# Patient Record
Sex: Female | Born: 1950 | Race: Asian | Hispanic: No | Marital: Married | Smoking: Never smoker
Health system: Southern US, Community
[De-identification: ages and names within clinical notes are randomized; demographics above are authoritative.]

## PROBLEM LIST (undated history)

## (undated) DIAGNOSIS — E119 Type 2 diabetes mellitus without complications: Secondary | ICD-10-CM

## (undated) DIAGNOSIS — Z789 Other specified health status: Secondary | ICD-10-CM

## (undated) HISTORY — DX: Other specified health status: Z78.9

---

## 2003-10-27 ENCOUNTER — Emergency Department (HOSPITAL_COMMUNITY): Admission: EM | Admit: 2003-10-27 | Discharge: 2003-10-27 | Payer: Self-pay | Admitting: Emergency Medicine

## 2003-12-30 ENCOUNTER — Inpatient Hospital Stay (HOSPITAL_COMMUNITY): Admission: AD | Admit: 2003-12-30 | Discharge: 2003-12-30 | Payer: Self-pay | Admitting: Obstetrics and Gynecology

## 2004-01-03 ENCOUNTER — Ambulatory Visit: Payer: Self-pay | Admitting: Obstetrics & Gynecology

## 2013-01-08 ENCOUNTER — Encounter (HOSPITAL_COMMUNITY): Payer: Self-pay | Admitting: Emergency Medicine

## 2013-01-08 ENCOUNTER — Emergency Department (INDEPENDENT_AMBULATORY_CARE_PROVIDER_SITE_OTHER): Payer: 59

## 2013-01-08 ENCOUNTER — Emergency Department (HOSPITAL_COMMUNITY)
Admission: EM | Admit: 2013-01-08 | Discharge: 2013-01-08 | Disposition: A | Discharge status: Home / Self Care | Payer: 59 | Source: Home / Self Care | Attending: Family Medicine | Admitting: Family Medicine

## 2013-01-08 DIAGNOSIS — J40 Bronchitis, not specified as acute or chronic: Secondary | ICD-10-CM

## 2013-01-08 MED ORDER — ALBUTEROL SULFATE (5 MG/ML) 0.5% IN NEBU
INHALATION_SOLUTION | RESPIRATORY_TRACT | Status: AC
  Filled 2013-01-08: qty 1

## 2013-01-08 MED ORDER — IPRATROPIUM BROMIDE 0.02 % IN SOLN
RESPIRATORY_TRACT | Status: AC
  Filled 2013-01-08: qty 2.5

## 2013-01-08 MED ORDER — AZITHROMYCIN 250 MG PO TABS: 250.0000 mg | ORAL_TABLET | Freq: Every day | ORAL | Status: DC

## 2013-01-08 MED ORDER — ALBUTEROL SULFATE (5 MG/ML) 0.5% IN NEBU: 2.5000 mg | INHALATION_SOLUTION | Freq: Once | RESPIRATORY_TRACT | Status: DC

## 2013-01-08 MED ORDER — IPRATROPIUM BROMIDE 0.02 % IN SOLN: 0.5000 mg | Freq: Once | RESPIRATORY_TRACT | Status: DC

## 2013-01-08 NOTE — ED Notes (Signed)
C/o productive cough. Sob. Sore throat. States "hard to swallow".runny nose. Chills.  Denies fever, n/v/d. No relief with otc meds.  On set 2 wks ago.

## 2013-01-08 NOTE — Discharge Instructions (Signed)

## 2013-01-08 NOTE — ED Provider Notes (Signed)
CSN: [id number]     Arrival date & time 01/08/13  7829 History   First MD Initiated Contact with Patient 01/08/13 [phone]     Chief Complaint  Patient presents with  . Influenza   (Consider location/radiation/quality/duration/timing/severity/associated sxs/prior Treatment) Patient is a 62 y.o. female presenting with flu symptoms. The history is provided by the patient. No language interpreter was used.  Influenza Presenting symptoms: cough, fever and myalgias   Severity:  Moderate Onset quality:  Gradual Duration:  3 weeks Progression:  Worsening Chronicity:  New Relieved by:  Nothing Worsened by:  Nothing tried Ineffective treatments:  None tried Associated symptoms: chills     History reviewed. No pertinent past medical history. History reviewed. No pertinent past surgical history. History reviewed. No pertinent family history. History  Substance Use Topics  . Smoking status: Never Smoker   . Smokeless tobacco: Not on file  . Alcohol Use: No   OB History   Grav Para Term Preterm Abortions TAB SAB Ect Mult Living                 Review of Systems  Constitutional: Positive for fever and chills.  Respiratory: Positive for cough.   Musculoskeletal: Positive for myalgias.  All other systems reviewed and are negative.    Allergies  Review of patient's allergies indicates no known allergies.  Home Medications  No current outpatient prescriptions on file. BP 125/74  Pulse 95  Temp(Src) 97.5 F (36.4 C) (Oral)  SpO2 95% Physical Exam  Nursing note and vitals reviewed. Constitutional: She is oriented to person, place, and time. She appears well-developed and well-nourished.  HENT:  Head: Normocephalic.  Right Ear: External ear normal.  Nose: Nose normal.  Mouth/Throat: Oropharynx is clear and moist.  Eyes: Conjunctivae and EOM are normal. Pupils are equal, round, and reactive to light.  Neck: Normal range of motion. Neck supple.  Cardiovascular: Normal rate,  regular rhythm and normal heart sounds.   Pulmonary/Chest: Breath sounds normal. She is in respiratory distress.  Abdominal: Soft.  Musculoskeletal: Normal range of motion.  Neurological: She is alert and oriented to person, place, and time. She has normal reflexes.  Skin: Skin is warm.  Psychiatric: She has a normal mood and affect.    ED Course  Procedures (including critical care time) Labs Review Labs Reviewed - No data to display Imaging Review Dg Chest 2 View  01/08/2013   CLINICAL DATA:  Cough, congestion.  EXAM: CHEST  2 VIEW  COMPARISON:  None.  FINDINGS: The heart size and mediastinal contours are within normal limits. Both lungs are clear. No pneumothorax or pleural effusion is noted. The visualized skeletal structures are unremarkable.  IMPRESSION: No active cardiopulmonary disease.   Electronically Signed   By: Roque Lias M.D.   On: 01/08/2013 10:02    EKG Interpretation    Date/Time:    Ventricular Rate:    PR Interval:    QRS Duration:   QT Interval:    QTC Calculation:   R Axis:     Text Interpretation:              MDM   1. Bronchitis    Pt's chest xray is normal.  I will treat for bronchitis.  Pt given zithromax.   I advised primary care follow up   Elson Areas, PA-C 01/08/13 1016

## 2013-01-09 NOTE — ED Provider Notes (Signed)
Medical screening examination/treatment/procedure(s) were performed by a resident physician or non-physician practitioner and as the supervising physician I was immediately available for consultation/collaboration.  Clementeen Graham, MD   Rodolph Bong, MD 01/09/13 [phone]

## 2014-09-17 ENCOUNTER — Ambulatory Visit (INDEPENDENT_AMBULATORY_CARE_PROVIDER_SITE_OTHER): Payer: 59 | Admitting: Internal Medicine

## 2014-09-17 VITALS — BP 122/70 | HR 73 | Temp 98.2°F | Ht 58.5 in | Wt 118.6 lb

## 2014-09-17 DIAGNOSIS — M79675 Pain in left toe(s): Secondary | ICD-10-CM | POA: Diagnosis not present

## 2014-09-17 DIAGNOSIS — B351 Tinea unguium: Secondary | ICD-10-CM

## 2014-09-17 DIAGNOSIS — L409 Psoriasis, unspecified: Secondary | ICD-10-CM

## 2014-09-17 DIAGNOSIS — M79674 Pain in right toe(s): Secondary | ICD-10-CM

## 2014-09-17 LAB — COMPREHENSIVE METABOLIC PANEL
ALT: 9 U/L (ref 6–29)
AST: 17 U/L (ref 10–35)
Albumin: 4 g/dL (ref 3.6–5.1)
Alkaline Phosphatase: 69 U/L (ref 33–130)
BUN: 12 mg/dL (ref 7–25)
CHLORIDE: 105 mmol/L (ref 98–110)
CO2: 26 mmol/L (ref 20–31)
Calcium: 9 mg/dL (ref 8.6–10.4)
Creat: 0.74 mg/dL (ref 0.50–0.99)
Glucose, Bld: 87 mg/dL (ref 65–99)
POTASSIUM: 3.7 mmol/L (ref 3.5–5.3)
Sodium: 140 mmol/L (ref 135–146)
TOTAL PROTEIN: 7.1 g/dL (ref 6.1–8.1)
Total Bilirubin: 0.7 mg/dL (ref 0.2–1.2)

## 2014-09-17 LAB — POCT CBC
GRANULOCYTE PERCENT: 60.3 % (ref 37–80)
HEMATOCRIT: 41.9 % (ref 37.7–47.9)
Hemoglobin: 12.9 g/dL (ref 12.2–16.2)
Lymph, poc: 1.6 (ref 0.6–3.4)
MCH, POC: 25.6 pg — AB (ref 27–31.2)
MCHC: 30.9 g/dL — AB (ref 31.8–35.4)
MCV: 82.9 fL (ref 80–97)
MID (cbc): 0.4 (ref 0–0.9)
MPV: 6.9 fL (ref 0–99.8)
POC Granulocyte: 3.1 (ref 2–6.9)
POC LYMPH PERCENT: 30.9 %L (ref 10–50)
POC MID %: 8.8 %M (ref 0–12)
Platelet Count, POC: 234 10*3/uL (ref 142–424)
RBC: 5.05 M/uL (ref 4.04–5.48)
RDW, POC: 14.7 %
WBC: 5.1 10*3/uL (ref 4.6–10.2)

## 2014-09-17 LAB — POCT SEDIMENTATION RATE: POCT SED RATE: 51 mm/h — AB (ref 0–22)

## 2014-09-17 LAB — GLUCOSE, POCT (MANUAL RESULT ENTRY): POC Glucose: 83 mg/dl (ref 70–99)

## 2014-09-17 LAB — TSH: TSH: 2.784 u[IU]/mL (ref 0.350–4.500)

## 2014-09-17 MED ORDER — FLUOCINOLONE ACETONIDE 0.01 % EX SHAM: MEDICATED_SHAMPOO | CUTANEOUS | Status: DC

## 2014-09-17 MED ORDER — CLOBETASOL PROPIONATE 0.05 % EX CREA: 1.0000 "application " | TOPICAL_CREAM | Freq: Two times a day (BID) | CUTANEOUS | Status: DC

## 2014-09-17 MED ORDER — TERBINAFINE HCL 250 MG PO TABS: 250.0000 mg | ORAL_TABLET | Freq: Every day | ORAL | Status: DC

## 2014-09-17 NOTE — Progress Notes (Signed)
Subjective:  This chart was scribed for Jessica Lin MD, by Jessica Gregory, at [facility].  This patient was seen in room 10 and the patient's care was started at 9:42 AM.    Patient ID: Jessica Gregory, female    DOB: 1950/12/05, 64 y.o.   MRN: [id number]  Chief Complaint  Patient presents with  . Nail Problem    c/o toenail pain and fungus in the nails for several years  . Rash    x's 1 year that itches (patches) in multiple places    HPI  HPI Comments: Jessica Gregory is a 64 y.o. female who presents to the [facility] complaining of a rash on her back, thighs and lower portion of her legs onset 1 year ago.  She is also complaining of toe pain due to her toe finger nails growing upwards.   Patient is not able to wear tennis shoes as it gives her extreme pain.  She was not able to come in to see a physicain earlier due to lack of insurance and has not seen a provider in over 10 years.  She has now chosen Dr. Edilia Gregory as her PCP and will be seeing her in the coming months.   Patient has an interpreter present with her today.     There are no active problems to display for this patient.  No past medical history on file. No past surgical history on file. No Known Allergies Prior to Admission medications   Medication Sig Start Date End Date Taking? Authorizing Provider  azithromycin (ZITHROMAX) 250 MG tablet Take 1 tablet (250 mg total) by mouth daily. Take first 2 tablets together, then 1 every day until finished. Patient not taking: Reported on 09/17/2014 01/08/13   Jessica Meadow, PA-C   Social History   Social History  . Marital Status: Married    Spouse Name: N/A  . Number of Children: N/A  . Years of Education: N/A   Occupational History  . Not on file.   Social History Main Topics  . Smoking status: Never Smoker   . Smokeless tobacco: Not on file  . Alcohol Use: No  . Drug Use: No  . Sexual Activity: No   Other Topics  Concern  . Not on file   Social History Narrative     Review of Systems  Constitutional: Negative for fever and chills.  Respiratory: Negative for cough, choking and shortness of breath.   Musculoskeletal: Negative for neck pain and neck stiffness.  Skin: Positive for rash.       Objective:   Physical Exam  Constitutional: She appears well-developed and well-nourished. No distress.  HENT:  Head: Normocephalic and atraumatic.  Eyes: Conjunctivae and EOM are normal. Pupils are equal, round, and reactive to light.  Neck: Normal range of motion. Neck supple. No thyromegaly present.  Cardiovascular: Normal rate, regular rhythm and normal heart sounds.   No murmur heard. Pulmonary/Chest: Effort normal and breath sounds normal. No respiratory distress.  Skin: Skin is warm and dry.  She has nail deformities on right 1,2 and 4 and left 1,3 and 4 , leuconychia , all the nails on the toes are destroyed and thickened.   She has five or six large circular lesions three to five cm diameter with sharply demarcated borders and raised scale like silver surfaces.      Psychiatric: She has a normal mood and affect. Her behavior is normal.  Nursing note and vitals  reviewed.  Filed Vitals:   09/17/14 0919  BP: 122/70  Pulse: 73  Temp: 98.2 F (36.8 C)  TempSrc: Oral  Height: 4' 10.5" (1.486 m)  Weight: 118 lb 9.6 oz (53.797 kg)  SpO2: 96%   Results for orders placed or performed in visit on 09/17/14  POCT CBC  Result Value Ref Range   WBC 5.1 4.6 - 10.2 K/uL   Lymph, poc 1.6 0.6 - 3.4   POC LYMPH PERCENT 30.9 10 - 50 %L   MID (cbc) 0.4 0 - 0.9   POC MID % 8.8 0 - 12 %M   POC Granulocyte 3.1 2 - 6.9   Granulocyte percent 60.3 37 - 80 %G   RBC 5.05 4.04 - 5.48 M/uL   Hemoglobin 12.9 12.2 - 16.2 g/dL   HCT, POC 41.9 37.7 - 47.9 %   MCV 82.9 80 - 97 fL   MCH, POC 25.6 (A) 27 - 31.2 pg   MCHC 30.9 (A) 31.8 - 35.4 g/dL   RDW, POC 14.7 %   Platelet Count, POC 234 142 - 424 K/uL    MPV 6.9 0 - 99.8 fL  POCT glucose (manual entry)  Result Value Ref Range   POC Glucose 83 70 - 99 mg/dl          Assessment & Plan:  I have completed the patient encounter in its entirety as documented by the scribe, with editing by me where necessary. Jessica Gregory P. Laney Pastor, M.D.   Onychomycosis due to dermatophyte with pain - Plan: Lamisil//ref podiatry  Psoriasis - Plan: steroids  Meds ordered this encounter  Medications  . clobetasol cream (TEMOVATE) 0.05 %    Sig: Apply 1 application topically 2 (two) times daily.    Dispense:  30 g    Refill:  1  . terbinafine (LAMISIL) 250 MG tablet    Sig: Take 1 tablet (250 mg total) by mouth daily.    Dispense:  30 tablet    Refill:  2  . Fluocinolone Acetonide 0.01 % SHAM    Sig: Use as shampoo twice a week    Dispense:  120 mL    Refill:  0   Ref podiatry ESR 51 CMET wnl TSH wnl F/u for CPE as planned

## 2014-09-18 NOTE — Addendum Note (Signed)
Addended by: Leandrew Koyanagi on: 09/18/2014 09:18 PM   Modules accepted: Level of Service

## 2014-10-21 ENCOUNTER — Ambulatory Visit (INDEPENDENT_AMBULATORY_CARE_PROVIDER_SITE_OTHER): Payer: 59 | Admitting: Podiatry

## 2014-10-21 ENCOUNTER — Encounter: Payer: Self-pay | Admitting: Podiatry

## 2014-10-21 ENCOUNTER — Ambulatory Visit (INDEPENDENT_AMBULATORY_CARE_PROVIDER_SITE_OTHER): Payer: 59

## 2014-10-21 VITALS — BP 162/97 | HR 72 | Resp 18

## 2014-10-21 DIAGNOSIS — B351 Tinea unguium: Secondary | ICD-10-CM

## 2014-10-21 DIAGNOSIS — S92002A Unspecified fracture of left calcaneus, initial encounter for closed fracture: Secondary | ICD-10-CM

## 2014-10-21 DIAGNOSIS — M79672 Pain in left foot: Secondary | ICD-10-CM | POA: Diagnosis not present

## 2014-10-21 DIAGNOSIS — R52 Pain, unspecified: Secondary | ICD-10-CM | POA: Diagnosis not present

## 2014-10-21 DIAGNOSIS — Z79899 Other long term (current) drug therapy: Secondary | ICD-10-CM

## 2014-10-21 NOTE — Patient Instructions (Signed)
Over the counter medication for nail fungus is called fungi-nail  Terbinafine oral granules What is this medicine? TERBINAFINE (TER bin a feen) is an antifungal medicine. It is used to treat certain kinds of fungal or yeast infections. This medicine may be used for other purposes; ask your health care provider or pharmacist if you have questions. COMMON BRAND NAME(S): Lamisil What should I tell my health care provider before I take this medicine? They need to know if you have any of these conditions: -drink alcoholic beverages -kidney disease -liver disease -an unusual or allergic reaction to Terbinafine, other medicines, foods, dyes, or preservatives -pregnant or trying to get pregnant -breast-feeding How should I use this medicine? Take this medicine by mouth. Follow the directions on the prescription label. Hold packet with cut line on top. Shake packet gently to settle contents. Tear packet open along cut line, or use scissors to cut across line. Carefully pour the entire contents of packet onto a spoonful of a soft food, such as pudding or other soft, non-acidic food such as mashed potatoes (do NOT use applesauce or a fruit-based food). If two packets are required for each dose, you may either sprinkle the content of both packets on one spoonful of non-acidic food, or sprinkle the contents of both packets on two spoonfuls of non-acidic food. Make sure that no granules remain in the packet. Swallow the mxiture of the food and granules without chewing. Take your medicine at regular intervals. Do not take it more often than directed. Take all of your medicine as directed even if you think you are better. Do not skip doses or stop your medicine early. Contact your pediatrician or health care professional regarding the use of this medicine in children. While this medicine may be prescribed for children as young as 4 years for selected conditions, precautions do apply. Overdosage: If you think you  have taken too much of this medicine contact a poison control center or emergency room at once. NOTE: This medicine is only for you. Do not share this medicine with others. What if I miss a dose? If you miss a dose, take it as soon as you can. If it is almost time for your next dose, take only that dose. Do not take double or extra doses. What may interact with this medicine? Do not take this medicine with any of the following medications: -thioridazine This medicine may also interact with the following medications: -beta-blockers -caffeine -cimetidine -cyclosporine -MAOIs like Carbex, Eldepryl, Marplan, Nardil, and Parnate -medicines for fungal infections like fluconazole and ketoconazole -medicines for irregular heartbeat like amiodarone, flecainide and propafenone -rifampin -SSRIs like citalopram, escitalopram, fluoxetine, fluvoxamine, paroxetine and sertraline -tricyclic antidepressants like amitriptyline, clomipramine, desipramine, imipramine, nortriptyline, and others -warfarin This list may not describe all possible interactions. Give your health care provider a list of all the medicines, herbs, non-prescription drugs, or dietary supplements you use. Also tell them if you smoke, drink alcohol, or use illegal drugs. Some items may interact with your medicine. What should I watch for while using this medicine? Your doctor may monitor your liver function. Tell your doctor right away if you have nausea or vomiting, loss of appetite, stomach pain on your right upper side, yellow skin, dark urine, light stools, or are over tired. You need to take this medicine for 6 weeks or longer to cure the fungal infection. Take your medicine regularly for as long as your doctor or health care professional tells you to. What side effects may I  notice from receiving this medicine? Side effects that you should report to your doctor or health care professional as soon as possible: -allergic reactions like  skin rash or hives, swelling of the face, lips, or tongue -change in vision -dark urine -fever or infection -general ill feeling or flu-like symptoms -light-colored stools -loss of appetite, nausea -redness, blistering, peeling or loosening of the skin, including inside the mouth -right upper belly pain -unusually weak or tired -yellowing of the eyes or skin Side effects that usually do not require medical attention (report to your doctor or health care professional if they continue or are bothersome): -changes in taste -diarrhea -hair loss -muscle or joint pain -stomach upset This list may not describe all possible side effects. Call your doctor for medical advice about side effects. You may report side effects to FDA at 1-800-FDA-1088. Where should I keep my medicine? Keep out of the reach of children. Store at room temperature between 15 and 30 degrees C (59 and 86 degrees F). Throw away any unused medicine after the expiration date. NOTE: This sheet is a summary. It may not cover all possible information. If you have questions about this medicine, talk to your doctor, pharmacist, or health care provider.  2015, Elsevier/Gold Standard. (2007-03-27 17:25:48)   Stress Fracture When too much stress is put on the foot, as in running and jumping sports, the center shaft of the bones of the forefoot is very susceptible to stress fractures (break in bone). This is because of repetitive stress on the bone. This injury is more common if osteoporosis is present or if inadequate running shoes are used. Rapid increase in running distances are often the cause. Running distances should be gradually increased to avoid this problem. Shoes should be used which adequately cushion the foot. Shoes should absorb the shocks of the activity.  DIAGNOSIS  Usually the diagnosis is made by history. The foot progressively becomes sorer with activities. X-rays may be negative (show no break) within the first 2 to  3 weeks of the beginning of pain. A later X-ray may show signs of healing bone (callus formation). A bone scan or MRI will usually make the diagnosis earlier. TREATMENT AND HOME CARE INSTRUCTIONS  Treatment may or may not include a cast, removable fracture boot, or walking shoe. Casts are used for short periods of time to prevent muscle atrophy (muscle wasting).  Activities should be stopped until further advised by your caregiver.  Wear shoes with adequate shock absorbing abilities.  Alternative exercise may be undertaken while waiting for healing. These may include bicycling and swimming, or as your caregiver suggests. If you do not have a cast or splint:  You may walk on your injured foot as tolerated or advised.  Do not put any weight on your injured foot for as long as directed by your caregiver. Slowly increase the amount of time you walk on the foot as the pain allows or as advised.  Use crutches until you can bear weight without pain. A gradual increase in weight bearing may help.  Apply ice to the injury for 15-20 minutes each hour while awake for the first 2 days. Put the ice in a plastic bag and place a towel between the bag of ice and your skin.  Only take over-the-counter or prescription medicines for pain, discomfort, or fever as directed by your caregiver. SEEK IMMEDIATE MEDICAL CARE IF:   Pain is becoming worse rather than better, or if pain is uncontrolled with medications.  You  have increased swelling or redness in the foot. MAKE SURE YOU:   Understand these instructions.  Will watch your condition.  Will get help right away if you are not doing well or get worse. Document Released: 01/12/2000 Document Revised: 04/08/2011 Document Reviewed: 11/10/2007 [facility] Patient Information 2015 [place], [place]. This information is not intended to replace advice given to you by your health care provider. Make sure you discuss any questions you have with your health care  provider.

## 2014-10-21 NOTE — Progress Notes (Signed)
Subjective:    Patient ID: Jessica Gregory, female    DOB: 1950/05/05, 64 y.o.   MRN: [id number]  HPI  64 year old female presents the office with concerns of thick, discolored toenails which has been present for several years. She recently went to urgent care and was prescribed Lamisil approximate one month ago. She is always finish her first month of treatment. She does not know dose any side effects the medication however she has not noticed any change in nails either. Denies any pain to the toenails at this time although they do become painful with shoe gear. Denies any redness or drainage.  Chest states that she's had some swelling to her feet mostly on the left heel. She works on her feet all day. She states that she has pain with weightbearing to her left heel. She appears to have pain in her right heel but now is mostly over the left side. She denies any recent injury or trauma. Denies any numbness or tingling. She said no prior treatment. No other complaints at this time.    Review of Systems  Eyes: Positive for redness, itching and visual disturbance.  Respiratory: Positive for cough.   Gastrointestinal: Positive for constipation.  Musculoskeletal: Positive for gait problem.  Skin: Positive for rash.       Change in nails and thick scars  Neurological: Positive for light-headedness.  Hematological: Bruises/bleeds easily.  All other systems reviewed and are negative.      Objective:   Physical Exam AAO x3, NAD; presents with daughter who translates. DP/PT pulses palpable bilaterally, CRT less than 3 seconds Protective sensation intact with Simms Weinstein monofilament, vibratory sensation intact, Achilles tendon reflex intact Nails are hypertrophic, dystrophic, discolored, brittle, elongated 10. There is no swelling erythema or drainage. There is mild tenderness to palpation upon the nails 10. There is tenderness to palpation on the lateral aspect of the left calcaneus and  there is pain with lateral compression of the calcaneus. There is pain with vibratory sensation overlying this area as well. There is localized edema overlying the lateral portion of the calcaneus on the left side. There is no assistive erythema or increase in warmth. There is no defect noted or any step-offs. There is no pain on the plantar aspect of the calcaneus or along the course of the plantar fascia at this time. There is no pain of the right foot/ankle this time. No edema to the right foot. No other areas of tenderness to bilateral lower extremities. MMT 5/5, ROM WNL.  No open lesions or pre-ulcerative lesions.  No overlying edema, erythema, increase in warmth to bilateral lower extremities.  No pain with calf compression, swelling, warmth, erythema bilaterally.       Assessment & Plan:  64 year old female with left heel pain, likely calcaneal fracture/stress fracture; onychomycosis -X-rays were obtained and reviewed with the patient. On the left side there does appear to be a radiolucent line within the posterior tubercle of the calcaneus concerning for fracture. Given that she has pinpoint tenderness overlying this area with pain with vibratory sensation was treated for fracture. Cam boot dispensed for mobilization. Ice and elevation. Wear boot at all times. - In regards to onychomycosis continue with Lamisil. Over bloodwork for LFT and CBC w/diff to have done within the next 2 weeks. I encouraged her to get this blood work done for continuation of treatment with Lamisil. Discussed side effects of the medication directed to call the office should any occur. -Follow-up 3 weeks or sooner  if any problems arise. In the meantime, encouraged to call the office with any questions, concerns, change in symptoms.   Celesta Gentile, DPM

## 2014-10-26 ENCOUNTER — Telehealth: Payer: Self-pay | Admitting: Family Medicine

## 2014-10-26 NOTE — Telephone Encounter (Signed)
Left a message for patient to return call to schedule a mammogram. 

## 2014-11-09 ENCOUNTER — Ambulatory Visit: Payer: 59 | Admitting: Family Medicine

## 2014-11-16 ENCOUNTER — Ambulatory Visit (INDEPENDENT_AMBULATORY_CARE_PROVIDER_SITE_OTHER): Payer: 59 | Admitting: Family Medicine

## 2014-11-16 ENCOUNTER — Encounter: Payer: Self-pay | Admitting: Family Medicine

## 2014-11-16 VITALS — BP 143/83 | HR 72 | Temp 97.8°F | Resp 16 | Wt 118.2 lb

## 2014-11-16 DIAGNOSIS — Z13220 Encounter for screening for lipoid disorders: Secondary | ICD-10-CM

## 2014-11-16 DIAGNOSIS — Z1239 Encounter for other screening for malignant neoplasm of breast: Secondary | ICD-10-CM

## 2014-11-16 DIAGNOSIS — Z7689 Persons encountering health services in other specified circumstances: Secondary | ICD-10-CM

## 2014-11-16 DIAGNOSIS — Z789 Other specified health status: Secondary | ICD-10-CM

## 2014-11-16 DIAGNOSIS — L409 Psoriasis, unspecified: Secondary | ICD-10-CM | POA: Diagnosis not present

## 2014-11-16 DIAGNOSIS — Z131 Encounter for screening for diabetes mellitus: Secondary | ICD-10-CM | POA: Diagnosis not present

## 2014-11-16 DIAGNOSIS — Z7189 Other specified counseling: Secondary | ICD-10-CM | POA: Diagnosis not present

## 2014-11-16 DIAGNOSIS — Z5181 Encounter for therapeutic drug level monitoring: Secondary | ICD-10-CM

## 2014-11-16 DIAGNOSIS — Z23 Encounter for immunization: Secondary | ICD-10-CM

## 2014-11-16 DIAGNOSIS — Z603 Acculturation difficulty: Secondary | ICD-10-CM | POA: Insufficient documentation

## 2014-11-16 DIAGNOSIS — Z119 Encounter for screening for infectious and parasitic diseases, unspecified: Secondary | ICD-10-CM

## 2014-11-16 DIAGNOSIS — R894 Abnormal immunological findings in specimens from other organs, systems and tissues: Secondary | ICD-10-CM

## 2014-11-16 DIAGNOSIS — R768 Other specified abnormal immunological findings in serum: Secondary | ICD-10-CM

## 2014-11-16 HISTORY — DX: Other specified health status: Z78.9

## 2014-11-16 HISTORY — DX: Acculturation difficulty: Z60.3

## 2014-11-16 LAB — LIPID PANEL
Cholesterol: 193 mg/dL (ref 125–200)
HDL: 43 mg/dL — ABNORMAL LOW (ref >=46)
LDL Cholesterol: 134 mg/dL — ABNORMAL HIGH (ref <130)
Total CHOL/HDL Ratio: 4.5 Ratio (ref <=5.0)
Triglycerides: 80 mg/dL (ref <150)
VLDL: 16 mg/dL (ref <30)

## 2014-11-16 LAB — HEPATIC FUNCTION PANEL
ALBUMIN: 3.8 g/dL (ref 3.6–5.1)
ALT: 10 U/L (ref 6–29)
AST: 16 U/L (ref 10–35)
Alkaline Phosphatase: 74 U/L (ref 33–130)
Bilirubin, Direct: 0.1 mg/dL (ref <=0.2)
Indirect Bilirubin: 0.2 mg/dL (ref 0.2–1.2)
Total Bilirubin: 0.3 mg/dL (ref 0.2–1.2)
Total Protein: 7.3 g/dL (ref 6.1–8.1)

## 2014-11-16 LAB — HEMOGLOBIN A1C
Hgb A1c MFr Bld: 6.5 % — ABNORMAL HIGH (ref <5.7)
MEAN PLASMA GLUCOSE: 140 mg/dL — AB (ref <117)

## 2014-11-16 MED ORDER — CLOBETASOL PROPIONATE 0.05 % EX CREA: 1.0000 "application " | TOPICAL_CREAM | Freq: Two times a day (BID) | CUTANEOUS | Status: DC

## 2014-11-16 NOTE — Patient Instructions (Addendum)
I will be in touch in your labs asap You got your flu shot and tetanus shot today I would recommend that you wear the fracture boot as Dr. Jacqualyn Posey ordered to help your left heel bone heal Please see me for a physical in 4-6 months  I will set up a mammogram for you- I do recommend that you have this screening for breast cancer  Results for orders placed or performed in visit on 09/17/14  Comprehensive metabolic panel  Result Value Ref Range   Sodium 140 135 - 146 mmol/L   Potassium 3.7 3.5 - 5.3 mmol/L   Chloride 105 98 - 110 mmol/L   CO2 26 20 - 31 mmol/L   Glucose, Bld 87 65 - 99 mg/dL   BUN 12 7 - 25 mg/dL   Creat 0.74 0.50 - 0.99 mg/dL   Total Bilirubin 0.7 0.2 - 1.2 mg/dL   Alkaline Phosphatase 69 33 - 130 U/L   AST 17 10 - 35 U/L   ALT 9 6 - 29 U/L   Total Protein 7.1 6.1 - 8.1 g/dL   Albumin 4.0 3.6 - 5.1 g/dL   Calcium 9.0 8.6 - 10.4 mg/dL  TSH  Result Value Ref Range   TSH 2.784 0.350 - 4.500 uIU/mL  POCT CBC  Result Value Ref Range   WBC 5.1 4.6 - 10.2 K/uL   Lymph, poc 1.6 0.6 - 3.4   POC LYMPH PERCENT 30.9 10 - 50 %L   MID (cbc) 0.4 0 - 0.9   POC MID % 8.8 0 - 12 %M   POC Granulocyte 3.1 2 - 6.9   Granulocyte percent 60.3 37 - 80 %G   RBC 5.05 4.04 - 5.48 M/uL   Hemoglobin 12.9 12.2 - 16.2 g/dL   HCT, POC 41.9 37.7 - 47.9 %   MCV 82.9 80 - 97 fL   MCH, POC 25.6 (A) 27 - 31.2 pg   MCHC 30.9 (A) 31.8 - 35.4 g/dL   RDW, POC 14.7 %   Platelet Count, POC 234 142 - 424 K/uL   MPV 6.9 0 - 99.8 fL  POCT glucose (manual entry)  Result Value Ref Range   POC Glucose 83 70 - 99 mg/dl  POCT SEDIMENTATION RATE  Result Value Ref Range   POCT SED RATE 51 (A) 0 - 22 mm/hr

## 2014-11-16 NOTE — Progress Notes (Addendum)
[facility] [address], [place] [address] [phone]  Date:  11/16/2014   Name:  Jessica Gregory   DOB:  09-Dec-1950   MRN:  [id number]  PCP:  Lamar Blinks, MD    Chief Complaint: establish care with Dr Lorelei Pont; bloodwork; and foot fungus   History of Present Illness:  Jessica Gregory is a 64 y.o. very pleasant female patient who presents with the following:  She was here a couple of month ago with complaint of toenail fungus and rash.  She was started on lamisil and was referred to podiatry. She feels like her toenails are a little bit better- they have seen podiatry and was dx with a stress fracture.  She was given a boot to wear, but per her daughter she does not tend to use the boot much as it is awkward when she is working   Her today with "Jessica Gregory)," her daughter.    She would like to have a tetanus shot today- they do not know when her last shot may have been  She has not been seeing doctors for a few years for regular care.   She is fasting today for labs- did have some labs done per Dr. Laney Pastor in August She also has some scattered plaque psoriasis- she has been using clobetasol with good results.     There are no active problems to display for this patient.   No past medical history on file.  No past surgical history on file.  Social History  Substance Use Topics  . Smoking status: Never Smoker   . Smokeless tobacco: None  . Alcohol Use: No    No family history on file.  No Known Allergies  Medication list has been reviewed and updated.  Current Outpatient Prescriptions on File Prior to Visit  Medication Sig Dispense Refill  . clobetasol cream (TEMOVATE) 4.81 % Apply 1 application topically 2 (two) times daily. 30 g 1  . Fluocinolone Acetonide 0.01 % SHAM Use as shampoo twice a week 120 mL 0  . terbinafine (LAMISIL) 250 MG tablet Take 1 tablet (250 mg total) by mouth daily. 30 tablet 2   No current facility-administered  medications on file prior to visit.    Review of Systems:  As per HPI- otherwise negative.   Physical Examination: Filed Vitals:   11/16/14 1035  BP: 143/83  Pulse: 72  Temp: 97.8 F (36.6 C)  Resp: 16   Filed Vitals:   11/16/14 1035  Weight: 118 lb 3.2 oz (53.615 kg)   Body mass index is 24.28 kg/(m^2). Ideal Body Weight:    GEN: WDWN, NAD, Non-toxic, A & O x 3 HEENT: Atraumatic, Normocephalic. Neck supple. No masses, No LAD. Ears and Nose: No external deformity. CV: RRR, No M/G/R. No JVD. No thrill. No extra heart sounds. PULM: CTA B, no wheezes, crackles, rhonchi. No retractions. No resp. distress. No accessory muscle use. ABD: S, NT, ND, +BS. No rebound. No HSM. EXTR: No c/c/e NEURO Normal gait.  PSYCH: Normally interactive. Conversant. Not depressed or anxious appearing.  Calm demeanor.  She has thickened, fungal appearing nails on most of her toes. Also many of her fingernails show evidence of onycholysis.   She has a few discrete, silvery scaled plaques on her body typical of psoriasis  BP Readings from Last 3 Encounters:  11/16/14 143/83  10/21/14 162/97  09/17/14 122/70     Assessment and Plan: Establishing care with new doctor, encounter for  Need for  prophylactic vaccination and inoculation against influenza - Plan: Flu Vaccine QUAD 36+ mos IM  Screening for breast cancer - Plan: MM Digital Screening  Screening for hyperlipidemia - Plan: Lipid panel  Screening for diabetes mellitus - Plan: Hemoglobin A1c  Immunization due - Plan: Tdap vaccine greater than or equal to 7yo IM  Screening examination for infectious disease - Plan: HIV antibody, Hepatitis panel, acute  Psoriasis - Plan: clobetasol cream (TEMOVATE) 0.05 %  tdap and flu shots today Cholesterol, diabetes and hepatitis, HIV screening today Refilled her clobetasol for psoriasis Her BP is slightly high today but was normal at last check here Continue lamisil for her onycholysis and  onychomycosis.   She apparently has a stress fracture in her left heel.  Recommended that she use the CAM boot as recommended by her podiatrist.  She has follow-up with him this month  RTC for CPE soon  Signed Lamar Blinks, MD  Called on 10/23- spoke with her daughter Jessica.  Her mom may have a chronic Hep B infection or may have had this in the past and cleared it.  She will bring her in for a follow-up blood draw in the next couple of weeks Cholesterol and liver function are reasonable.  However she does just barely have DM.  Encouraged her to work on exercise and a diet with more veggies and protein, fewer carbs, recheck in 3 months.   Results for orders placed or performed in visit on 11/16/14  Lipid panel  Result Value Ref Range   Cholesterol 193 125 - 200 mg/dL   Triglycerides 80 <150 mg/dL   HDL 43 (L) >=46 mg/dL   Total CHOL/HDL Ratio 4.5 <=5.0 Ratio   VLDL 16 <30 mg/dL   LDL Cholesterol 134 (H) <130 mg/dL  Hemoglobin A1c  Result Value Ref Range   Hgb A1c MFr Bld 6.5 (H) <5.7 %   Mean Plasma Glucose 140 (H) <117 mg/dL  HIV antibody  Result Value Ref Range   HIV 1&2 Ab, 4th Generation NONREACTIVE NONREACTIVE  Hepatitis panel, acute  Result Value Ref Range   Hepatitis B Surface Ag POSITIVE (A) NEGATIVE   HCV Ab NEGATIVE NEGATIVE   Hep B C IgM NON REACTIVE NON REACTIVE   Hep A IgM NON REACTIVE NON REACTIVE  Hepatic Function Panel  Result Value Ref Range   Total Bilirubin 0.3 0.2 - 1.2 mg/dL   Bilirubin, Direct 0.1 <=0.2 mg/dL   Indirect Bilirubin 0.2 0.2 - 1.2 mg/dL   Alkaline Phosphatase 74 33 - 130 U/L   AST 16 10 - 35 U/L   ALT 10 6 - 29 U/L   Total Protein 7.3 6.1 - 8.1 g/dL   Albumin 3.8 3.6 - 5.1 g/dL  Hepatitis B Surf Ag Confirmation  Result Value Ref Range   Hepatitis B Surf Ag Confirmation POS (A) NEGATIVE

## 2014-11-17 LAB — HIV ANTIBODY (ROUTINE TESTING W REFLEX): HIV: NONREACTIVE

## 2014-11-17 LAB — HEPATITIS PANEL, ACUTE
HCV Ab: NEGATIVE
Hep A IgM: NONREACTIVE
Hep B C IgM: NONREACTIVE
Hepatitis B Surface Ag: POSITIVE — AB

## 2014-11-17 LAB — HEPATITIS B SURF AG CONFIRMATION: Hepatitis B Surf Ag Confirmation: POSITIVE — AB

## 2014-11-20 ENCOUNTER — Encounter: Payer: Self-pay | Admitting: Family Medicine

## 2014-11-20 NOTE — Addendum Note (Signed)
Addended by: Lamar Blinks C on: 11/20/2014 03:25 PM   Modules accepted: Orders

## 2014-11-21 ENCOUNTER — Encounter: Payer: Self-pay | Admitting: Family Medicine

## 2014-11-21 ENCOUNTER — Encounter: Payer: Self-pay | Admitting: Podiatry

## 2014-11-21 ENCOUNTER — Ambulatory Visit (INDEPENDENT_AMBULATORY_CARE_PROVIDER_SITE_OTHER): Payer: 59 | Admitting: Podiatry

## 2014-11-21 ENCOUNTER — Ambulatory Visit (INDEPENDENT_AMBULATORY_CARE_PROVIDER_SITE_OTHER): Payer: 59

## 2014-11-21 DIAGNOSIS — S92002D Unspecified fracture of left calcaneus, subsequent encounter for fracture with routine healing: Secondary | ICD-10-CM

## 2014-11-21 DIAGNOSIS — M79672 Pain in left foot: Secondary | ICD-10-CM

## 2014-11-21 DIAGNOSIS — M722 Plantar fascial fibromatosis: Secondary | ICD-10-CM | POA: Diagnosis not present

## 2014-11-21 DIAGNOSIS — B351 Tinea unguium: Secondary | ICD-10-CM | POA: Diagnosis not present

## 2014-11-21 DIAGNOSIS — E1165 Type 2 diabetes mellitus with hyperglycemia: Secondary | ICD-10-CM | POA: Insufficient documentation

## 2014-11-21 DIAGNOSIS — IMO0002 Reserved for concepts with insufficient information to code with codable children: Secondary | ICD-10-CM | POA: Insufficient documentation

## 2014-11-21 NOTE — Patient Instructions (Signed)

## 2014-11-21 NOTE — Progress Notes (Signed)
Patient ID: Jessica Gregory, female   DOB: November 05, 1950, 64 y.o.   MRN: [id number]  Subjective: Patient presents the office today for evaluation of possible stress fracture the left calcaneus. She states that she continues with a cam boot. She does continue to have some pain around the area in the same spot she did previously. The majority of her pain is the outside aspect of the heel, side. She does is difficult to work in the boot although she does. She stands for long periods of time at work. She denies any swelling or redness to the area. Denies any redness or tingling. She is also continue with Lamisil and she has had blood work on her primary care physician's office. No other complaints at this time. Denies side effects of Lamisil.  Objective: AAO 3, NAD DP/PT 2/4, CRT less than 3 seconds There is continuation tenderness to lateral aspect of the left calcaneus and there is tenderness directly upon the calcaneus. There can be pain with vibratory sensation along the lateral aspect of the calcaneus. There is no pain on the plantar aspect of the calcaneus on the plantar fascia this time. There is no pain on the course of the Achilles tendon. There is mild discomfort on the plantar medial tubercle of the calcaneus at the insertion of the plantar fashion on the right side. There is no pain lateral compression of the calcaneus and the right side. No pain on the Achilles tendon. Nails continue be hypertrophic, dystrophic, discolored, brittle. There is no tenderness palpation on the nails 1-5 bilaterally. There is no surrounding erythema or drainage. No other areas of tenderness to bilateral lower extremity. There is no pain with calf compression, swelling, warmth, erythema.  Assessment: 64 year old female with possible left calcaneal fracture/stress fracture, right plantar fasciitis, onychomycosis  Plan: Recommend continuing the CAM boot on the left side. Would start stretching exercises on the right side.  Ice and elevation. Continue Lamisil for onychomycosis. Again discussed side effects and directed to stop if any are to occur. Follow-up with her primary care physician. Follow-up in the next couple weeks for reevaluation or sooner if any palms are to arise. In the meantime call the office with any questions, concerns, change in symptoms. *X-ray left foot next appointment  Celesta Gentile, DPM

## 2014-11-22 ENCOUNTER — Other Ambulatory Visit: Payer: Self-pay

## 2014-11-22 DIAGNOSIS — Z1231 Encounter for screening mammogram for malignant neoplasm of breast: Secondary | ICD-10-CM

## 2015-01-02 ENCOUNTER — Ambulatory Visit: Payer: 59 | Admitting: Podiatry

## 2015-02-17 ENCOUNTER — Encounter: Payer: Self-pay | Admitting: Family Medicine

## 2015-02-22 ENCOUNTER — Encounter: Payer: Self-pay | Admitting: Family Medicine

## 2015-03-01 ENCOUNTER — Ambulatory Visit (INDEPENDENT_AMBULATORY_CARE_PROVIDER_SITE_OTHER): Payer: 59 | Admitting: Family Medicine

## 2015-03-01 ENCOUNTER — Encounter: Payer: Self-pay | Admitting: Family Medicine

## 2015-03-01 VITALS — BP 120/80 | HR 77 | Temp 97.4°F | Resp 16 | Ht 58.75 in | Wt 118.6 lb

## 2015-03-01 DIAGNOSIS — Z130 Encounter for screening for diseases of the blood and blood-forming organs and certain disorders involving the immune mechanism: Secondary | ICD-10-CM

## 2015-03-01 DIAGNOSIS — B169 Acute hepatitis B without delta-agent and without hepatic coma: Secondary | ICD-10-CM | POA: Diagnosis not present

## 2015-03-01 DIAGNOSIS — B199 Unspecified viral hepatitis without hepatic coma: Secondary | ICD-10-CM | POA: Insufficient documentation

## 2015-03-01 DIAGNOSIS — E119 Type 2 diabetes mellitus without complications: Secondary | ICD-10-CM

## 2015-03-01 DIAGNOSIS — Z0000 Encounter for general adult medical examination without abnormal findings: Secondary | ICD-10-CM | POA: Diagnosis not present

## 2015-03-01 DIAGNOSIS — B1910 Unspecified viral hepatitis B without hepatic coma: Secondary | ICD-10-CM

## 2015-03-01 DIAGNOSIS — Z23 Encounter for immunization: Secondary | ICD-10-CM

## 2015-03-01 LAB — CBC
HEMATOCRIT: 40.3 % (ref 36.0–46.0)
Hemoglobin: 13.4 g/dL (ref 12.0–15.0)
MCH: 27.6 pg (ref 26.0–34.0)
MCHC: 33.3 g/dL (ref 30.0–36.0)
MCV: 83.1 fL (ref 78.0–100.0)
MPV: 9.4 fL (ref 8.6–12.4)
Platelets: 257 10*3/uL (ref 150–400)
RBC: 4.85 MIL/uL (ref 3.87–5.11)
RDW: 14.2 % (ref 11.5–15.5)
WBC: 6.1 10*3/uL (ref 4.0–10.5)

## 2015-03-01 LAB — HEMOGLOBIN A1C
Hgb A1c MFr Bld: 6.7 % — ABNORMAL HIGH (ref <5.7)
Mean Plasma Glucose: 146 mg/dL — ABNORMAL HIGH (ref <117)

## 2015-03-01 LAB — MICROALBUMIN, URINE: Microalb, Ur: 0.6 mg/dL

## 2015-03-01 NOTE — Patient Instructions (Addendum)
Please call and schedule a mammogram.  There are several options in town, one good choice is: [facility] ?  Address: [address], [place], [place] [address]  [phone]  I am going to refer you to a liver doctor to talk about your hepatitis B and help [place] determine if any treatment is needed at this time.  However be reassured that your liver function is still normal and you have likely had this infection for a very long time  I will be in touch with your labs- you do have mild diabetes.  If needed we can start medication.  Otherwise I would encourage you to stay active and to eat fewer sweets, breads, rice, noodles.    You do need a pap- we are glad to do this for you at your convenience.  Please do see me to have this done soon!  There are a lot of things to get done and I don't want it to be overwhelming.  However I do recommend that you have an eye exam and a colonoscopy at some point soon  It would be my pleasure to continue to see you as a patient at my new office, starting the last week of February.  If you prefer to remain at [facility] one of my partners will be happy to see you here.   [facility] [address] [place], [place] [address] Phone: [phone]

## 2015-03-01 NOTE — Progress Notes (Signed)
[facility] [address], [place] [address] [phone]  Date:  03/01/2015   Name:  Jessica Gregory   DOB:  [birth year]   MRN:  [id number]  PCP:  Lamar Blinks, MD    Chief Complaint: Annual Exam   History of Present Illness:  Jessica Gregory is a 65 y.o. very pleasant female patient who presents with the following:  History of well controlled DM- here today for a CPE.  Her adult daughter has been helping her catch up on her health care as she does not speak English which makes things harder for her Her most recent labs showed that she is likely a chronic Hep B carrier.  This is news to them- I will refer her to hepatology to evaluate her.  Her daughter is with her today - she reports that she has been tested and is negative  Recent liver function tests were quite normal and they have never had any concerns about her liver health   Lab Results  Component Value Date   HGBA1C 6.5* 11/16/2014     Patient Active Problem List   Diagnosis Date Noted  . Diabetes mellitus type 2, controlled ([place]) 11/21/2014  . Language barrier 11/16/2014    Past Medical History  Diagnosis Date  . Language barrier 11/16/2014    From [place], does not yet speak English    No past surgical history on file.  Social History  Substance Use Topics  . Smoking status: Never Smoker   . Smokeless tobacco: Not on file  . Alcohol Use: No    No family history on file.  No Known Allergies  Medication list has been reviewed and updated.  Current Outpatient Prescriptions on File Prior to Visit  Medication Sig Dispense Refill  . clobetasol cream (TEMOVATE) [id number] % Apply 1 application topically 2 (two) times daily. 30 g 1  . Fluocinolone Acetonide 0.01 % SHAM Use as shampoo twice a week 120 mL 0  . terbinafine (LAMISIL) 250 MG tablet Take 1 tablet (250 mg total) by mouth daily. 30 tablet 2   No current facility-administered medications on file prior to visit.    Review of  Systems:  As per HPI- otherwise negative.   Physical Examination: Filed Vitals:   03/01/15 0839  BP: 120/80  Pulse: 77  Temp: 97.4 F (36.3 C)  Resp: 16   Filed Vitals:   03/01/15 0839  Height: 4' 10.75" (1.492 m)  Weight: 118 lb 9.6 oz (53.797 kg)   Body mass index is 24.17 kg/(m^2). Ideal Body Weight: Weight in (lb) to have BMI = 25: 122.5  GEN: WDWN, NAD, Non-toxic, A & O x 3, looks well, does not speak English HEENT: Atraumatic, Normocephalic. Neck supple. No masses, No LAD.  Bilateral TM wnl, oropharynx normal.  PEERL,EOMI.   Ears and Nose: No external deformity. CV: RRR, No M/G/R. No JVD. No thrill. No extra heart sounds. PULM: CTA B, no wheezes, crackles, rhonchi. No retractions. No resp. distress. No accessory muscle use. ABD: S, NT, ND, +BS. No rebound. No HSM. EXTR: No c/c/e NEURO Normal gait.  PSYCH: Normally interactive. Conversant. Not depressed or anxious appearing.  Calm demeanor.  Pt declines a breast exam or pap today  Assessment and Plan: Physical exam  Controlled type 2 diabetes mellitus without complication, without long-term current use of insulin (HCC) - Plan: Microalbumin, urine, Pneumococcal polysaccharide vaccine 23-valent greater than or equal to 2yo subcutaneous/IM, Hemoglobin A1c  Hepatitis B virus infection, unspecified chronicity -  Plan: Amb Referral to Hepatology  Screening for deficiency anemia - Plan: CBC  Encouraged a pap today but pt does not want to have this sone Pneumovax today A1c Referral to hepatology Will plan further follow- up pending labs.   Signed Lamar Blinks, MD

## 2015-03-02 ENCOUNTER — Encounter: Payer: Self-pay | Admitting: Family Medicine

## 2015-03-02 ENCOUNTER — Other Ambulatory Visit: Payer: Self-pay | Admitting: Family Medicine

## 2015-03-02 DIAGNOSIS — B1910 Unspecified viral hepatitis B without hepatic coma: Secondary | ICD-10-CM

## 2016-08-01 ENCOUNTER — Ambulatory Visit (INDEPENDENT_AMBULATORY_CARE_PROVIDER_SITE_OTHER): Payer: 59 | Admitting: Family Medicine

## 2016-08-01 ENCOUNTER — Encounter: Payer: Self-pay | Admitting: Family Medicine

## 2016-08-01 VITALS — BP 147/86 | HR 78 | Temp 97.4°F | Resp 16 | Ht 58.5 in | Wt 115.6 lb

## 2016-08-01 DIAGNOSIS — L409 Psoriasis, unspecified: Secondary | ICD-10-CM

## 2016-08-01 DIAGNOSIS — Z1211 Encounter for screening for malignant neoplasm of colon: Secondary | ICD-10-CM | POA: Diagnosis not present

## 2016-08-01 DIAGNOSIS — B181 Chronic viral hepatitis B without delta-agent: Secondary | ICD-10-CM

## 2016-08-01 DIAGNOSIS — IMO0001 Reserved for inherently not codable concepts without codable children: Secondary | ICD-10-CM

## 2016-08-01 DIAGNOSIS — R21 Rash and other nonspecific skin eruption: Secondary | ICD-10-CM | POA: Diagnosis not present

## 2016-08-01 DIAGNOSIS — Z1239 Encounter for other screening for malignant neoplasm of breast: Secondary | ICD-10-CM

## 2016-08-01 DIAGNOSIS — Z0000 Encounter for general adult medical examination without abnormal findings: Secondary | ICD-10-CM

## 2016-08-01 DIAGNOSIS — Z1231 Encounter for screening mammogram for malignant neoplasm of breast: Secondary | ICD-10-CM

## 2016-08-01 DIAGNOSIS — E1165 Type 2 diabetes mellitus with hyperglycemia: Secondary | ICD-10-CM | POA: Diagnosis not present

## 2016-08-01 LAB — COMPREHENSIVE METABOLIC PANEL
A/G RATIO: 1.3 (ref 1.2–2.2)
ALBUMIN: 3.8 g/dL (ref 3.6–4.8)
ALT: 15 IU/L (ref 0–32)
AST: 13 IU/L (ref 0–40)
Alkaline Phosphatase: 80 IU/L (ref 39–117)
BILIRUBIN TOTAL: 0.4 mg/dL (ref 0.0–1.2)
BUN / CREAT RATIO: 18 (ref 12–28)
BUN: 13 mg/dL (ref 8–27)
CHLORIDE: 103 mmol/L (ref 96–106)
CO2: 25 mmol/L (ref 20–29)
Calcium: 8.9 mg/dL (ref 8.7–10.3)
Creatinine, Ser: 0.71 mg/dL (ref 0.57–1.00)
GFR, EST AFRICAN AMERICAN: 104 mL/min/{1.73_m2} (ref >59)
GFR, EST NON AFRICAN AMERICAN: 90 mL/min/{1.73_m2} (ref >59)
GLUCOSE: 156 mg/dL — AB (ref 65–99)
Globulin, Total: 3 g/dL (ref 1.5–4.5)
Potassium: 3.6 mmol/L (ref 3.5–5.2)
Sodium: 142 mmol/L (ref 134–144)
TOTAL PROTEIN: 6.8 g/dL (ref 6.0–8.5)

## 2016-08-01 LAB — LIPID PANEL
Chol/HDL Ratio: 4.2 ratio (ref 0.0–4.4)
Cholesterol, Total: 217 mg/dL — ABNORMAL HIGH (ref 100–199)
HDL: 52 mg/dL (ref >39)
LDL Calculated: 141 mg/dL — ABNORMAL HIGH (ref 0–99)
TRIGLYCERIDES: 118 mg/dL (ref 0–149)
VLDL Cholesterol Cal: 24 mg/dL (ref 5–40)

## 2016-08-01 LAB — POCT GLYCOSYLATED HEMOGLOBIN (HGB A1C): Hemoglobin A1C: 11.7

## 2016-08-01 MED ORDER — CANAGLIFLOZIN 100 MG PO TABS: 100.0000 mg | ORAL_TABLET | Freq: Every day | ORAL | 1 refills | Status: AC

## 2016-08-01 MED ORDER — METFORMIN HCL 500 MG PO TABS: 500.0000 mg | ORAL_TABLET | Freq: Two times a day (BID) | ORAL | 1 refills | Status: AC

## 2016-08-01 MED ORDER — LIDOCAINE-EPINEPHRINE (PF) 1 %-1:200000 IJ SOLN: 2.0000 mL | Freq: Once | INTRAMUSCULAR | Status: AC

## 2016-08-01 MED ORDER — CLOBETASOL PROPIONATE 0.05 % EX CREA: 1.0000 "application " | TOPICAL_CREAM | Freq: Two times a day (BID) | CUTANEOUS | 1 refills | Status: AC

## 2016-08-01 NOTE — Assessment & Plan Note (Signed)
Discussed that her diabetes has deteriorated. In 2017 she was diet controlled but her sugar consumption has been increased.  Will start metformin and invokana Pt referred to diabetic education. Follow up in one month.  Given that pt did not even believe she was diabetic she will need education.

## 2016-08-01 NOTE — Assessment & Plan Note (Signed)
Will await punch biopsy but likely psoriasis.  Since patient is now uncontrolled diabetic will do topical steroids only and referral to Dermatology.

## 2016-08-01 NOTE — Progress Notes (Signed)
Chief Complaint  Patient presents with  . Annual Exam    Subjective:  Jessica Gregory is a 66 y.o. female here for a health maintenance visit.  Patient is established pt  Her daughter is translating.  Skin lesion She has a diffuse itchy plaque like rash that started in 2016 She reports that the rash is spreading and is all over her scalp and torso, limbs, groin She has not been to a dermatologist   Uncontrolled diabetes She reports that she did not believe she was diabetic and was not taking any medication.  She reports that she eats 2 meals a day and drinks about 5 sodas daily. Mostly fanta, sunkist or sprite.  She works nights in a packing job. She has increase urination and thirst.  Patient Active Problem List   Diagnosis Date Noted  . Diffuse papular rash 08/01/2016  . Psoriasis 08/01/2016  . Chronic hepatitis B ([place]) 08/01/2016  . Hepatitis, viral 03/01/2015  . Controlled type 2 diabetes mellitus without complication, without long-term current use of insulin ([place]) 03/01/2015  . Uncontrolled diabetes mellitus ([place]) 11/21/2014  . Language barrier 11/16/2014    Past Medical History:  Diagnosis Date  . Language barrier 11/16/2014   From [place], does not yet speak English    No past surgical history on file.   Outpatient Medications Prior to Visit  Medication Sig Dispense Refill  . clobetasol cream (TEMOVATE) 4.00 % Apply 1 application topically 2 (two) times daily. (Patient not taking: Reported on 03/01/2015) 30 g 1  . Fluocinolone Acetonide 0.01 % SHAM Use as shampoo twice a week (Patient not taking: Reported on 03/01/2015) 120 mL 0   No facility-administered medications prior to visit.     No Known Allergies   No family history on file.   Health Habits: Dental Exam: up to date Eye Exam: up to date Exercise: 0 times/week on average Current exercise activities: walking/running Diet: lots of soda  Social History   Social History  . Marital status: Married   Spouse name: N/A  . Number of children: N/A  . Years of education: N/A   Occupational History  . work at [employer] Topics  . Smoking status: Never Smoker  . Smokeless tobacco: Never Used  . Alcohol use No  . Drug use: No  . Sexual activity: No   Other Topics Concern  . Not on file   Social History Narrative   Single   Education: Other   Exercise: No   History  Alcohol Use No   History  Smoking Status  . Never Smoker  Smokeless Tobacco  . Never Used   History  Drug Use No    GYN: Sexual Health Menstrual status: regular menses LMP: No LMP recorded. Patient is postmenopausal. Last pap smear: see HM section History of abnormal pap smears: declines pap smear yearly   Health Maintenance: See under health Maintenance activity for review of completion dates as well. Immunization History  Administered Date(s) Administered  . Influenza,inj,Quad PF,36+ Mos 11/16/2014  . Pneumococcal Polysaccharide-23 03/01/2015  . Tdap 11/16/2014      Depression Screen-PHQ2/9 Depression screen [facility] 2/9 08/01/2016 03/01/2015 11/16/2014 09/17/2014  Decreased Interest - 0 0 0  Down, Depressed, Hopeless 0 0 0 0  PHQ - 2 Score 0 0 0 0       Depression Severity and Treatment Recommendations:  0-4= None  5-9= Mild / Treatment: Support, educate to call if worse; return in one month  10-14=  Moderate / Treatment: Support, watchful waiting; Antidepressant or Psycotherapy  15-19= Moderately severe / Treatment: Antidepressant OR Psychotherapy  >= 20 = Major depression, severe / Antidepressant AND Psychotherapy    Review of Systems   Review of Systems  Constitutional: Negative for chills, fever and weight loss.  Eyes: Negative for blurred vision and double vision.  Cardiovascular: Negative for chest pain and palpitations.  Gastrointestinal: Negative for abdominal pain, heartburn, nausea and vomiting.  Genitourinary: Negative for dysuria and urgency.  Neurological:  Negative for dizziness, tingling, tremors and headaches.    See HPI for ROS as well.    Objective:   Vitals:   08/01/16 0834  BP: (!) 147/86  Pulse: 78  Resp: 16  Temp: (!) 97.4 F (36.3 C)  TempSrc: Oral  SpO2: 94%  Weight: 115 lb 9.6 oz (52.4 kg)  Height: 4' 10.5" (1.486 m)   Diabetic Foot Exam - Simple   Simple Foot Form Visual Inspection No deformities, no ulcerations, no other skin breakdown bilaterally:  Yes Sensation Testing Intact to touch and monofilament testing bilaterally:  Yes Pulse Check Posterior Tibialis and Dorsalis pulse intact bilaterally:  Yes Comments     Body mass index is 23.75 kg/m.  Physical Exam  Constitutional: She is oriented to person, place, and time. She appears well-developed and well-nourished.  HENT:  Head: Normocephalic and atraumatic.  Eyes: Conjunctivae and EOM are normal.  Neck: Normal range of motion. Neck supple.  Cardiovascular: Normal rate, regular rhythm and normal heart sounds.   Pulmonary/Chest: Effort normal and breath sounds normal. No respiratory distress. She has no wheezes.  Abdominal: Soft. Bowel sounds are normal. She exhibits no distension and no mass. There is no tenderness. There is no rebound and no guarding.  Neurological: She is alert and oriented to person, place, and time. She has normal reflexes.  Skin:  Rash on torso, arms, pressure surfaces with salmon colored, silver patches on forearms.  Procedure performed:  Cleaned area and using 1% lidocaine with Epi able to perform a punch biopsy.   Breast: bilateral breast symmetric Right breast with inverted nipple No skin changes or LAD  Foot exam: thickened nails with gray color on all toes    Assessment/Plan:   Patient was seen for a health maintenance exam.  Counseled the patient on health maintenance issues. Reviewed her health mainteance schedule and ordered appropriate tests (see orders.) Counseled on regular exercise and weight management.  Recommend regular eye exams and dental cleaning.   The following issues were addressed today for health maintenance:   [place] was seen today for annual exam.  Diagnoses and all orders for this visit:  Encounter for health maintenance examination in adult Comments: Pt declined pap smear.  Agreeable to mammogram.  Not sure if she will get colonoscopy. Referral has been placed.  Diffuse papular rash -     Ambulatory referral to Dermatology -     Dermatology pathology -     POCT Skin KOH -     lidocaine-EPINEPHrine (XYLOCAINE-EPINEPHrine) 1 %-1:200000 (PF) injection 2 mL; Inject 2 mLs into the skin once.  Screening for breast cancer  Screen for colon cancer -     HM COLONOSCOPY  Uncontrolled type 2 diabetes mellitus without complication, without long-term current use of insulin (HCC) -     POCT glycosylated hemoglobin (Hb A1C) -     Lipid panel -     Comprehensive metabolic panel -     Microalbumin, urine -     Ambulatory referral  to diabetic education  Psoriasis -     clobetasol cream (TEMOVATE) 0.05 %; Apply 1 application topically 2 (two) times daily.  Chronic hepatitis B ([place])  Other orders -     metFORMIN (GLUCOPHAGE) 500 MG tablet; Take 1 tablet (500 mg total) by mouth 2 (two) times daily with a meal. -     canagliflozin (INVOKANA) 100 MG TABS tablet; Take 1 tablet (100 mg total) by mouth daily before breakfast.   Problem List Items Addressed This Visit      Digestive   Chronic hepatitis B ([place])     Endocrine   Uncontrolled diabetes mellitus ([place])    Discussed that her diabetes has deteriorated. In 2017 she was diet controlled but her sugar consumption has been increased.  Will start metformin and invokana Pt referred to diabetic education. Follow up in one month.  Given that pt did not even believe she was diabetic she will need education.      Relevant Medications   metFORMIN (GLUCOPHAGE) 500 MG tablet   canagliflozin (INVOKANA) 100 MG TABS tablet   Other  Relevant Orders   POCT glycosylated hemoglobin (Hb A1C) (Completed)   Lipid panel   Comprehensive metabolic panel   Microalbumin, urine   Ambulatory referral to diabetic education     Musculoskeletal and Integument   Diffuse papular rash   Relevant Medications   lidocaine-EPINEPHrine (XYLOCAINE-EPINEPHrine) 1 %-1:200000 (PF) injection 2 mL   Other Relevant Orders   Ambulatory referral to Dermatology   Dermatology pathology   POCT Skin KOH   Psoriasis    Will await punch biopsy but likely psoriasis.  Since patient is now uncontrolled diabetic will do topical steroids only and referral to Dermatology.      Relevant Medications   clobetasol cream (TEMOVATE) 0.05 %    Other Visit Diagnoses    Encounter for health maintenance examination in adult    -  Primary   Pt declined pap smear.  Agreeable to mammogram.  Not sure if she will get colonoscopy. Referral has been placed.   Screening for breast cancer       Screen for colon cancer       Relevant Orders   HM COLONOSCOPY (Completed)       Return in about 4 weeks (around 08/29/2016) for diabetes.    Body mass index is 23.75 kg/m.:  Discussed the patient's BMI with patient. The BMI body mass index is 23.75 kg/m.     Future Appointments Date Time Provider [place]  09/04/2016 9:20 AM Forrest Moron, MD PCP-PCP [facility]    Patient Instructions    Status:  Final result  Visible to patient:  No (Not Released)  Next appt:  None  Dx:  Screening examination for infectious ...   Ref Range & Units 39yr ago  Hepatitis B Surface Ag NEGATIVE POSITIVE    Comment: Confirmation results to follow.  HCV Ab NEGATIVE NEGATIVE   Hep B C IgM NON REACTIVE NON REACTIVE   Comment: High levels of Hepatitis B Core IgM antibody are detectable  during the acute stage of Hepatitis B. This antibody is used  to differentiate current from past HBV infection.     Hep A IgM NON REACTIVE NON REACTIVE   Comment:   Effective December 13, 2013, Hepatitis Acute Panel (test code [id number])  will be revised to automatically reflex to the Hepatitis C Viral RNA,  Quantitative, Real-Time PCR assay if the Hepatitis C antibody  screening result is Reactive. This  action is being taken to ensure  that the CDC/USPSTF recommended HCV diagnostic algorithm with the  appropriate test reflex needed for accurate interpretation is  followed.     Resulting Agency  [facility]  Narrative   Performed at: [address], [address]        [place], [place] [address]            IF you received an x-ray today, you will receive an invoice from [facility]. Please contact [facility] at [phone] with questions or concerns regarding your invoice.   IF you received labwork today, you will receive an invoice from [place]. Please contact [facility] at [phone] with questions or concerns regarding your invoice.   Our billing staff will not be able to assist you with questions regarding bills from these companies.  You will be contacted with the lab results as soon as they are available. The fastest way to get your results is to activate your My Chart account. Instructions are located on the last page of this paperwork. If you have not heard from [place] regarding the results in 2 weeks, please contact this office.    We recommend that you schedule a mammogram for breast cancer screening. Typically, you do not need a referral to do this. Please contact a local imaging center to schedule your mammogram.  [facility] - [phone]  *ask for the Radiology [place] ([place]) - [phone] or [phone]  [place] [phone] [place] [phone] [facility] - [phone]  *ask for the [facility] - [phone]  *ask for the Radiology Department [facility] - [phone]  *ask for the Mammography Department [place] - [phone]   Low Glycemic Foods (20-49)  Breakfast Cereals: All-Bran                All-Bran Fruit ' n Oats Fiber One               Oatmeal (not instant)  Oat bran Fruits and fruit juices: (Limit to 1-2 servings per day) Apples               Apricots (fresh & dried)  Blackberries            Blueberries Cherries                  Cranberries             Peaches                  Pears                       Plums                       Prunes Grapefruit                Raspberries            Strawberries           Tangerine Apple juice             Grapefruit juice Tomato juice  Beans and legumes (fresh-cooked): Black-eyed peas     Butter beans Chick peas              Lentils     Green  beans           Lima beans               Kidney beans          Navy beans  Non-starchy vegetables: Asparagus, bok choy, broccoli, cabbage, cauliflower, celery, cucumber, greens, lettuce, mushrooms, peppers, tomatoes, okra, onions, snow peas, spinach, summer squash  Grains: Barley                                Bulgur Rye                                    Wild rice  Nuts and oils : Almonds         Peanuts     Sunflower seeds  Hazelnuts      Pecans          Walnuts Oils that are liquid at room temperature  Dairy, fish, and meat: Milk, skim                         Lowfat cheese Yogurt, lowfat, fruit sugar sweetened Lean red meat                      Fish  Skinless chicken & [place]    Shellfish Moderate Glycemic Foods (50-69)  Breakfast Cereals: Bran Buds                             Bran Chex Just Right                            Mini-Wheats Special K         Swiss muesli  Fruits: Banana (under-ripe)             Dates Figs                                      Grapes Kiwi                                      Mango Oranges                               Raisins  Fruit Juices: Cranberry juice                     Orange juice  Beans and legumes: [place]-type baked beans Canned pinto, kidney, or navy beans Green peas  Vegetables: Beets                         Raw Carrots  Sweet potato              Yam Corn on the cob  Breads: Pita (pocket) bread          Oat bran bread Pumpernickel bread           Rye bread Wheat bread, high fiber       Grains: Cornmeal  Rice, brown   Rice, white                         Couscous Pasta: Macaroni                           Pizza  cheese Raviolimeat filled           Spaghetti, white        Nuts: Cashews                           Macadamia  Snacks: Chocolate                    Ice cream,lowfat  Muffin                               Popcorn High Glycemic Foods (70-100)   Breakfast Cereals: Cheerios                 Corn Chex Corn Flakes            Cream of Wheat Grape Nuts              Grape Nut Flakes Life                 Nutri-Grain       Puffed Rice               Puffed Wheat Rice Chex                 Rice Krispies Shredded Wheat             Team Total Fruits: Pineapple                 Watermelon Banana (over-ripe)  Beverages: Sodas, sweet tea, pineapple juice  Vegetables: Potato, baked, boiled, fried, mashed [place] fries Canned or frozen corn Cooked carrots Parsnips Winter squash  Breads: Most breads (white and whole grain) Bagels                     Bread sticks Bread stuffing          Kaiser roll Dinner rolls  Grains: Rice, instant          Tapioca, with milk  Candy and most cookies Snacks: Donuts                      Corn chips        Jelly beans                 Pretzels Pastries                             Restaurant and ethnic foods Most [place] food (sugar in stir fry or wok sauces) Teriyaki-style meats and vegetables

## 2016-08-01 NOTE — Patient Instructions (Addendum)
Status:  Final result  Visible to patient:  No (Not Released)  Next appt:  None  Dx:  Screening examination for infectious ...   Ref Range & Units 72yr ago  Hepatitis B Surface Ag NEGATIVE POSITIVE    Comment: Confirmation results to follow.  HCV Ab NEGATIVE NEGATIVE   Hep B C IgM NON REACTIVE NON REACTIVE   Comment: High levels of Hepatitis B Core IgM antibody are detectable  during the acute stage of Hepatitis B. This antibody is used  to differentiate current from past HBV infection.     Hep A IgM NON REACTIVE NON REACTIVE   Comment:   Effective December 13, 2013, Hepatitis Acute Panel (test code [id number])  will be revised to automatically reflex to the Hepatitis C Viral RNA,  Quantitative, Real-Time PCR assay if the Hepatitis C antibody  screening result is Reactive. This action is being taken to ensure  that the CDC/USPSTF recommended HCV diagnostic algorithm with the  appropriate test reflex needed for accurate interpretation is  followed.     Resulting Agency  [facility]  Narrative   Performed at: [address], [address]        [place], [place] [address]            IF you received an x-ray today, you will receive an invoice from [facility]. Please contact [facility] Radiology at [phone] with questions or concerns regarding your invoice.   IF you received labwork today, you will receive an invoice from [place]. Please contact [facility] at [phone] with questions or concerns regarding your invoice.   Our billing staff will not be able to assist you with questions regarding bills from these companies.  You will be contacted with the lab results as soon as they are available. The fastest way to get your results is to activate your My Chart account. Instructions are located on the last page of this paperwork. If you have not heard from [place] regarding the results in 2 weeks, please contact this office.    We  recommend that you schedule a mammogram for breast cancer screening. Typically, you do not need a referral to do this. Please contact a local imaging center to schedule your mammogram.  [facility] - [phone]  *ask for the Radiology [place] ([place]) - [phone] or [phone]  [facility] - [phone] [place] [phone] [facility] - [phone]  *ask for the [facility] - [phone]  *ask for the Radiology Department [facility] - [phone]  *ask for the [place] - [phone]   Low Glycemic Foods (20-49)  Breakfast Cereals: All-Bran                All-Bran Fruit ' n Oats Fiber One               Oatmeal (not instant)  Oat bran Fruits and fruit juices: (Limit to 1-2 servings per day) Apples               Apricots (fresh & dried)  Blackberries            Blueberries Cherries                  Cranberries             Peaches  Pears                       Plums                       Prunes Grapefruit                Raspberries            Strawberries           Tangerine Apple juice             Grapefruit juice Tomato juice  Beans and legumes (fresh-cooked): Black-eyed peas     Butter beans Chick peas              Lentils     Green beans           Lima beans               Kidney beans          Navy beans  Non-starchy vegetables: Asparagus, bok choy, broccoli, cabbage, cauliflower, celery, cucumber, greens, lettuce, mushrooms, peppers, tomatoes, okra, onions, snow peas, spinach, summer squash  Grains: Barley                                Bulgur Rye                                    Wild rice  Nuts and oils : Almonds         Peanuts     Sunflower seeds  Hazelnuts      Pecans          Walnuts Oils that are liquid at room temperature  Dairy, fish, and  meat: Milk, skim                         Lowfat cheese Yogurt, lowfat, fruit sugar sweetened Lean red meat                      Fish  Skinless chicken & [place]    Shellfish Moderate Glycemic Foods (50-69)  Breakfast Cereals: Bran Buds                             Bran Chex Just Right                            Mini-Wheats Special K         Swiss muesli  Fruits: Banana (under-ripe)             Dates Figs                                      Grapes Kiwi                                      Mango Oranges  Raisins  Fruit Juices: Cranberry juice                    Orange juice  Beans and legumes: [place]-type baked beans Canned pinto, kidney, or navy beans Green peas  Vegetables: Beets                         Raw Carrots  Sweet potato              Yam Corn on the cob  Breads: Pita (pocket) bread          Oat bran bread Pumpernickel bread           Rye bread Wheat bread, high fiber       Grains: Cornmeal                           Rice, brown   Rice, white                         Couscous Pasta: Macaroni                           Pizza  cheese Raviolimeat filled           Spaghetti, white        Nuts: Cashews                           Macadamia  Snacks: Chocolate                    Ice cream,lowfat  Muffin                               Popcorn High Glycemic Foods (70-100)   Breakfast Cereals: Cheerios                 Corn Chex Corn Flakes            Cream of Wheat Grape Nuts              Grape Nut Flakes Life                 Nutri-Grain       Puffed Rice               Puffed Wheat Rice Chex                 Rice Krispies Shredded Wheat             Team Total Fruits: Pineapple                 Watermelon Banana (over-ripe)  Beverages: Sodas, sweet tea, pineapple juice  Vegetables: Potato, baked, boiled, fried, mashed [place] fries Canned or frozen corn Cooked carrots Parsnips Winter squash  Breads: Most breads (white and  whole grain) Bagels                     Bread sticks Bread stuffing          Kaiser roll Dinner rolls  Grains: Rice, instant          Tapioca, with milk  Candy and most cookies Snacks: Donuts  Corn chips        Jelly beans                 Pretzels Pastries                             Restaurant and ethnic foods Most [place] food (sugar in stir fry or wok sauces) Teriyaki-style meats and vegetables

## 2016-08-02 LAB — MICROALBUMIN, URINE: Microalbumin, Urine: 99.1 ug/mL

## 2016-09-04 ENCOUNTER — Ambulatory Visit: Payer: 59 | Admitting: Family Medicine

## 2017-02-13 ENCOUNTER — Emergency Department (HOSPITAL_COMMUNITY): Payer: 59

## 2017-02-13 ENCOUNTER — Encounter (HOSPITAL_COMMUNITY): Payer: Self-pay | Admitting: Emergency Medicine

## 2017-02-13 ENCOUNTER — Ambulatory Visit (HOSPITAL_COMMUNITY)
Admission: RE | Admit: 2017-02-13 | Discharge: 2017-02-13 | Disposition: A | Discharge status: Home / Self Care | Payer: 59 | Source: Ambulatory Visit | Attending: Family Medicine | Admitting: Family Medicine

## 2017-02-13 ENCOUNTER — Other Ambulatory Visit: Payer: Self-pay

## 2017-02-13 ENCOUNTER — Telehealth: Payer: Self-pay | Admitting: Family Medicine

## 2017-02-13 ENCOUNTER — Inpatient Hospital Stay (HOSPITAL_COMMUNITY)
Admission: EM | Admit: 2017-02-13 | Discharge: 2017-03-28 | DRG: 374 | Disposition: E | Payer: 59 | Attending: Hematology and Oncology | Admitting: Hematology and Oncology

## 2017-02-13 ENCOUNTER — Ambulatory Visit (INDEPENDENT_AMBULATORY_CARE_PROVIDER_SITE_OTHER): Payer: Medicare Other | Admitting: Family Medicine

## 2017-02-13 ENCOUNTER — Encounter: Payer: Self-pay | Admitting: Family Medicine

## 2017-02-13 VITALS — BP 120/86 | HR 132 | Temp 97.6°F | Resp 16 | Ht 58.5 in | Wt 111.8 lb

## 2017-02-13 DIAGNOSIS — Z1231 Encounter for screening mammogram for malignant neoplasm of breast: Secondary | ICD-10-CM

## 2017-02-13 DIAGNOSIS — C7800 Secondary malignant neoplasm of unspecified lung: Secondary | ICD-10-CM

## 2017-02-13 DIAGNOSIS — R197 Diarrhea, unspecified: Secondary | ICD-10-CM | POA: Diagnosis not present

## 2017-02-13 DIAGNOSIS — J9600 Acute respiratory failure, unspecified whether with hypoxia or hypercapnia: Secondary | ICD-10-CM

## 2017-02-13 DIAGNOSIS — K5909 Other constipation: Secondary | ICD-10-CM

## 2017-02-13 DIAGNOSIS — I10 Essential (primary) hypertension: Secondary | ICD-10-CM | POA: Diagnosis present

## 2017-02-13 DIAGNOSIS — J189 Pneumonia, unspecified organism: Secondary | ICD-10-CM | POA: Diagnosis present

## 2017-02-13 DIAGNOSIS — C7989 Secondary malignant neoplasm of other specified sites: Secondary | ICD-10-CM | POA: Insufficient documentation

## 2017-02-13 DIAGNOSIS — I700 Atherosclerosis of aorta: Secondary | ICD-10-CM | POA: Diagnosis present

## 2017-02-13 DIAGNOSIS — M6250 Muscle wasting and atrophy, not elsewhere classified, unspecified site: Secondary | ICD-10-CM | POA: Diagnosis present

## 2017-02-13 DIAGNOSIS — J9811 Atelectasis: Secondary | ICD-10-CM | POA: Diagnosis present

## 2017-02-13 DIAGNOSIS — C786 Secondary malignant neoplasm of retroperitoneum and peritoneum: Secondary | ICD-10-CM | POA: Insufficient documentation

## 2017-02-13 DIAGNOSIS — K909 Intestinal malabsorption, unspecified: Secondary | ICD-10-CM

## 2017-02-13 DIAGNOSIS — J90 Pleural effusion, not elsewhere classified: Secondary | ICD-10-CM

## 2017-02-13 DIAGNOSIS — R1030 Lower abdominal pain, unspecified: Secondary | ICD-10-CM

## 2017-02-13 DIAGNOSIS — E872 Acidosis: Secondary | ICD-10-CM | POA: Diagnosis present

## 2017-02-13 DIAGNOSIS — E871 Hypo-osmolality and hyponatremia: Secondary | ICD-10-CM

## 2017-02-13 DIAGNOSIS — T402X5A Adverse effect of other opioids, initial encounter: Secondary | ICD-10-CM | POA: Diagnosis not present

## 2017-02-13 DIAGNOSIS — E876 Hypokalemia: Secondary | ICD-10-CM

## 2017-02-13 DIAGNOSIS — Z23 Encounter for immunization: Secondary | ICD-10-CM | POA: Diagnosis not present

## 2017-02-13 DIAGNOSIS — C799 Secondary malignant neoplasm of unspecified site: Secondary | ICD-10-CM | POA: Diagnosis not present

## 2017-02-13 DIAGNOSIS — Z66 Do not resuscitate: Secondary | ICD-10-CM | POA: Diagnosis present

## 2017-02-13 DIAGNOSIS — R9389 Abnormal findings on diagnostic imaging of other specified body structures: Secondary | ICD-10-CM

## 2017-02-13 DIAGNOSIS — R062 Wheezing: Secondary | ICD-10-CM

## 2017-02-13 DIAGNOSIS — C7801 Secondary malignant neoplasm of right lung: Secondary | ICD-10-CM | POA: Diagnosis present

## 2017-02-13 DIAGNOSIS — C787 Secondary malignant neoplasm of liver and intrahepatic bile duct: Secondary | ICD-10-CM | POA: Diagnosis present

## 2017-02-13 DIAGNOSIS — B370 Candidal stomatitis: Secondary | ICD-10-CM

## 2017-02-13 DIAGNOSIS — T502X5A Adverse effect of carbonic-anhydrase inhibitors, benzothiadiazides and other diuretics, initial encounter: Secondary | ICD-10-CM | POA: Diagnosis not present

## 2017-02-13 DIAGNOSIS — I891 Lymphangitis: Secondary | ICD-10-CM | POA: Diagnosis present

## 2017-02-13 DIAGNOSIS — R1032 Left lower quadrant pain: Secondary | ICD-10-CM | POA: Diagnosis not present

## 2017-02-13 DIAGNOSIS — E1165 Type 2 diabetes mellitus with hyperglycemia: Secondary | ICD-10-CM

## 2017-02-13 DIAGNOSIS — R140 Abdominal distension (gaseous): Secondary | ICD-10-CM

## 2017-02-13 DIAGNOSIS — J9622 Acute and chronic respiratory failure with hypercapnia: Secondary | ICD-10-CM | POA: Diagnosis not present

## 2017-02-13 DIAGNOSIS — N390 Urinary tract infection, site not specified: Secondary | ICD-10-CM | POA: Diagnosis present

## 2017-02-13 DIAGNOSIS — Z1239 Encounter for other screening for malignant neoplasm of breast: Secondary | ICD-10-CM

## 2017-02-13 DIAGNOSIS — R410 Disorientation, unspecified: Secondary | ICD-10-CM

## 2017-02-13 DIAGNOSIS — R110 Nausea: Secondary | ICD-10-CM

## 2017-02-13 DIAGNOSIS — C762 Malignant neoplasm of abdomen: Secondary | ICD-10-CM

## 2017-02-13 DIAGNOSIS — E8770 Fluid overload, unspecified: Secondary | ICD-10-CM | POA: Diagnosis not present

## 2017-02-13 DIAGNOSIS — K56609 Unspecified intestinal obstruction, unspecified as to partial versus complete obstruction: Secondary | ICD-10-CM

## 2017-02-13 DIAGNOSIS — C801 Malignant (primary) neoplasm, unspecified: Secondary | ICD-10-CM

## 2017-02-13 DIAGNOSIS — E43 Unspecified severe protein-calorie malnutrition: Secondary | ICD-10-CM | POA: Diagnosis present

## 2017-02-13 DIAGNOSIS — E860 Dehydration: Secondary | ICD-10-CM | POA: Diagnosis present

## 2017-02-13 DIAGNOSIS — R180 Malignant ascites: Secondary | ICD-10-CM | POA: Diagnosis present

## 2017-02-13 DIAGNOSIS — R509 Fever, unspecified: Secondary | ICD-10-CM

## 2017-02-13 DIAGNOSIS — A419 Sepsis, unspecified organism: Secondary | ICD-10-CM

## 2017-02-13 DIAGNOSIS — G8929 Other chronic pain: Secondary | ICD-10-CM

## 2017-02-13 DIAGNOSIS — T192XXA Foreign body in vulva and vagina, initial encounter: Secondary | ICD-10-CM | POA: Diagnosis present

## 2017-02-13 DIAGNOSIS — Z5111 Encounter for antineoplastic chemotherapy: Secondary | ICD-10-CM

## 2017-02-13 DIAGNOSIS — K5900 Constipation, unspecified: Secondary | ICD-10-CM

## 2017-02-13 DIAGNOSIS — Z6822 Body mass index (BMI) 22.0-22.9, adult: Secondary | ICD-10-CM

## 2017-02-13 DIAGNOSIS — E785 Hyperlipidemia, unspecified: Secondary | ICD-10-CM | POA: Diagnosis not present

## 2017-02-13 DIAGNOSIS — J9601 Acute respiratory failure with hypoxia: Secondary | ICD-10-CM

## 2017-02-13 DIAGNOSIS — R0602 Shortness of breath: Secondary | ICD-10-CM

## 2017-02-13 DIAGNOSIS — K5903 Drug induced constipation: Secondary | ICD-10-CM | POA: Diagnosis not present

## 2017-02-13 DIAGNOSIS — R159 Full incontinence of feces: Secondary | ICD-10-CM | POA: Diagnosis not present

## 2017-02-13 DIAGNOSIS — R188 Other ascites: Secondary | ICD-10-CM

## 2017-02-13 DIAGNOSIS — Z515 Encounter for palliative care: Secondary | ICD-10-CM | POA: Diagnosis present

## 2017-02-13 DIAGNOSIS — F05 Delirium due to known physiological condition: Secondary | ICD-10-CM | POA: Diagnosis present

## 2017-02-13 DIAGNOSIS — G893 Neoplasm related pain (acute) (chronic): Secondary | ICD-10-CM

## 2017-02-13 DIAGNOSIS — C569 Malignant neoplasm of unspecified ovary: Secondary | ICD-10-CM | POA: Diagnosis present

## 2017-02-13 DIAGNOSIS — R1013 Epigastric pain: Secondary | ICD-10-CM

## 2017-02-13 DIAGNOSIS — J9621 Acute and chronic respiratory failure with hypoxia: Secondary | ICD-10-CM | POA: Diagnosis not present

## 2017-02-13 DIAGNOSIS — E875 Hyperkalemia: Secondary | ICD-10-CM | POA: Diagnosis not present

## 2017-02-13 DIAGNOSIS — F419 Anxiety disorder, unspecified: Secondary | ICD-10-CM | POA: Diagnosis present

## 2017-02-13 DIAGNOSIS — C7802 Secondary malignant neoplasm of left lung: Secondary | ICD-10-CM | POA: Diagnosis present

## 2017-02-13 DIAGNOSIS — R109 Unspecified abdominal pain: Secondary | ICD-10-CM

## 2017-02-13 DIAGNOSIS — R000 Tachycardia, unspecified: Secondary | ICD-10-CM | POA: Diagnosis not present

## 2017-02-13 DIAGNOSIS — G9349 Other encephalopathy: Secondary | ICD-10-CM | POA: Diagnosis not present

## 2017-02-13 DIAGNOSIS — R64 Cachexia: Secondary | ICD-10-CM | POA: Diagnosis present

## 2017-02-13 DIAGNOSIS — K560 Paralytic ileus: Secondary | ICD-10-CM | POA: Diagnosis not present

## 2017-02-13 DIAGNOSIS — E878 Other disorders of electrolyte and fluid balance, not elsewhere classified: Secondary | ICD-10-CM

## 2017-02-13 HISTORY — DX: Type 2 diabetes mellitus without complications: E11.9

## 2017-02-13 LAB — CBC
HCT: 44.4 % (ref 36.0–46.0)
HEMOGLOBIN: 14.4 g/dL (ref 12.0–15.0)
MCH: 26.5 pg (ref 26.0–34.0)
MCHC: 32.4 g/dL (ref 30.0–36.0)
MCV: 81.8 fL (ref 78.0–100.0)
Platelets: 323 10*3/uL (ref 150–400)
RBC: 5.43 MIL/uL — ABNORMAL HIGH (ref 3.87–5.11)
RDW: 13.4 % (ref 11.5–15.5)
WBC: 15.4 10*3/uL — ABNORMAL HIGH (ref 4.0–10.5)

## 2017-02-13 LAB — POCT CBC
GRANULOCYTE PERCENT: 11 % — AB (ref 37–80)
HEMATOCRIT: 45.5 % (ref 37.7–47.9)
Hemoglobin: 14.8 g/dL (ref 12.2–16.2)
Lymph, poc: 9.4 — AB (ref 0.6–3.4)
MCH, POC: 26.3 pg — AB (ref 27–31.2)
MCHC: 32.4 g/dL (ref 31.8–35.4)
MCV: 81 fL (ref 80–97)
MID (CBC): 86 — AB (ref 0–0.9)
MPV: 6.9 fL (ref 0–99.8)
POC GRANULOCYTE: 0.6 — AB (ref 2–6.9)
POC LYMPH %: 4.6 % — AB (ref 10–50)
POC MID %: 1.2 %M (ref 0–12)
Platelet Count, POC: 356 10*3/uL (ref 142–424)
RBC: 5.62 M/uL — AB (ref 4.04–5.48)
RDW, POC: 13.5 %
WBC: 12.8 10*3/uL — AB (ref 4.6–10.2)

## 2017-02-13 LAB — COMPREHENSIVE METABOLIC PANEL
A/G RATIO: 0.9 — AB (ref 1.2–2.2)
A/G RATIO: 0.9 — AB (ref 1.2–2.2)
ALBUMIN: 2.6 g/dL — AB (ref 3.5–5.0)
ALK PHOS: 90 U/L (ref 38–126)
ALT: 23 IU/L (ref 0–32)
ALT: 26 IU/L (ref 0–32)
ALT: 25 U/L (ref 14–54)
AST: 23 IU/L (ref 0–40)
AST: 26 IU/L (ref 0–40)
AST: 25 U/L (ref 15–41)
Albumin: 3.1 g/dL — ABNORMAL LOW (ref 3.6–4.8)
Albumin: 3.1 g/dL — ABNORMAL LOW (ref 3.6–4.8)
Alkaline Phosphatase: 105 IU/L (ref 39–117)
Alkaline Phosphatase: 102 IU/L (ref 39–117)
Anion gap: 12 (ref 5–15)
BILIRUBIN TOTAL: 0.7 mg/dL (ref 0.3–1.2)
BUN/Creatinine Ratio: 18 (ref 12–28)
BUN/Creatinine Ratio: 20 (ref 12–28)
BUN: 12 mg/dL (ref 8–27)
BUN: 12 mg/dL (ref 8–27)
BUN: 15 mg/dL (ref 6–20)
Bilirubin Total: 0.4 mg/dL (ref 0.0–1.2)
Bilirubin Total: 0.4 mg/dL (ref 0.0–1.2)
CALCIUM: 8.9 mg/dL (ref 8.7–10.3)
CALCIUM: 8.9 mg/dL (ref 8.7–10.3)
CALCIUM: 8.4 mg/dL — AB (ref 8.9–10.3)
CO2: 20 mmol/L (ref 20–29)
CO2: 20 mmol/L (ref 20–29)
CO2: 24 mmol/L (ref 22–32)
CREATININE: 0.66 mg/dL (ref 0.57–1.00)
CREATININE: 0.6 mg/dL (ref 0.57–1.00)
CREATININE: 0.68 mg/dL (ref 0.44–1.00)
Chloride: 97 mmol/L (ref 96–106)
Chloride: 97 mmol/L (ref 96–106)
Chloride: 96 mmol/L — ABNORMAL LOW (ref 101–111)
GFR calc Af Amer: >60 mL/min (ref >60)
GFR calc non Af Amer: >60 mL/min (ref >60)
GFR, EST AFRICAN AMERICAN: 107 mL/min/{1.73_m2} (ref >59)
GFR, EST AFRICAN AMERICAN: 111 mL/min/{1.73_m2} (ref >59)
GFR, EST NON AFRICAN AMERICAN: 93 mL/min/{1.73_m2} (ref >59)
GFR, EST NON AFRICAN AMERICAN: 96 mL/min/{1.73_m2} (ref >59)
GLOBULIN, TOTAL: 3.4 g/dL (ref 1.5–4.5)
GLUCOSE: 170 mg/dL — AB (ref 65–99)
GLUCOSE: 166 mg/dL — AB (ref 65–99)
Globulin, Total: 3.3 g/dL (ref 1.5–4.5)
Glucose: 174 mg/dL — ABNORMAL HIGH (ref 65–99)
POTASSIUM: 4.3 mmol/L (ref 3.5–5.2)
Potassium: 4.1 mmol/L (ref 3.5–5.2)
Potassium: 3.8 mmol/L (ref 3.5–5.1)
SODIUM: 138 mmol/L (ref 134–144)
SODIUM: 132 mmol/L — AB (ref 135–145)
Sodium: 138 mmol/L (ref 134–144)
TOTAL PROTEIN: 6.5 g/dL (ref 6.0–8.5)
TOTAL PROTEIN: 6.4 g/dL (ref 6.0–8.5)
TOTAL PROTEIN: 6.7 g/dL (ref 6.5–8.1)

## 2017-02-13 LAB — URINALYSIS, ROUTINE W REFLEX MICROSCOPIC
BILIRUBIN URINE: NEGATIVE
Glucose, UA: NEGATIVE mg/dL
Ketones, ur: 80 mg/dL — AB
NITRITE: NEGATIVE
PH: 6 (ref 5.0–8.0)
Protein, ur: NEGATIVE mg/dL
Squamous Epithelial / LPF: NONE SEEN

## 2017-02-13 LAB — CBC WITH DIFFERENTIAL/PLATELET
Basophils Absolute: 0 10*3/uL (ref 0.0–0.2)
Basos: 0 %
EOS (ABSOLUTE): 0.1 10*3/uL (ref 0.0–0.4)
Eos: 1 %
Hematocrit: 46.1 % (ref 34.0–46.6)
Hemoglobin: 14.8 g/dL (ref 11.1–15.9)
IMMATURE GRANS (ABS): 0 10*3/uL (ref 0.0–0.1)
Immature Granulocytes: 0 %
LYMPHS: 9 %
Lymphocytes Absolute: 1.2 10*3/uL (ref 0.7–3.1)
MCH: 25.8 pg — AB (ref 26.6–33.0)
MCHC: 32.1 g/dL (ref 31.5–35.7)
MCV: 81 fL (ref 79–97)
MONOS ABS: 1.8 10*3/uL — AB (ref 0.1–0.9)
Monocytes: 13 %
NEUTROS ABS: 10.5 10*3/uL — AB (ref 1.4–7.0)
Neutrophils: 77 %
PLATELETS: 322 10*3/uL (ref 150–379)
RBC: 5.73 x10E6/uL — ABNORMAL HIGH (ref 3.77–5.28)
RDW: 13.8 % (ref 12.3–15.4)
WBC: 13.6 10*3/uL — ABNORMAL HIGH (ref 3.4–10.8)

## 2017-02-13 LAB — POCT URINALYSIS DIP (MANUAL ENTRY)
Glucose, UA: NEGATIVE mg/dL
Nitrite, UA: NEGATIVE
Spec Grav, UA: >=1.03 — AB (ref 1.010–1.025)
UROBILINOGEN UA: 1 U/dL
pH, UA: 6 (ref 5.0–8.0)

## 2017-02-13 LAB — POCT I-STAT CREATININE: Creatinine, Ser: 0.7 mg/dL (ref 0.44–1.00)

## 2017-02-13 LAB — LIPID PANEL
CHOL/HDL RATIO: 4.7 ratio — AB (ref 0.0–4.4)
Cholesterol, Total: 136 mg/dL (ref 100–199)
HDL: 29 mg/dL — ABNORMAL LOW (ref >39)
LDL CALC: 79 mg/dL (ref 0–99)
TRIGLYCERIDES: 141 mg/dL (ref 0–149)
VLDL Cholesterol Cal: 28 mg/dL (ref 5–40)

## 2017-02-13 LAB — INFLUENZA PANEL BY PCR (TYPE A & B)
Influenza A By PCR: NEGATIVE
Influenza B By PCR: NEGATIVE

## 2017-02-13 LAB — I-STAT CG4 LACTIC ACID, ED: Lactic Acid, Venous: 1.78 mmol/L (ref 0.5–1.9)

## 2017-02-13 LAB — LIPASE, BLOOD: Lipase: 14 U/L (ref 11–51)

## 2017-02-13 LAB — POCT GLYCOSYLATED HEMOGLOBIN (HGB A1C): HEMOGLOBIN A1C: 9.4

## 2017-02-13 MED ORDER — IOPAMIDOL (ISOVUE-370) INJECTION 76%
INTRAVENOUS | Status: AC
  Administered 2017-02-13: 100 mL via INTRAVENOUS
  Filled 2017-02-13: qty 100

## 2017-02-13 MED ORDER — VANCOMYCIN HCL IN DEXTROSE 1-5 GM/200ML-% IV SOLN
1000.0000 mg | Freq: Once | INTRAVENOUS | Status: AC
  Administered 2017-02-14: 1000 mg via INTRAVENOUS
  Filled 2017-02-13: qty 200

## 2017-02-13 MED ORDER — SODIUM CHLORIDE 0.9 % IV BOLUS (SEPSIS)
1500.0000 mL | Freq: Once | INTRAVENOUS | Status: AC
  Administered 2017-02-13: 1500 mL via INTRAVENOUS

## 2017-02-13 MED ORDER — FENTANYL CITRATE (PF) 100 MCG/2ML IJ SOLN
25.0000 ug | Freq: Once | INTRAMUSCULAR | Status: AC
  Administered 2017-02-13: 25 ug via INTRAVENOUS
  Filled 2017-02-13: qty 2

## 2017-02-13 MED ORDER — MORPHINE SULFATE (PF) 4 MG/ML IV SOLN
4.0000 mg | Freq: Once | INTRAVENOUS | Status: AC
  Administered 2017-02-13: 4 mg via INTRAVENOUS
  Filled 2017-02-13: qty 1

## 2017-02-13 MED ORDER — ACETAMINOPHEN 325 MG PO TABS
650.0000 mg | ORAL_TABLET | Freq: Once | ORAL | Status: AC
  Administered 2017-02-13: 650 mg via ORAL
  Filled 2017-02-13: qty 2

## 2017-02-13 MED ORDER — PIPERACILLIN-TAZOBACTAM 3.375 G IVPB 30 MIN
3.3750 g | Freq: Once | INTRAVENOUS | Status: AC
  Administered 2017-02-13: 3.375 g via INTRAVENOUS
  Filled 2017-02-13: qty 50

## 2017-02-13 MED ORDER — IOPAMIDOL (ISOVUE-300) INJECTION 61%
100.0000 mL | Freq: Once | INTRAVENOUS | Status: AC | PRN
  Administered 2017-02-13: 100 mL via INTRAVENOUS

## 2017-02-13 MED ORDER — IOPAMIDOL (ISOVUE-300) INJECTION 61%
30.0000 mL | Freq: Once | INTRAVENOUS | Status: AC | PRN
  Administered 2017-02-13: 30 mL via ORAL

## 2017-02-13 NOTE — Patient Instructions (Addendum)
Go to [facility] for CT abdomen and pelvis.   Address: [address], [place], [place] [address]  You will need to drink the contrast for two hours after you check in. I will call you with results Then return to clinic to see me on Saturday 02/15/2017 at 9am  Diverticulitis Diverticulitis is infection or inflammation of small pouches (diverticula) in the colon that form due to a condition called diverticulosis. Diverticula can trap stool (feces) and bacteria, causing infection and inflammation. Diverticulitis may cause severe stomach pain and diarrhea. It may lead to tissue damage in the colon that causes bleeding. The diverticula may also burst (rupture) and cause infected stool to enter other areas of the abdomen. Complications of diverticulitis can include:  Bleeding.  Severe infection.  Severe pain.  Rupture (perforation) of the colon.  Blockage (obstruction) of the colon.  What are the causes? This condition is caused by stool becoming trapped in the diverticula, which allows bacteria to grow in the diverticula. This leads to inflammation and infection. What increases the risk? You are more likely to develop this condition if:  You have diverticulosis. The risk for diverticulosis increases if: ? You are overweight or obese. ? You use tobacco products. ? You do not get enough exercise.  You eat a diet that does not include enough fiber. High-fiber foods include fruits, vegetables, beans, nuts, and whole grains.  What are the signs or symptoms? Symptoms of this condition may include:  Pain and tenderness in the abdomen. The pain is normally located on the left side of the abdomen, but it may occur in other areas.  Fever and chills.  Bloating.  Cramping.  Nausea.  Vomiting.  Changes in bowel routines.  Blood in your stool.  How is this diagnosed? This condition is diagnosed based on:  Your medical history.  A physical exam.  Tests to make sure there is  nothing else causing your condition. These tests may include: ? Blood tests. ? Urine tests. ? Imaging tests of the abdomen, including X-rays, ultrasounds, MRIs, or CT scans.  How is this treated? Most cases of this condition are mild and can be treated at home. Treatment may include:  Taking over-the-counter pain medicines.  Following a clear liquid diet.  Taking antibiotic medicines by mouth.  Rest.  More severe cases may need to be treated at a hospital. Treatment may include:  Not eating or drinking.  Taking prescription pain medicine.  Receiving antibiotic medicines through an IV tube.  Receiving fluids and nutrition through an IV tube.  Surgery.  When your condition is under control, your health care provider may recommend that you have a colonoscopy. This is an exam to look at the entire large intestine. During the exam, a lubricated, bendable tube is inserted into the anus and then passed into the rectum, colon, and other parts of the large intestine. A colonoscopy can show how severe your diverticula are and whether something else may be causing your symptoms. Follow these instructions at home: Medicines  Take over-the-counter and prescription medicines only as told by your health care provider. These include fiber supplements, probiotics, and stool softeners.  If you were prescribed an antibiotic medicine, take it as told by your health care provider. Do not stop taking the antibiotic even if you start to feel better.  Do not drive or use heavy machinery while taking prescription pain medicine. General instructions  Follow a full liquid diet or another diet as directed by your health care  provider. After your symptoms improve, your health care provider may tell you to change your diet. He or she may recommend that you eat a diet that contains at least 25 g (25 grams) of fiber daily. Fiber makes it easier to pass stool. Healthy sources of fiber include: ? Berries. One  cup contains 4-8 grams of fiber. ? Beans or lentils. One half cup contains 5-8 grams of fiber. ? Green vegetables. One cup contains 4 grams of fiber.  Exercise for at least 30 minutes, 3 times each week. You should exercise hard enough to raise your heart rate and break a sweat.  Keep all follow-up visits as told by your health care provider. This is important. You may need a colonoscopy. Contact a health care provider if:  Your pain does not improve.  You have a hard time drinking or eating food.  Your bowel movements do not return to normal. Get help right away if:  Your pain gets worse.  Your symptoms do not get better with treatment.  Your symptoms suddenly get worse.  You have a fever.  You vomit more than one time.  You have stools that are bloody, black, or tarry. Summary  Diverticulitis is infection or inflammation of small pouches (diverticula) in the colon that form due to a condition called diverticulosis. Diverticula can trap stool (feces) and bacteria, causing infection and inflammation.  You are at higher risk for this condition if you have diverticulosis and you eat a diet that does not include enough fiber.  Most cases of this condition are mild and can be treated at home. More severe cases may need to be treated at a hospital.  When your condition is under control, your health care provider may recommend that you have an exam called a colonoscopy. This exam can show how severe your diverticula are and whether something else may be causing your symptoms. This information is not intended to replace advice given to you by your health care provider. Make sure you discuss any questions you have with your health care provider. Document Released: 10/24/2004 Document Revised: 02/17/2016 Document Reviewed: 02/17/2016 Elsevier Interactive Patient Education  [employer].

## 2017-02-13 NOTE — Telephone Encounter (Signed)
Copied from [place] [id number]. Topic: Quick Communication - See Telephone Encounter >> Feb 13, 2017  2:55 PM Bea Graff, NT wrote: CRM for notification. See Telephone encounter for: Pt wanting to know when the antibiotic that was ordered today will be called into pharmacy?  01/28/2017.

## 2017-02-13 NOTE — ED Triage Notes (Addendum)
Per patient's daughter, patient c/o abdominal pain since November, had a CT scan today and sent to ED for further evaluation after concern for "cancer cells and elevated white blood cells."

## 2017-02-13 NOTE — Progress Notes (Signed)
A consult was received from an ED physician for sepsis per pharmacy dosing.  The patient's profile has been reviewed for ht/wt/allergies/indication/available labs.   A one time order has been placed for Vancomycin 1gm iv x1, and Zosyn 3.375gm iv x1.  Further antibiotics/pharmacy consults should be ordered by admitting physician if indicated.                       Thank you, Nani Skillern Crowford 01/28/2017  11:23 PM

## 2017-02-13 NOTE — ED Notes (Signed)
Pt is aware a urine sample is needed. 

## 2017-02-13 NOTE — Progress Notes (Signed)
Chief Complaint  Patient presents with  . left side pain    ongoing since November 2018 and is getting worse, diarrhea intermittent  and took pepto bismol for diarrhea and abd pain without relief  . Diabetes  . cold sxs with cough    x 3-4 days, taking tylenol severe cold for sxs    HPI   Patient was last seen 07/2016 Jessica Gregory reports that Jessica Gregory has been trying to improve Jessica Gregory a1c from 11.7%  Jessica Gregory ran out of invokana 2-3 months ago and is taking metformin BID Since November 2018 Jessica Gregory has been having diarrhea frequently  Jessica Gregory gets loose stools every time Jessica Gregory eats and has been having reduced appetite as well as abdominal bloating Jessica Gregory came in today because Jessica Gregory abdominal pain intensified Jessica Gregory reports that Jessica Gregory typically does not eat much food   Lab Results  Component Value Date   HGBA1C 9.4 02/16/2017    Lab Results  Component Value Date   HGBA1C 9.4 02/24/2017   Wt Readings from Last 3 Encounters:  02/05/2017 111 lb 12.8 oz (50.7 kg)  08/01/16 115 lb 9.6 oz (52.4 kg)  03/01/15 118 lb 9.6 oz (53.8 kg)       Past Medical History:  Diagnosis Date  . Language barrier 11/16/2014   From [place], does not yet speak English    Current Outpatient Medications  Medication Sig Dispense Refill  . canagliflozin (INVOKANA) 100 MG TABS tablet Take 1 tablet (100 mg total) by mouth daily before breakfast. 30 tablet 1  . clobetasol cream (TEMOVATE) 6.76 % Apply 1 application topically 2 (two) times daily. 30 g 1  . metFORMIN (GLUCOPHAGE) 500 MG tablet Take 1 tablet (500 mg total) by mouth 2 (two) times daily with a meal. 60 tablet 1   Current Facility-Administered Medications  Medication Dose Route Frequency Provider Last Rate Last Dose  . lidocaine-EPINEPHrine (XYLOCAINE-EPINEPHrine) 1 %-1:200000 (PF) injection 2 mL  2 mL Intradermal Once Delia Chimes A, MD        Allergies: No Known Allergies  History reviewed. No pertinent surgical history.  Social History   Socioeconomic History  .  Marital status: Married    Spouse name: None  . Number of children: None  . Years of education: None  . Highest education level: None  Social Needs  . Financial resource strain: None  . Food insecurity - worry: None  . Food insecurity - inability: None  . Transportation needs - medical: None  . Transportation needs - non-medical: None  Occupational History  . Occupation: work at [employer]  . Smoking status: Never Smoker  . Smokeless tobacco: Never Used  Substance and Sexual Activity  . Alcohol use: No  . Drug use: No  . Sexual activity: No  Other Topics Concern  . None  Social History Narrative   Single   Education: Other   Exercise: No    History reviewed. No pertinent family history.   Review of Systems  Constitutional: Positive for chills, fever, malaise/fatigue and weight loss.  Eyes: Negative for blurred vision, double vision and photophobia.  Respiratory: Negative for cough and shortness of breath.   Cardiovascular: Negative for chest pain, palpitations and leg swelling.  Gastrointestinal: Positive for diarrhea and nausea. Negative for abdominal pain, blood in stool, constipation and vomiting.  Genitourinary: Negative for dysuria, frequency and urgency.  Skin: Negative for itching and rash.  Neurological: Positive for dizziness. Negative for tingling, tremors, sensory change and headaches.  Objective: Vitals:   02/21/2017 1017  BP: 120/86  Pulse: (!) 132  Resp: 16  Temp: 97.6 F (36.4 C)  TempSrc: Oral  SpO2: 97%  Weight: 111 lb 12.8 oz (50.7 kg)  Height: 4' 10.5" (1.486 m)   BP Readings from Last 3 Encounters:  02/16/2017 120/86  08/01/16 (!) 147/86  03/01/15 120/80     Physical Exam  Constitutional: Jessica Gregory is oriented to person, place, and time. Jessica Gregory appears well-developed and well-nourished.  HENT:  Head: Normocephalic and atraumatic.  Right Ear: External ear normal.  Left Ear: External ear normal.  Mouth/Throat: Oropharynx is clear  and moist.  Eyes: Conjunctivae and EOM are normal.  Cardiovascular: Normal rate, regular rhythm and normal heart sounds.  No murmur heard. Pulmonary/Chest: Effort normal and breath sounds normal. No stridor. No respiratory distress.  Abdominal: Soft. Bowel sounds are normal. Jessica Gregory exhibits distension. Jessica Gregory exhibits no pulsatile liver, no fluid wave, no ascites, no pulsatile midline mass and no mass. There is no hepatosplenomegaly. There is tenderness in the suprapubic area, left upper quadrant and left lower quadrant. There is no rigidity, no rebound, no guarding, no CVA tenderness, no tenderness at McBurney's point and negative Murphy's sign. No hernia.  Musculoskeletal: Normal range of motion. Jessica Gregory exhibits no edema.  Neurological: Jessica Gregory is alert and oriented to person, place, and time.    Assessment and Plan Jessica Gregory was seen today for left side pain, diabetes and cold sxs with cough.  Diagnoses and all orders for this visit:  Uncontrolled type 2 diabetes mellitus with hyperglycemia ([place])- improved a1c but concerning as pt is not eating Will check liver function -     POCT glycosylated hemoglobin (Hb A1C) -     Cancel: Comprehensive metabolic panel -     Cancel: Basic metabolic panel; Future  Abdominal pain, acute, left lower quadrant- concerning for diverticular disease vs. Other etiology  Of the colon given pain, anorexia and leukocytosis -     POCT urinalysis dipstick -     POCT CBC -     Cancel: Comprehensive metabolic panel -     Cancel: DG Abd 1 View; Future -     CBC with Differential/Platelet -     Cancel: Basic metabolic panel; Future -     Comprehensive metabolic panel; Future  Dyslipidemia -     Lipid panel  Screening for breast cancer -     MM Digital Screening; Future  Need for influenza vaccination -     Flu Vaccine QUAD 36+ mos IM  Diarrhea due to malabsorption -     Lipase  Abdominal pain, chronic, epigastric -     Cancel: DG Abd 1 View; Future -     CBC with  Differential/Platelet -     Comprehensive metabolic panel; Future  Abdominal distension -     Cancel: DG Abd 1 View; Future -     CBC with Differential/Platelet -     Comprehensive metabolic panel; Future  Lower abdominal pain- concerning for diverticular disease or other cause  Referred stat due to patient's pain and leukocytosis -     CT Abdomen Pelvis W Contrast; Future  Other orders -     Cancel: POCT glycosylated hemoglobin (Hb A1C)     CLINICAL DATA:  Left abdomen pain since November. Assess for diverticulitis.  EXAM: CT ABDOMEN AND PELVIS WITH CONTRAST  TECHNIQUE: Multidetector CT imaging of the abdomen and pelvis was performed using the standard protocol following bolus administration of intravenous contrast.  CONTRAST:  176mL ISOVUE-300 IOPAMIDOL (ISOVUE-300) INJECTION 61%  COMPARISON:  Ultrasound December 30, 2003  FINDINGS: Lower chest: Innumerable small nodules identified throughout the lung bases. The heart size is normal.  Hepatobiliary: Several heterogeneous enhancing masses are identified within the liver, largest is in the right/left lobe liver measuring 7.4 x 4.6 cm. The largest mass is inseparable from the adjacent gallbladder/adjacent invasion is not excluded. The common bile duct is normal caliber at the head of pancreas.  Pancreas: Unremarkable. No pancreatic ductal dilatation or surrounding inflammatory changes.  Spleen: No acute abnormalities identified in the spleen. 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  Adrenals/Urinary Tract: The adrenal glands are normal. There is a small simple cysts in the lateral mid to lower pole right kidney. No focal left kidney lesion is noted. There is no hydronephrosis bilaterally. The bladder is decompressed limiting evaluation.  Stomach/Bowel: Multiple dilated small bowel loops are identified in the upper and mid abdomen. Colon is decompressed with mild diffuse bowel wall  thickening probably secondary to surrounding ascites. Contrast is noted in the colon.  Vascular/Lymphatic: Aortic atherosclerosis. No enlarged abdominal or pelvic lymph nodes.  Reproductive: The uterus is unremarkable. The ovaries are not definitely seen. There is an extensive amount of ascites throughout the abdomen and pelvis. Innumerable omental and peritoneal heterogeneously enhancing masses are identified throughout the abdomen and pelvis.  Other: Extensive ascites identified in the abdomen and pelvis.  Musculoskeletal: No acute abnormality identified.  IMPRESSION: Findings consistent with extensive lung, liver metastasis, peritoneal and omental metastasis with large amount of ascites in the abdomen and pelvis.  Multiple dilated small bowel loops in the upper and mid abdomen with contrast noted in the colon, probably due to ileus.   Electronically Signed   By: Abelardo Diesel M.D.   On: 02/22/2017 16:13  Disposition: Patient advised to go to ER right away for work up and concern about cancer as well as ileus as well as need for pain management  A total of 40 minutes were spent face-to-face with the patient during this encounter and over half of that time was spent on counseling and coordination of care.  [place]

## 2017-02-14 ENCOUNTER — Other Ambulatory Visit: Payer: Self-pay

## 2017-02-14 ENCOUNTER — Encounter (HOSPITAL_COMMUNITY): Payer: Self-pay

## 2017-02-14 ENCOUNTER — Inpatient Hospital Stay (HOSPITAL_COMMUNITY): Payer: 59

## 2017-02-14 DIAGNOSIS — E1365 Other specified diabetes mellitus with hyperglycemia: Secondary | ICD-10-CM | POA: Diagnosis not present

## 2017-02-14 DIAGNOSIS — E119 Type 2 diabetes mellitus without complications: Secondary | ICD-10-CM | POA: Diagnosis not present

## 2017-02-14 DIAGNOSIS — R110 Nausea: Secondary | ICD-10-CM | POA: Diagnosis not present

## 2017-02-14 DIAGNOSIS — Z5111 Encounter for antineoplastic chemotherapy: Secondary | ICD-10-CM | POA: Diagnosis not present

## 2017-02-14 DIAGNOSIS — F05 Delirium due to known physiological condition: Secondary | ICD-10-CM | POA: Diagnosis present

## 2017-02-14 DIAGNOSIS — I10 Essential (primary) hypertension: Secondary | ICD-10-CM | POA: Diagnosis present

## 2017-02-14 DIAGNOSIS — N390 Urinary tract infection, site not specified: Secondary | ICD-10-CM

## 2017-02-14 DIAGNOSIS — E871 Hypo-osmolality and hyponatremia: Secondary | ICD-10-CM | POA: Diagnosis present

## 2017-02-14 DIAGNOSIS — E872 Acidosis: Secondary | ICD-10-CM | POA: Diagnosis present

## 2017-02-14 DIAGNOSIS — R509 Fever, unspecified: Secondary | ICD-10-CM

## 2017-02-14 DIAGNOSIS — C787 Secondary malignant neoplasm of liver and intrahepatic bile duct: Secondary | ICD-10-CM | POA: Diagnosis present

## 2017-02-14 DIAGNOSIS — R1011 Right upper quadrant pain: Secondary | ICD-10-CM | POA: Diagnosis not present

## 2017-02-14 DIAGNOSIS — G9349 Other encephalopathy: Secondary | ICD-10-CM | POA: Diagnosis not present

## 2017-02-14 DIAGNOSIS — R739 Hyperglycemia, unspecified: Secondary | ICD-10-CM | POA: Diagnosis not present

## 2017-02-14 DIAGNOSIS — E46 Unspecified protein-calorie malnutrition: Secondary | ICD-10-CM | POA: Diagnosis not present

## 2017-02-14 DIAGNOSIS — E43 Unspecified severe protein-calorie malnutrition: Secondary | ICD-10-CM | POA: Diagnosis present

## 2017-02-14 DIAGNOSIS — J9601 Acute respiratory failure with hypoxia: Secondary | ICD-10-CM | POA: Diagnosis not present

## 2017-02-14 DIAGNOSIS — K5909 Other constipation: Secondary | ICD-10-CM | POA: Diagnosis not present

## 2017-02-14 DIAGNOSIS — C7801 Secondary malignant neoplasm of right lung: Secondary | ICD-10-CM | POA: Diagnosis present

## 2017-02-14 DIAGNOSIS — K5903 Drug induced constipation: Secondary | ICD-10-CM | POA: Diagnosis not present

## 2017-02-14 DIAGNOSIS — R180 Malignant ascites: Secondary | ICD-10-CM | POA: Diagnosis present

## 2017-02-14 DIAGNOSIS — A419 Sepsis, unspecified organism: Secondary | ICD-10-CM | POA: Diagnosis not present

## 2017-02-14 DIAGNOSIS — C482 Malignant neoplasm of peritoneum, unspecified: Secondary | ICD-10-CM | POA: Diagnosis not present

## 2017-02-14 DIAGNOSIS — E1165 Type 2 diabetes mellitus with hyperglycemia: Secondary | ICD-10-CM | POA: Diagnosis present

## 2017-02-14 DIAGNOSIS — Z66 Do not resuscitate: Secondary | ICD-10-CM | POA: Diagnosis present

## 2017-02-14 DIAGNOSIS — C799 Secondary malignant neoplasm of unspecified site: Secondary | ICD-10-CM | POA: Diagnosis present

## 2017-02-14 DIAGNOSIS — C7802 Secondary malignant neoplasm of left lung: Secondary | ICD-10-CM | POA: Diagnosis present

## 2017-02-14 DIAGNOSIS — I361 Nonrheumatic tricuspid (valve) insufficiency: Secondary | ICD-10-CM | POA: Diagnosis not present

## 2017-02-14 DIAGNOSIS — C569 Malignant neoplasm of unspecified ovary: Secondary | ICD-10-CM | POA: Diagnosis present

## 2017-02-14 DIAGNOSIS — R52 Pain, unspecified: Secondary | ICD-10-CM | POA: Diagnosis not present

## 2017-02-14 DIAGNOSIS — R64 Cachexia: Secondary | ICD-10-CM | POA: Diagnosis present

## 2017-02-14 DIAGNOSIS — D72829 Elevated white blood cell count, unspecified: Secondary | ICD-10-CM | POA: Diagnosis not present

## 2017-02-14 DIAGNOSIS — K560 Paralytic ileus: Secondary | ICD-10-CM | POA: Diagnosis not present

## 2017-02-14 DIAGNOSIS — R400 Somnolence: Secondary | ICD-10-CM | POA: Diagnosis not present

## 2017-02-14 DIAGNOSIS — R109 Unspecified abdominal pain: Secondary | ICD-10-CM | POA: Diagnosis not present

## 2017-02-14 DIAGNOSIS — E876 Hypokalemia: Secondary | ICD-10-CM | POA: Diagnosis present

## 2017-02-14 DIAGNOSIS — K56609 Unspecified intestinal obstruction, unspecified as to partial versus complete obstruction: Secondary | ICD-10-CM | POA: Diagnosis not present

## 2017-02-14 DIAGNOSIS — E785 Hyperlipidemia, unspecified: Secondary | ICD-10-CM | POA: Diagnosis present

## 2017-02-14 DIAGNOSIS — E878 Other disorders of electrolyte and fluid balance, not elsewhere classified: Secondary | ICD-10-CM | POA: Diagnosis not present

## 2017-02-14 DIAGNOSIS — J189 Pneumonia, unspecified organism: Secondary | ICD-10-CM | POA: Diagnosis present

## 2017-02-14 DIAGNOSIS — G893 Neoplasm related pain (acute) (chronic): Secondary | ICD-10-CM | POA: Diagnosis present

## 2017-02-14 DIAGNOSIS — R1310 Dysphagia, unspecified: Secondary | ICD-10-CM | POA: Diagnosis not present

## 2017-02-14 DIAGNOSIS — J9690 Respiratory failure, unspecified, unspecified whether with hypoxia or hypercapnia: Secondary | ICD-10-CM | POA: Diagnosis not present

## 2017-02-14 DIAGNOSIS — J9602 Acute respiratory failure with hypercapnia: Secondary | ICD-10-CM | POA: Diagnosis not present

## 2017-02-14 DIAGNOSIS — C7800 Secondary malignant neoplasm of unspecified lung: Secondary | ICD-10-CM | POA: Diagnosis not present

## 2017-02-14 DIAGNOSIS — L8990 Pressure ulcer of unspecified site, unspecified stage: Secondary | ICD-10-CM | POA: Diagnosis not present

## 2017-02-14 DIAGNOSIS — R0602 Shortness of breath: Secondary | ICD-10-CM | POA: Diagnosis not present

## 2017-02-14 DIAGNOSIS — J9621 Acute and chronic respiratory failure with hypoxia: Secondary | ICD-10-CM | POA: Diagnosis not present

## 2017-02-14 DIAGNOSIS — C786 Secondary malignant neoplasm of retroperitoneum and peritoneum: Secondary | ICD-10-CM | POA: Diagnosis present

## 2017-02-14 DIAGNOSIS — C801 Malignant (primary) neoplasm, unspecified: Secondary | ICD-10-CM | POA: Diagnosis not present

## 2017-02-14 DIAGNOSIS — J9811 Atelectasis: Secondary | ICD-10-CM | POA: Diagnosis present

## 2017-02-14 DIAGNOSIS — C762 Malignant neoplasm of abdomen: Secondary | ICD-10-CM | POA: Diagnosis not present

## 2017-02-14 DIAGNOSIS — J9622 Acute and chronic respiratory failure with hypercapnia: Secondary | ICD-10-CM | POA: Diagnosis not present

## 2017-02-14 DIAGNOSIS — K56600 Partial intestinal obstruction, unspecified as to cause: Secondary | ICD-10-CM | POA: Diagnosis not present

## 2017-02-14 LAB — GRAM STAIN

## 2017-02-14 LAB — COMPREHENSIVE METABOLIC PANEL
ALT: 19 U/L (ref 14–54)
AST: 23 U/L (ref 15–41)
Albumin: 2.3 g/dL — ABNORMAL LOW (ref 3.5–5.0)
Alkaline Phosphatase: 71 U/L (ref 38–126)
Anion gap: 10 (ref 5–15)
BILIRUBIN TOTAL: 1.3 mg/dL — AB (ref 0.3–1.2)
BUN: 13 mg/dL (ref 6–20)
CO2: 20 mmol/L — ABNORMAL LOW (ref 22–32)
CREATININE: 0.66 mg/dL (ref 0.44–1.00)
Calcium: 7.5 mg/dL — ABNORMAL LOW (ref 8.9–10.3)
Chloride: 102 mmol/L (ref 101–111)
GFR calc Af Amer: >60 mL/min (ref >60)
Glucose, Bld: 191 mg/dL — ABNORMAL HIGH (ref 65–99)
Potassium: 3.5 mmol/L (ref 3.5–5.1)
Sodium: 132 mmol/L — ABNORMAL LOW (ref 135–145)
Total Protein: 5.6 g/dL — ABNORMAL LOW (ref 6.5–8.1)

## 2017-02-14 LAB — BODY FLUID CELL COUNT WITH DIFFERENTIAL
LYMPHS FL: 14 %
MONOCYTE-MACROPHAGE-SEROUS FLUID: 82 % (ref 50–90)
NEUTROPHIL FLUID: 4 % (ref 0–25)
WBC FLUID: 614 uL (ref 0–1000)

## 2017-02-14 LAB — CBC
HCT: 39.4 % (ref 36.0–46.0)
Hemoglobin: 12.6 g/dL (ref 12.0–15.0)
MCH: 26.1 pg (ref 26.0–34.0)
MCHC: 32 g/dL (ref 30.0–36.0)
MCV: 81.7 fL (ref 78.0–100.0)
PLATELETS: 253 10*3/uL (ref 150–400)
RBC: 4.82 MIL/uL (ref 3.87–5.11)
RDW: 13.6 % (ref 11.5–15.5)
WBC: 16.9 10*3/uL — AB (ref 4.0–10.5)

## 2017-02-14 LAB — LIPASE: LIPASE: 8 U/L — AB (ref 14–72)

## 2017-02-14 LAB — GLUCOSE, CAPILLARY
Glucose-Capillary: 213 mg/dL — ABNORMAL HIGH (ref 65–99)
Glucose-Capillary: 232 mg/dL — ABNORMAL HIGH (ref 65–99)
Glucose-Capillary: 210 mg/dL — ABNORMAL HIGH (ref 65–99)
Glucose-Capillary: 151 mg/dL — ABNORMAL HIGH (ref 65–99)

## 2017-02-14 LAB — CBG MONITORING, ED: GLUCOSE-CAPILLARY: 186 mg/dL — AB (ref 65–99)

## 2017-02-14 LAB — MRSA PCR SCREENING: MRSA by PCR: NEGATIVE

## 2017-02-14 LAB — PROTIME-INR
INR: 1.08
Prothrombin Time: 13.9 seconds (ref 11.4–15.2)

## 2017-02-14 LAB — HIV ANTIBODY (ROUTINE TESTING W REFLEX): HIV Screen 4th Generation wRfx: NONREACTIVE

## 2017-02-14 LAB — APTT: aPTT: 33 seconds (ref 24–36)

## 2017-02-14 LAB — PROCALCITONIN: Procalcitonin: 1.05 ng/mL

## 2017-02-14 MED ORDER — DEXTROSE 5 % IV SOLN
1.0000 g | INTRAVENOUS | Status: DC
  Administered 2017-02-14 – 2017-02-17 (×4): 1 g via INTRAVENOUS
  Filled 2017-02-14 (×5): qty 10

## 2017-02-14 MED ORDER — GLUCERNA SHAKE PO LIQD
237.0000 mL | Freq: Three times a day (TID) | ORAL | Status: DC
  Administered 2017-02-15 – 2017-02-28 (×21): 237 mL via ORAL
  Filled 2017-02-14 (×48): qty 237

## 2017-02-14 MED ORDER — PIPERACILLIN-TAZOBACTAM 3.375 G IVPB
3.3750 g | Freq: Three times a day (TID) | INTRAVENOUS | Status: DC
  Administered 2017-02-14: 3.375 g via INTRAVENOUS
  Filled 2017-02-14: qty 50

## 2017-02-14 MED ORDER — SENNOSIDES-DOCUSATE SODIUM 8.6-50 MG PO TABS
1.0000 | ORAL_TABLET | Freq: Every evening | ORAL | Status: DC | PRN
  Administered 2017-02-19: 1 via ORAL
  Filled 2017-02-14: qty 1

## 2017-02-14 MED ORDER — BISACODYL 5 MG PO TBEC: 5.0000 mg | DELAYED_RELEASE_TABLET | Freq: Every day | ORAL | Status: DC | PRN

## 2017-02-14 MED ORDER — DEXTROSE-NACL 5-0.45 % IV SOLN
INTRAVENOUS | Status: DC
  Administered 2017-02-14 (×3): via INTRAVENOUS

## 2017-02-14 MED ORDER — LIDOCAINE HCL 1 % IJ SOLN
INTRAMUSCULAR | Status: AC
  Filled 2017-02-14: qty 10

## 2017-02-14 MED ORDER — BENZONATATE 100 MG PO CAPS
200.0000 mg | ORAL_CAPSULE | Freq: Three times a day (TID) | ORAL | Status: DC | PRN
  Administered 2017-02-14 – 2017-02-28 (×6): 200 mg via ORAL
  Filled 2017-02-14 (×8): qty 2

## 2017-02-14 MED ORDER — INSULIN ASPART 100 UNIT/ML ~~LOC~~ SOLN: 0.0000 [IU] | Freq: Every day | SUBCUTANEOUS | Status: DC

## 2017-02-14 MED ORDER — MORPHINE SULFATE (PF) 2 MG/ML IV SOLN: 1.0000 mg | INTRAVENOUS | Status: DC | PRN

## 2017-02-14 MED ORDER — IPRATROPIUM-ALBUTEROL 0.5-2.5 (3) MG/3ML IN SOLN
3.0000 mL | Freq: Four times a day (QID) | RESPIRATORY_TRACT | Status: DC
  Administered 2017-02-14 (×2): 3 mL via RESPIRATORY_TRACT
  Filled 2017-02-14 (×2): qty 3

## 2017-02-14 MED ORDER — HYDROCODONE-ACETAMINOPHEN 5-325 MG PO TABS
1.0000 | ORAL_TABLET | ORAL | Status: DC | PRN
  Administered 2017-02-14 (×2): 2 via ORAL
  Administered 2017-02-14: 1 via ORAL
  Administered 2017-02-15 – 2017-02-20 (×4): 2 via ORAL
  Filled 2017-02-14 (×2): qty 2
  Filled 2017-02-14: qty 1
  Filled 2017-02-14 (×4): qty 2

## 2017-02-14 MED ORDER — MORPHINE SULFATE (PF) 4 MG/ML IV SOLN
1.0000 mg | INTRAVENOUS | Status: DC | PRN
  Administered 2017-02-14 – 2017-02-18 (×11): 1 mg via INTRAVENOUS
  Filled 2017-02-14 (×11): qty 1

## 2017-02-14 MED ORDER — ACETAMINOPHEN 650 MG RE SUPP: 650.0000 mg | Freq: Four times a day (QID) | RECTAL | Status: DC | PRN

## 2017-02-14 MED ORDER — VANCOMYCIN HCL IN DEXTROSE 1-5 GM/200ML-% IV SOLN: 1000.0000 mg | INTRAVENOUS | Status: DC

## 2017-02-14 MED ORDER — ACETAMINOPHEN 325 MG PO TABS: 650.0000 mg | ORAL_TABLET | Freq: Four times a day (QID) | ORAL | Status: DC | PRN

## 2017-02-14 MED ORDER — INFLUENZA VAC SPLIT HIGH-DOSE 0.5 ML IM SUSY
0.5000 mL | PREFILLED_SYRINGE | INTRAMUSCULAR | Status: AC
  Administered 2017-02-16: 0.5 mL via INTRAMUSCULAR
  Filled 2017-02-14: qty 0.5

## 2017-02-14 MED ORDER — IBUPROFEN 800 MG PO TABS
800.0000 mg | ORAL_TABLET | Freq: Once | ORAL | Status: AC
  Administered 2017-02-14: 800 mg via ORAL
  Filled 2017-02-14: qty 1

## 2017-02-14 MED ORDER — INSULIN ASPART 100 UNIT/ML ~~LOC~~ SOLN
0.0000 [IU] | Freq: Three times a day (TID) | SUBCUTANEOUS | Status: DC
  Administered 2017-02-14: 3 [IU] via SUBCUTANEOUS
  Administered 2017-02-14: 2 [IU] via SUBCUTANEOUS
  Administered 2017-02-14 – 2017-02-15 (×2): 3 [IU] via SUBCUTANEOUS
  Administered 2017-02-15: 1 [IU] via SUBCUTANEOUS
  Administered 2017-02-15 – 2017-02-18 (×7): 2 [IU] via SUBCUTANEOUS

## 2017-02-14 MED ORDER — IPRATROPIUM-ALBUTEROL 0.5-2.5 (3) MG/3ML IN SOLN
3.0000 mL | Freq: Two times a day (BID) | RESPIRATORY_TRACT | Status: DC
  Administered 2017-02-15 – 2017-02-19 (×8): 3 mL via RESPIRATORY_TRACT
  Filled 2017-02-14 (×9): qty 3

## 2017-02-14 NOTE — ED Notes (Signed)
ED TO INPATIENT HANDOFF REPORT  Name/Age/Gender Jessica Gregory 67 y.o. female  Code Status    Code Status Orders  (From admission, onward)        Start     Ordered   02/14/17 0034  Full code  Continuous     02/14/17 0035    Code Status History    Date Active Date Inactive Code Status Order ID Comments User Context   This patient has a current code status but no historical code status.      Home/SNF/Other Home  Chief Complaint Abdominal pain  Level of Care/Admitting Diagnosis ED Disposition    ED Disposition Condition [facility] Area: [place] [[id number]]  Level of Care: Telemetry [5]  Admit to tele based on following criteria: Monitor QTC interval  Diagnosis: Sepsis [facility]) [[id number]]  Admitting Physician: Gerlean Ren [facility] [[id number]]  Attending Physician: Gerlean Ren [facility] [[id number]]  Estimated length of stay: past midnight tomorrow  Certification:: I certify this patient will need inpatient services for at least 2 midnights  PT Class (Do Not Modify): Inpatient [101]  PT Acc Code (Do Not Modify): Private [1]       Medical History Past Medical History:  Diagnosis Date  . Language barrier 11/16/2014   From [place], does not yet speak English    Allergies No Known Allergies  IV Location/Drains/Wounds Patient Lines/Drains/Airways Status   Active Line/Drains/Airways    Name:   Placement date:   Placement time:   Site:   Days:   Peripheral IV 02/17/2017 Anterior;Right Antecubital   02/24/2017    -    Antecubital   1   Peripheral IV 02/01/2017 Right Wrist   02/05/2017    2058    Wrist   1          Labs/Imaging Results for orders placed or performed during the hospital encounter of 02/04/2017 (from the past 48 hour(s))  Lipase, blood     Status: None   Collection Time: 01/29/2017  5:24 PM  Result Value Ref Range   Lipase 14 11 - 51 U/L  Comprehensive metabolic panel     Status: Abnormal   Collection Time: 01/28/2017  5:24 PM  Result  Value Ref Range   Sodium 132 (L) 135 - 145 mmol/L   Potassium 3.8 3.5 - 5.1 mmol/L   Chloride 96 (L) 101 - 111 mmol/L   CO2 24 22 - 32 mmol/L   Glucose, Bld 166 (H) 65 - 99 mg/dL   BUN 15 6 - 20 mg/dL   Creatinine, Ser 0.68 0.44 - 1.00 mg/dL   Calcium 8.4 (L) 8.9 - 10.3 mg/dL   Total Protein 6.7 6.5 - 8.1 g/dL   Albumin 2.6 (L) 3.5 - 5.0 g/dL   AST 25 15 - 41 U/L   ALT 25 14 - 54 U/L   Alkaline Phosphatase 90 38 - 126 U/L   Total Bilirubin 0.7 0.3 - 1.2 mg/dL   GFR calc non Af Amer >60 >60 mL/min   GFR calc Af Amer >60 >60 mL/min    Comment: (NOTE) The eGFR has been calculated using the CKD EPI equation. This calculation has not been validated in all clinical situations. eGFR's persistently <60 mL/min signify possible Chronic Kidney Disease.    Anion gap 12 5 - 15  CBC     Status: Abnormal   Collection Time: 02/24/2017  5:24 PM  Result Value Ref Range   WBC 15.4 (H) 4.0 - 10.5 K/uL  RBC 5.43 (H) 3.87 - 5.11 MIL/uL   Hemoglobin 14.4 12.0 - 15.0 g/dL   HCT 44.4 36.0 - 46.0 %   MCV 81.8 78.0 - 100.0 fL   MCH 26.5 26.0 - 34.0 pg   MCHC 32.4 30.0 - 36.0 g/dL   RDW 13.4 11.5 - 15.5 %   Platelets 323 150 - 400 K/uL  Urinalysis, Routine w reflex microscopic     Status: Abnormal   Collection Time: 02/22/2017  6:48 PM  Result Value Ref Range   Color, Urine YELLOW YELLOW   APPearance CLEAR CLEAR   Specific Gravity, Urine >1.046 (H) 1.005 - 1.030   pH 6.0 5.0 - 8.0   Glucose, UA NEGATIVE NEGATIVE mg/dL   Hgb urine dipstick SMALL (A) NEGATIVE   Bilirubin Urine NEGATIVE NEGATIVE   Ketones, ur 80 (A) NEGATIVE mg/dL   Protein, ur NEGATIVE NEGATIVE mg/dL   Nitrite NEGATIVE NEGATIVE   Leukocytes, UA MODERATE (A) NEGATIVE   RBC / HPF 6-30 0 - 5 RBC/hpf   WBC, UA 6-30 0 - 5 WBC/hpf   Bacteria, UA RARE (A) NONE SEEN   Squamous Epithelial / LPF NONE SEEN NONE SEEN  Influenza panel by PCR (type A & B)     Status: None   Collection Time: 01/30/2017  8:31 PM  Result Value Ref Range    Influenza A By PCR NEGATIVE NEGATIVE   Influenza B By PCR NEGATIVE NEGATIVE    Comment: (NOTE) The Xpert Xpress Flu assay is intended as an aid in the diagnosis of  influenza and should not be used as a sole basis for treatment.  This  assay is FDA approved for nasopharyngeal swab specimens only. Nasal  washings and aspirates are unacceptable for Xpert Xpress Flu testing.   I-Stat CG4 Lactic Acid, ED  (not at  [facility])     Status: None   Collection Time: 02/12/2017  9:10 PM  Result Value Ref Range   Lactic Acid, Venous 1.78 0.5 - 1.9 mmol/L   Dg Chest 2 View  Result Date: 02/06/2017 CLINICAL DATA:  67 y/o  F; fever and increasing shortness of breath. EXAM: CHEST  2 VIEW COMPARISON:  01/08/2013 chest radiograph FINDINGS: Stable normal cardiac silhouette given projection and technique. Diffuse patchy opacifications. No pleural effusion or pneumothorax. No acute osseous abnormality is evident. IMPRESSION: Diffuse patchy opacification of the lungs probably representing multifocal pneumonia. Electronically Signed   By: Kristine Garbe M.D.   On: 02/04/2017 22:26   Ct Angio Chest Pe W/cm &/or Wo Cm  Result Date: 02/10/2017 CLINICAL DATA:  Cancer cells and elevated white cells EXAM: CT ANGIOGRAPHY CHEST WITH CONTRAST TECHNIQUE: Multidetector CT imaging of the chest was performed using the standard protocol during bolus administration of intravenous contrast. Multiplanar CT image reconstructions and MIPs were obtained to evaluate the vascular anatomy. CONTRAST:  119m ISOVUE-370 IOPAMIDOL (ISOVUE-370) INJECTION 76% COMPARISON:  02/23/2017 radiograph, CT abdomen pelvis 02/08/2017 FINDINGS: Cardiovascular: Satisfactory opacification of the pulmonary arteries to the segmental level. Respiratory motion artifact limits assessment for emboli particularly in the lower lobes and right middle lobe. No central filling defects are visualized. Nonaneurysmal aorta. Mild aortic atherosclerosis. Normal heart size.  No pericardial effusion Mediastinum/Nodes: 9 mm lymph node adjacent to the left aortic arch. Midline trachea. No thyroid mass. Esophagus within normal limits Lungs/Pleura: Innumerable bilateral pulmonary nodules consistent with extensive metastatic disease. Mild bilateral septal thickening with nodularity, possible lymphangitic tumor spread. No pleural effusion or pneumothorax. Upper Abdomen: Ascites in the upper abdomen with  partially visualized hepatic mass lesion and multiple mesenteric/omental nodules in the left upper quadrant. Musculoskeletal: No acute or suspicious lesion Review of the MIP images confirms the above findings. IMPRESSION: 1. Respiratory motion artifact limits evaluation for emboli as discussed above. No definite central PE is seen 2. Extensive pulmonary nodules consistent with widespread metastatic disease. 3. Ascites in the upper abdomen with partially visible hepatic metastatic lesion and multiple metastatic foci in the left upper quadrant of the abdomen Aortic Atherosclerosis (ICD10-I70.0). Electronically Signed   By: Donavan Foil M.D.   On: 02/12/2017 23:49   Ct Abdomen Pelvis W Contrast  Result Date: 02/16/2017 CLINICAL DATA:  Left abdomen pain since November. Assess for diverticulitis. EXAM: CT ABDOMEN AND PELVIS WITH CONTRAST TECHNIQUE: Multidetector CT imaging of the abdomen and pelvis was performed using the standard protocol following bolus administration of intravenous contrast. CONTRAST:  174m ISOVUE-300 IOPAMIDOL (ISOVUE-300) INJECTION 61% COMPARISON:  Ultrasound December 30, 2003 FINDINGS: Lower chest: Innumerable small nodules identified throughout the lung bases. The heart size is normal. Hepatobiliary: Several heterogeneous enhancing masses are identified within the liver, largest is in the right/left lobe liver measuring 7.4 x 4.6 cm. The largest mass is inseparable from the adjacent gallbladder/adjacent invasion is not excluded. The common bile duct is normal caliber at  the head of pancreas. Pancreas: Unremarkable. No pancreatic ductal dilatation or surrounding inflammatory changes. Spleen: No acute abnormalities identified in the spleen. 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 Adrenals/Urinary Tract: The adrenal glands are normal. There is a small simple cysts in the lateral mid to lower pole right kidney. No focal left kidney lesion is noted. There is no hydronephrosis bilaterally. The bladder is decompressed limiting evaluation. Stomach/Bowel: Multiple dilated small bowel loops are identified in the upper and mid abdomen. Colon is decompressed with mild diffuse bowel wall thickening probably secondary to surrounding ascites. Contrast is noted in the colon. Vascular/Lymphatic: Aortic atherosclerosis. No enlarged abdominal or pelvic lymph nodes. Reproductive: The uterus is unremarkable. The ovaries are not definitely seen. There is an extensive amount of ascites throughout the abdomen and pelvis. Innumerable omental and peritoneal heterogeneously enhancing masses are identified throughout the abdomen and pelvis. Other: Extensive ascites identified in the abdomen and pelvis. Musculoskeletal: No acute abnormality identified. IMPRESSION: Findings consistent with extensive lung, liver metastasis, peritoneal and omental metastasis with large amount of ascites in the abdomen and pelvis. Multiple dilated small bowel loops in the upper and mid abdomen with contrast noted in the colon, probably due to ileus. Electronically Signed   By: WAbelardo DieselM.D.   On: 02/14/2017 16:13    Pending Labs Unresulted Labs (From admission, onward)   Start     Ordered   02/15/17 0500  Procalcitonin  Daily,   R     02/14/17 0037   02/14/17 0500  Comprehensive metabolic panel  Tomorrow morning,   R     02/14/17 0035   02/14/17 0500  CBC  Tomorrow morning,   R     02/14/17 0035   02/14/17 0500  Protime-INR  Tomorrow morning,   R     02/14/17 0035   02/14/17 0500  APTT   Tomorrow morning,   R     02/14/17 0035   02/14/17 0038  Procalcitonin - Baseline  STAT,   STAT     02/14/17 0037   02/14/17 0035  Culture, Urine  Once,   R     02/14/17 0035   02/14/17 0033  HIV antibody (Routine Testing)  Once,   R     02/14/17 0035   01/31/2017 2028  Blood Culture (routine x 2)  BLOOD CULTURE X 2,   STAT     02/27/2017 2027      Vitals/Pain Today's Vitals   02/18/2017 2300 02/14/17 0000 02/14/17 0030 02/14/17 0054  BP: 122/65 122/64 115/60   Pulse: (!) 131 (!) 118 (!) 118   Resp: (!) 26 (!) 31 (!) 23   Temp:      TempSrc:      SpO2: 92% 92% 93%   PainSc:    4     Isolation Precautions No active isolations  Medications Medications  vancomycin (VANCOCIN) IVPB 1000 mg/200 mL premix (1,000 mg Intravenous New Bag/Given 02/14/17 0053)  dextrose 5 %-0.45 % sodium chloride infusion (not administered)  acetaminophen (TYLENOL) tablet 650 mg (not administered)    Or  acetaminophen (TYLENOL) suppository 650 mg (not administered)  HYDROcodone-acetaminophen (NORCO/VICODIN) 5-325 MG per tablet 1-2 tablet (not administered)  senna-docusate (Senokot-S) tablet 1 tablet (not administered)  bisacodyl (DULCOLAX) EC tablet 5 mg (not administered)  insulin aspart (novoLOG) injection 0-9 Units (not administered)  insulin aspart (novoLOG) injection 0-5 Units (not administered)  morphine 2 MG/ML injection 1 mg (not administered)  ipratropium-albuterol (DUONEB) 0.5-2.5 (3) MG/3ML nebulizer solution 3 mL (3 mLs Nebulization Given 02/14/17 0113)  fentaNYL (SUBLIMAZE) injection 25 mcg (25 mcg Intravenous Given 02/08/2017 2120)  sodium chloride 0.9 % bolus 1,500 mL (0 mLs Intravenous Stopped 02/19/2017 2221)  morphine 4 MG/ML injection 4 mg (4 mg Intravenous Given 02/03/2017 2251)  acetaminophen (TYLENOL) tablet 650 mg (650 mg Oral Given 02/19/2017 2251)  iopamidol (ISOVUE-370) 76 % injection (100 mLs Intravenous Contrast Given 02/11/2017 2309)  piperacillin-tazobactam (ZOSYN) IVPB 3.375 g (0 g  Intravenous Stopped 02/14/17 0028)  ibuprofen (ADVIL,MOTRIN) tablet 800 mg (800 mg Oral Given 02/14/17 0026)    Mobility non-ambulatory

## 2017-02-14 NOTE — Addendum Note (Signed)
Addended by: Delia Chimes A on: 02/14/2017 04:28 PM   Modules accepted: Level of Service

## 2017-02-14 NOTE — ED Provider Notes (Signed)
[facility] DEPT Provider Note   CSN: [id number] Arrival date & time: 02/07/2017  1647     History   Chief Complaint Chief Complaint  Patient presents with  . Abdominal Pain    HPI Jessica Gregory is a 67 y.o. female.  HPI Patient presents to the emergency department with ongoing abdominal pain that started in November the patient was seen by her primary care doctor today and was sent to the emergency department following a CT scan.  The family states that the CT scan showed possible cancer.  The patient is also had cough and nasal drainage over the last couple of days.  The daughter states that the patient is able to eat and drink very well since the pain started in November.  The patient denies chest pain, shortness of breath, headache,blurred vision, neck pain, fever, cough, weakness, numbness, dizziness, anorexia, edema, , diarrhea, rash, back pain, dysuria, hematemesis, bloody stool, near syncope, or syncope. Past Medical History:  Diagnosis Date  . Language barrier 11/16/2014   From [place], does not yet speak English    Patient Active Problem List   Diagnosis Date Noted  . Diffuse papular rash 08/01/2016  . Psoriasis 08/01/2016  . Chronic hepatitis B ([place]) 08/01/2016  . Hepatitis, viral 03/01/2015  . Controlled type 2 diabetes mellitus without complication, without long-term current use of insulin ([place]) 03/01/2015  . Uncontrolled diabetes mellitus ([place]) 11/21/2014  . Language barrier 11/16/2014    History reviewed. No pertinent surgical history.  OB History    No data available       Home Medications    Prior to Admission medications   Medication Sig Start Date End Date Taking? Authorizing Provider  metFORMIN (GLUCOPHAGE) 500 MG tablet Take 1 tablet (500 mg total) by mouth 2 (two) times daily with a meal. 08/01/16  Yes Stallings, Zoe A, MD  Phenylephrine-DM-GG-APAP (TYLENOL COLD/FLU SEVERE PO) Take 2 tablets by mouth every 6 (six) hours  as needed (cold/flu symptoms).   Yes [provider]  canagliflozin (INVOKANA) 100 MG TABS tablet Take 1 tablet (100 mg total) by mouth daily before breakfast. Patient not taking: Reported on 01/28/2017 08/01/16   Forrest Moron, MD  clobetasol cream (TEMOVATE) 5.00 % Apply 1 application topically 2 (two) times daily. Patient not taking: Reported on 02/04/2017 08/01/16   Forrest Moron, MD    Family History No family history on file.  Social History Social History   Tobacco Use  . Smoking status: Never Smoker  . Smokeless tobacco: Never Used  Substance Use Topics  . Alcohol use: No  . Drug use: No     Allergies   Patient has no known allergies.   Review of Systems Review of Systems All other systems negative except as documented in the HPI. All pertinent positives and negatives as reviewed in the HPI.  Physical Exam Updated Vital Signs BP (!) 154/74   Pulse (!) 134   Temp 97.9 F (36.6 C) (Oral)   Resp (!) 33   SpO2 98%   Physical Exam  Constitutional: She is oriented to person, place, and time. She appears well-developed and well-nourished. No distress.  HENT:  Head: Normocephalic and atraumatic.  Mouth/Throat: Oropharynx is clear and moist.  Eyes: Pupils are equal, round, and reactive to light.  Neck: Normal range of motion. Neck supple.  Cardiovascular: Normal rate, regular rhythm and normal heart sounds. Exam reveals no gallop and no friction rub.  No murmur heard. Pulmonary/Chest: Effort normal and  breath sounds normal. No respiratory distress. She has no wheezes.  Abdominal: Soft. Bowel sounds are normal. She exhibits distension and ascites. There is generalized tenderness. There is no rigidity and no guarding. No hernia.  Neurological: She is alert and oriented to person, place, and time. She exhibits normal muscle tone. Coordination normal.  Skin: Skin is warm and dry. Capillary refill takes less than 2 seconds. No rash noted. No erythema.    Psychiatric: She has a normal mood and affect. Her behavior is normal.  Nursing note and vitals reviewed.    ED Treatments / Results  Labs (all labs ordered are listed, but only abnormal results are displayed) Labs Reviewed  COMPREHENSIVE METABOLIC PANEL - Abnormal; Notable for the following components:      Result Value   Sodium 132 (*)    Chloride 96 (*)    Glucose, Bld 166 (*)    Calcium 8.4 (*)    Albumin 2.6 (*)    All other components within normal limits  CBC - Abnormal; Notable for the following components:   WBC 15.4 (*)    RBC 5.43 (*)    All other components within normal limits  URINALYSIS, ROUTINE W REFLEX MICROSCOPIC - Abnormal; Notable for the following components:   Specific Gravity, Urine >1.046 (*)    Hgb urine dipstick SMALL (*)    Ketones, ur 80 (*)    Leukocytes, UA MODERATE (*)    Bacteria, UA RARE (*)    All other components within normal limits  CULTURE, BLOOD (ROUTINE X 2)  CULTURE, BLOOD (ROUTINE X 2)  LIPASE, BLOOD  INFLUENZA PANEL BY PCR (TYPE A & B)  I-STAT CG4 LACTIC ACID, ED  I-STAT CG4 LACTIC ACID, ED    EKG  EKG Interpretation None       Radiology Dg Chest 2 View  Result Date: 02/12/2017 CLINICAL DATA:  67 y/o  F; fever and increasing shortness of breath. EXAM: CHEST  2 VIEW COMPARISON:  01/08/2013 chest radiograph FINDINGS: Stable normal cardiac silhouette given projection and technique. Diffuse patchy opacifications. No pleural effusion or pneumothorax. No acute osseous abnormality is evident. IMPRESSION: Diffuse patchy opacification of the lungs probably representing multifocal pneumonia. Electronically Signed   By: Kristine Garbe M.D.   On: 02/04/2017 22:26   Ct Angio Chest Pe W/cm &/or Wo Cm  Result Date: 01/30/2017 CLINICAL DATA:  Cancer cells and elevated white cells EXAM: CT ANGIOGRAPHY CHEST WITH CONTRAST TECHNIQUE: Multidetector CT imaging of the chest was performed using the standard protocol during bolus  administration of intravenous contrast. Multiplanar CT image reconstructions and MIPs were obtained to evaluate the vascular anatomy. CONTRAST:  135mL ISOVUE-370 IOPAMIDOL (ISOVUE-370) INJECTION 76% COMPARISON:  02/23/2017 radiograph, CT abdomen pelvis 02/21/2017 FINDINGS: Cardiovascular: Satisfactory opacification of the pulmonary arteries to the segmental level. Respiratory motion artifact limits assessment for emboli particularly in the lower lobes and right middle lobe. No central filling defects are visualized. Nonaneurysmal aorta. Mild aortic atherosclerosis. Normal heart size. No pericardial effusion Mediastinum/Nodes: 9 mm lymph node adjacent to the left aortic arch. Midline trachea. No thyroid mass. Esophagus within normal limits Lungs/Pleura: Innumerable bilateral pulmonary nodules consistent with extensive metastatic disease. Mild bilateral septal thickening with nodularity, possible lymphangitic tumor spread. No pleural effusion or pneumothorax. Upper Abdomen: Ascites in the upper abdomen with partially visualized hepatic mass lesion and multiple mesenteric/omental nodules in the left upper quadrant. Musculoskeletal: No acute or suspicious lesion Review of the MIP images confirms the above findings. IMPRESSION: 1. Respiratory motion artifact  limits evaluation for emboli as discussed above. No definite central PE is seen 2. Extensive pulmonary nodules consistent with widespread metastatic disease. 3. Ascites in the upper abdomen with partially visible hepatic metastatic lesion and multiple metastatic foci in the left upper quadrant of the abdomen Aortic Atherosclerosis (ICD10-I70.0). Electronically Signed   By: Donavan Foil M.D.   On: 02/21/2017 23:49   Ct Abdomen Pelvis W Contrast  Result Date: 02/15/2017 CLINICAL DATA:  Left abdomen pain since November. Assess for diverticulitis. EXAM: CT ABDOMEN AND PELVIS WITH CONTRAST TECHNIQUE: Multidetector CT imaging of the abdomen and pelvis was performed  using the standard protocol following bolus administration of intravenous contrast. CONTRAST:  132mL ISOVUE-300 IOPAMIDOL (ISOVUE-300) INJECTION 61% COMPARISON:  Ultrasound December 30, 2003 FINDINGS: Lower chest: Innumerable small nodules identified throughout the lung bases. The heart size is normal. Hepatobiliary: Several heterogeneous enhancing masses are identified within the liver, largest is in the right/left lobe liver measuring 7.4 x 4.6 cm. The largest mass is inseparable from the adjacent gallbladder/adjacent invasion is not excluded. The common bile duct is normal caliber at the head of pancreas. Pancreas: Unremarkable. No pancreatic ductal dilatation or surrounding inflammatory changes. Spleen: No acute abnormalities identified in the spleen. 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 Adrenals/Urinary Tract: The adrenal glands are normal. There is a small simple cysts in the lateral mid to lower pole right kidney. No focal left kidney lesion is noted. There is no hydronephrosis bilaterally. The bladder is decompressed limiting evaluation. Stomach/Bowel: Multiple dilated small bowel loops are identified in the upper and mid abdomen. Colon is decompressed with mild diffuse bowel wall thickening probably secondary to surrounding ascites. Contrast is noted in the colon. Vascular/Lymphatic: Aortic atherosclerosis. No enlarged abdominal or pelvic lymph nodes. Reproductive: The uterus is unremarkable. The ovaries are not definitely seen. There is an extensive amount of ascites throughout the abdomen and pelvis. Innumerable omental and peritoneal heterogeneously enhancing masses are identified throughout the abdomen and pelvis. Other: Extensive ascites identified in the abdomen and pelvis. Musculoskeletal: No acute abnormality identified. IMPRESSION: Findings consistent with extensive lung, liver metastasis, peritoneal and omental metastasis with large amount of ascites in the abdomen and  pelvis. Multiple dilated small bowel loops in the upper and mid abdomen with contrast noted in the colon, probably due to ileus. Electronically Signed   By: Abelardo Diesel M.D.   On: 01/28/2017 16:13    Procedures Procedures (including critical care time)  Medications Ordered in ED Medications  piperacillin-tazobactam (ZOSYN) IVPB 3.375 g (3.375 g Intravenous New Bag/Given 01/30/2017 2350)  vancomycin (VANCOCIN) IVPB 1000 mg/200 mL premix (not administered)  fentaNYL (SUBLIMAZE) injection 25 mcg (25 mcg Intravenous Given 01/29/2017 2120)  sodium chloride 0.9 % bolus 1,500 mL (0 mLs Intravenous Stopped 02/15/2017 2221)  morphine 4 MG/ML injection 4 mg (4 mg Intravenous Given 02/22/2017 2251)  acetaminophen (TYLENOL) tablet 650 mg (650 mg Oral Given 02/07/2017 2251)  iopamidol (ISOVUE-370) 76 % injection (100 mLs Intravenous Contrast Given 02/27/2017 2309)     Initial Impression / Assessment and Plan / ED Course  I have reviewed the triage vital signs and the nursing notes.  Pertinent labs & imaging results that were available during my care of the patient were reviewed by me and considered in my medical decision making (see chart for details).     I spoke with the [facility] for admission for the patient.  Patient was given IV antibiotics due to the fact that they could represent septic source along with  all of her metastatic disease.  Patient is advised of the plan and all questions were answered.  Final Clinical Impressions(s) / ED Diagnoses   Final diagnoses:  None    ED Discharge Orders    None       Dalia Heading, PA-C 02/14/17 0014    Macarthur Critchley, MD 02/14/17 2311

## 2017-02-14 NOTE — Progress Notes (Signed)
Pharmacy Antibiotic Note  Malayshia All is a 67 y.o. female admitted on 02/05/2017 with pneumonia and sepsis.  Pharmacy has been consulted for Vancomycin and Zosyn dosing.  Plan: Zosyn 3.375g IV q8h (4 hour infusion).  Vancomycin 1gm iv q36hr Goal AUC = 400 - 500 for all indications, except meningitis (goal AUC > 500 and Cmin 15-20 mcg/mL)   Weight: 121 lb 11.1 oz (55.2 kg)  Temp (24hrs), Avg:97.6 F (36.4 C), Min:97.4 F (36.3 C), Max:97.9 F (36.6 C)  Recent Labs  Lab 01/28/2017 1048 02/20/2017 1105 02/03/2017 1117 02/24/2017 1427 01/30/2017 1524 02/22/2017 1724 02/10/2017 2110 02/14/17 0223  WBC  --  12.8* 13.6*  --   --  15.4*  --   --   CREATININE 0.66  --   --  0.60 0.70 0.68  --  0.66  LATICACIDVEN  --   --   --   --   --   --  1.78  --     Estimated Creatinine Clearance: 52.4 mL/min (by C-G formula based on SCr of 0.66 mg/dL).    No Known Allergies  Antimicrobials this admission: Vancomycin 02/14/2017 >> Zosyn 02/14/2017 >>   Dose adjustments this admission: -  Microbiology results: pending  Thank you for allowing pharmacy to be a part of this patient's care.  Nani Skillern Crowford 02/14/2017 3:04 AM

## 2017-02-14 NOTE — ED Notes (Signed)
Pt's daughters phone number is [phone]. Daughter is ok with being called if any pt info is needed or translation is needed or any questions need to be answered.

## 2017-02-14 NOTE — Procedures (Signed)
PROCEDURE SUMMARY:  Successful US guided paracentesis from RLQ.  Yielded 2.5 L of slightly hazy yellow fluid.  No immediate complications.  Pt tolerated well.   Specimen was sent for labs.  Will await cytology results before moving forward with any tissue biopsy. Discussed with pt and daughter that biopsy of liver or peritoneal lesion may be necessary.   Ascencion Dike PA-C 02/14/2017 11:17 AM

## 2017-02-14 NOTE — Progress Notes (Signed)
Inpatient Diabetes Program Recommendations  AACE/ADA: New Consensus Statement on Inpatient Glycemic Control (2015)  Target Ranges:  Prepandial:   less than 140 mg/dL      Peak postprandial:   less than 180 mg/dL (1-2 hours)      Critically ill patients:  140 - 180 mg/dL   Results for Jessica Gregory, Jessica Gregory (MRN [id number]) as of 02/14/2017 11:42  Ref. 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 Start low dose Basal insulin: Lantus 5 units daily (0.1 units/kg dosing)  2. Please also increase frequency of Novolog SSI to Q4 hours while patient NPO     --Will follow patient during hospitalization--  Wyn Quaker RN, MSN, CDE Diabetes Coordinator Inpatient Glycemic Control Team Team Pager: [phone] (8a-5p)

## 2017-02-14 NOTE — Progress Notes (Signed)
PROGRESS NOTE        PATIENT DETAILS Name: Jessica Gregory Age: 67 y.o. Sex: female Date of Birth: [birth year] Admit Date: 02/18/2017 Admitting Physician Ankit Arsenio Loader, MD [id number], Arlie Solomons, MD  Brief Narrative: Patient is a 67 y.o. female with history of DM-2 who was referred to the emergency room by her primary care practitioner for evaluation of an abnormal CT scan.  Apparently, since this past November-patient has had worsening abdominal pain and distention, a CT scan of the abdomen done in the outpatient setting showed extensive lung, liver, peritoneal and omental metastases with large amount of abscess.  Primary tumor is currently not evident.  Also-for the past few days, patient has been having subjective fever, cough and other URI symptoms.  See below for further details.  Subjective: Continues to complain of abdominal pain and distention.  Afebrile overnight.  Daughter at bedside and is translating.  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  Have discussed case with IR MD-Dr. Reece Levy will formally evaluate as well.  Once biopsy results obtained, we can consult oncology.  In the meantime she continues to have significant wound of abdominal pain and discomfort-we will continue to provide supportive care.  UTI: Doubt sepsis pathophysiology-she appears stable-we will discontinue vancomycin and Zosyn-narrow antimicrobial regimen to just Rocephin.  Does have leukocytosis-but I suspect that could be from underlying malignancy/peritoneal/omental inflammation.  Urine cultures not sent in the emergency room-patient has already received numerous doses of antibiotics-likely urine culture will now be sterile.  Will await blood cultures.  DM-2: CBGs relatively stable with SSI-continue to  hold all oral hypoglycemic agents.  DVT Prophylaxis: SCD's  Code Status: Full code   Family Communication:  at bedside  Disposition Plan: Remain inpatient  Antimicrobial agents: Anti-infectives (From admission, onward)   Start     Dose/Rate Route Frequency Ordered Stop   02/15/17 1200  vancomycin (VANCOCIN) IVPB 1000 mg/200 mL premix     1,000 mg 200 mL/hr over 60 Minutes Intravenous Every 36 hours 02/14/17 0303     02/14/17 0600  piperacillin-tazobactam (ZOSYN) IVPB 3.375 g     3.375 g 12.5 mL/hr over 240 Minutes Intravenous Every 8 hours 02/14/17 0304     02/03/2017 2330  piperacillin-tazobactam (ZOSYN) IVPB 3.375 g     3.375 g 100 mL/hr over 30 Minutes Intravenous  Once 02/15/2017 2317 02/14/17 0028   02/04/2017 2330  vancomycin (VANCOCIN) IVPB 1000 mg/200 mL premix     1,000 mg 200 mL/hr over 60 Minutes Intravenous  Once 02/12/2017 2317 02/14/17 0159      Procedures: None  CONSULTS:  IR  Time spent: 25- minutes-Greater than 50% of this time was spent in counseling, explanation of diagnosis, planning of further management, and coordination of care.  MEDICATIONS: Scheduled Meds: . lidocaine      . [START ON 02/15/2017] Influenza vac split quadrivalent PF  0.5 mL Intramuscular Tomorrow-1000  . insulin aspart  0-5 Units Subcutaneous QHS  . insulin aspart  0-9 Units Subcutaneous TID WC  . ipratropium-albuterol  3 mL Nebulization Q6H   Continuous Infusions: . dextrose 5 % and 0.45% NaCl 100 mL/hr at 02/14/17 0245  . piperacillin-tazobactam (ZOSYN)  IV 3.375 g (02/14/17 0641)  . [START  ON 02/15/2017] vancomycin     PRN Meds:.acetaminophen **OR** acetaminophen, bisacodyl, HYDROcodone-acetaminophen, morphine injection, senna-docusate   PHYSICAL EXAM: Vital signs: Vitals:   02/14/17 0642 02/14/17 0813 02/14/17 0944 02/14/17 0954  BP: 136/68  (!) 146/93 (!) 151/94  Pulse: (!) 109     Resp: (!) 22     Temp: 97.6 F (36.4 C)     TempSrc: Oral     SpO2: 93% 95%      Weight:      Height:       Filed Weights   02/14/17 0222  Weight: 55.2 kg (121 lb 11.1 oz)   Body mass index is 25 kg/m.   General appearance :Awake, alert, not in any distress.  Eyes:, pupils equally reactive to light and accomodation HEENT: Atraumatic and Normocephalic Neck: supple, no JVD. No cervical lymphadenopathy.  Resp:Good air entry bilaterally, no added sounds  CVS: S1 S2 regular, no murmurs.  GI: Bowel sounds present,distended-but soft-appears to be diffusely tender without any peritoneal signs.  Extremities: B/L Lower Ext shows no edema, both legs are warm to touch Neurology:  speech clear,Non focal, sensation is grossly intact. Psychiatric: Normal judgment and insight. Alert and oriented x 3. Normal mood. Musculoskeletal:No digital cyanosis Skin:No Rash, warm and dry Wounds:N/A  I have personally reviewed following labs and imaging studies  LABORATORY DATA: CBC: Recent Labs  Lab 02/12/2017 1105 02/15/2017 1117 02/08/2017 1724 02/14/17 0223  WBC 12.8* 13.6* 15.4* 16.9*  NEUTROABS  --  10.5*  --   --   HGB 14.8 14.8 14.4 12.6  HCT 45.5 46.1 44.4 39.4  MCV 81.0 81 81.8 81.7  PLT  --  322 323 272    Basic Metabolic Panel: Recent Labs  Lab 02/15/2017 1048 02/20/2017 1427 01/31/2017 1524 01/28/2017 1724 02/14/17 0223  NA 138 138  --  132* 132*  K 4.3 4.1  --  3.8 3.5  CL 97 97  --  96* 102  CO2 20 20  --  24 20*  GLUCOSE 174* 170*  --  166* 191*  BUN 12 12  --  15 13  CREATININE 0.66 0.60 0.70 0.68 0.66  CALCIUM 8.9 8.9  --  8.4* 7.5*    GFR: Estimated Creatinine Clearance: 52.4 mL/min (by C-G formula based on SCr of 0.66 mg/dL).  Liver Function Tests: Recent Labs  Lab 02/10/2017 1048 02/25/2017 1427 02/07/2017 1724 02/14/17 0223  AST 23 26 25 23   ALT 23 26 25 19   ALKPHOS 105 102 90 71  BILITOT 0.4 0.4 0.7 1.3*  PROT 6.5 6.4 6.7 5.6*  ALBUMIN 3.1* 3.1* 2.6* 2.3*   Recent Labs  Lab 02/25/2017 1117 02/17/2017 1724  LIPASE 8* 14   No results for  input(s): AMMONIA in the last 168 hours.  Coagulation Profile: Recent Labs  Lab 02/14/17 0223  INR 1.08    Cardiac Enzymes: No results for input(s): CKTOTAL, CKMB, CKMBINDEX, TROPONINI in the last 168 hours.  BNP (last 3 results) No results for input(s): PROBNP in the last 8760 hours.  HbA1C: Recent Labs    02/20/2017 1103  HGBA1C 9.4    CBG: Recent Labs  Lab 02/14/17 0133 02/14/17 0644 02/14/17 0835  GLUCAP 186* 213* 232*    Lipid Profile: Recent Labs    01/28/2017 1048  CHOL 136  HDL 29*  LDLCALC 79  TRIG 141  CHOLHDL 4.7*    Thyroid Function Tests: No results for input(s): TSH, T4TOTAL, FREET4, T3FREE, THYROIDAB in the last 72 hours.  Anemia Panel:  No results for input(s): VITAMINB12, FOLATE, FERRITIN, TIBC, IRON, RETICCTPCT in the last 72 hours.  Urine analysis:    Component Value Date/Time   COLORURINE YELLOW 02/07/2017 1848   APPEARANCEUR CLEAR 02/14/2017 1848   LABSPEC >1.046 (H) 02/18/2017 1848   PHURINE 6.0 02/16/2017 1848   GLUCOSEU NEGATIVE 02/23/2017 1848   HGBUR SMALL (A) 02/07/2017 1848   BILIRUBINUR NEGATIVE 02/06/2017 1848   BILIRUBINUR moderate (A) 01/29/2017 1127   KETONESUR 80 (A) 02/23/2017 1848   PROTEINUR NEGATIVE 01/30/2017 1848   UROBILINOGEN 1.0 02/14/2017 1127   NITRITE NEGATIVE 02/05/2017 1848   LEUKOCYTESUR MODERATE (A) 02/12/2017 1848    Sepsis Labs: Lactic Acid, Venous    Component Value Date/Time   LATICACIDVEN 1.78 02/18/2017 2110    MICROBIOLOGY: No results found for this or any previous visit (from the past 240 hour(s)).  RADIOLOGY STUDIES/RESULTS: Dg Chest 2 View  Result Date: 02/14/2017 CLINICAL DATA:  67 y/o  F; fever and increasing shortness of breath. EXAM: CHEST  2 VIEW COMPARISON:  01/08/2013 chest radiograph FINDINGS: Stable normal cardiac silhouette given projection and technique. Diffuse patchy opacifications. No pleural effusion or pneumothorax. No acute osseous abnormality is evident. IMPRESSION:  Diffuse patchy opacification of the lungs probably representing multifocal pneumonia. Electronically Signed   By: Kristine Garbe M.D.   On: 01/31/2017 22:26   Ct Angio Chest Pe W/cm &/or Wo Cm  Result Date: 02/23/2017 CLINICAL DATA:  Cancer cells and elevated white cells EXAM: CT ANGIOGRAPHY CHEST WITH CONTRAST TECHNIQUE: Multidetector CT imaging of the chest was performed using the standard protocol during bolus administration of intravenous contrast. Multiplanar CT image reconstructions and MIPs were obtained to evaluate the vascular anatomy. CONTRAST:  157mL ISOVUE-370 IOPAMIDOL (ISOVUE-370) INJECTION 76% COMPARISON:  02/08/2017 radiograph, CT abdomen pelvis 01/31/2017 FINDINGS: Cardiovascular: Satisfactory opacification of the pulmonary arteries to the segmental level. Respiratory motion artifact limits assessment for emboli particularly in the lower lobes and right middle lobe. No central filling defects are visualized. Nonaneurysmal aorta. Mild aortic atherosclerosis. Normal heart size. No pericardial effusion Mediastinum/Nodes: 9 mm lymph node adjacent to the left aortic arch. Midline trachea. No thyroid mass. Esophagus within normal limits Lungs/Pleura: Innumerable bilateral pulmonary nodules consistent with extensive metastatic disease. Mild bilateral septal thickening with nodularity, possible lymphangitic tumor spread. No pleural effusion or pneumothorax. Upper Abdomen: Ascites in the upper abdomen with partially visualized hepatic mass lesion and multiple mesenteric/omental nodules in the left upper quadrant. Musculoskeletal: No acute or suspicious lesion Review of the MIP images confirms the above findings. IMPRESSION: 1. Respiratory motion artifact limits evaluation for emboli as discussed above. No definite central PE is seen 2. Extensive pulmonary nodules consistent with widespread metastatic disease. 3. Ascites in the upper abdomen with partially visible hepatic metastatic lesion and  multiple metastatic foci in the left upper quadrant of the abdomen Aortic Atherosclerosis (ICD10-I70.0). Electronically Signed   By: Donavan Foil M.D.   On: 02/01/2017 23:49   Ct Abdomen Pelvis W Contrast  Result Date: 02/04/2017 CLINICAL DATA:  Left abdomen pain since November. Assess for diverticulitis. EXAM: CT ABDOMEN AND PELVIS WITH CONTRAST TECHNIQUE: Multidetector CT imaging of the abdomen and pelvis was performed using the standard protocol following bolus administration of intravenous contrast. CONTRAST:  131mL ISOVUE-300 IOPAMIDOL (ISOVUE-300) INJECTION 61% COMPARISON:  Ultrasound December 30, 2003 FINDINGS: Lower chest: Innumerable small nodules identified throughout the lung bases. The heart size is normal. Hepatobiliary: Several heterogeneous enhancing masses are identified within the liver, largest is in the right/left lobe liver measuring 7.4  x 4.6 cm. The largest mass is inseparable from the adjacent gallbladder/adjacent invasion is not excluded. The common bile duct is normal caliber at the head of pancreas. Pancreas: Unremarkable. No pancreatic ductal dilatation or surrounding inflammatory changes. Spleen: No acute abnormalities identified in the spleen. 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 Adrenals/Urinary Tract: The adrenal glands are normal. There is a small simple cysts in the lateral mid to lower pole right kidney. No focal left kidney lesion is noted. There is no hydronephrosis bilaterally. The bladder is decompressed limiting evaluation. Stomach/Bowel: Multiple dilated small bowel loops are identified in the upper and mid abdomen. Colon is decompressed with mild diffuse bowel wall thickening probably secondary to surrounding ascites. Contrast is noted in the colon. Vascular/Lymphatic: Aortic atherosclerosis. No enlarged abdominal or pelvic lymph nodes. Reproductive: The uterus is unremarkable. The ovaries are not definitely seen. There is an extensive amount of  ascites throughout the abdomen and pelvis. Innumerable omental and peritoneal heterogeneously enhancing masses are identified throughout the abdomen and pelvis. Other: Extensive ascites identified in the abdomen and pelvis. Musculoskeletal: No acute abnormality identified. IMPRESSION: Findings consistent with extensive lung, liver metastasis, peritoneal and omental metastasis with large amount of ascites in the abdomen and pelvis. Multiple dilated small bowel loops in the upper and mid abdomen with contrast noted in the colon, probably due to ileus. Electronically Signed   By: Abelardo Diesel M.D.   On: 02/09/2017 16:13     LOS: 0 days   Oren Binet, MD  [facility] Pager:[phone]  If 7PM-7AM, please contact night-coverage [email or web address] Password [email or web address] 02/14/2017, 10:00 AM

## 2017-02-14 NOTE — Progress Notes (Signed)
Nutrition Brief Note  Patient identified on the Malnutrition Screening Tool (MST) Report  Wt Readings from Last 15 Encounters:  02/14/17 121 lb 11.1 oz (55.2 kg)  02/12/2017 111 lb 12.8 oz (50.7 kg)  08/01/16 115 lb 9.6 oz (52.4 kg)  03/01/15 118 lb 9.6 oz (53.8 kg)  11/16/14 118 lb 3.2 oz (53.6 kg)  09/17/14 118 lb 9.6 oz (53.8 kg)   Spoke with daughter at bedside. Reports pt had loss in appetite for the last two weeks due to ongoing abdominal pain. This has since resolved. Pt eating pork chops with baked potato during RD visit. Tolerating without any complaints. Daughter asking about supplementation at home. Discussed options pt could have if needed. Pt denies any recent wt loss. UBW stays around 111 lb.   Body mass index is 25 kg/m. Patient meets criteria for overweight based on current BMI.   Current diet order is carb modified. Labs and medications reviewed.   No nutrition interventions warranted at this time. If nutrition issues arise, please consult RD.   Mariana Single RD, LDN Clinical Nutrition Pager # [phone]

## 2017-02-14 NOTE — H&P (Signed)
History and Physical    Jessica Gregory ZOX:[id number] DOB: Sep 11, 1950 DOA: 02/07/2017  PCP: Forrest Moron, MD Patient coming from: Urgent care clinic  Chief Complaint: Not feeling well  HPI: Jessica Gregory is a 67 y.o. female with medical history significant of history of diabetes mellitus type 2, hyperlipidemia was sent to the ER for further evaluation due to abnormal CT scan and and patient feeling generally ill.  Patient has had abdominal discomfort for past couple of months and due to worsening of this she was seen at [facility] urgent care today and underwent CT scanning which showed concerns for widespread metastatic disease therefore sent to the ER for further evaluation.  She is also been febrile, tachycardic with mild nonproductive cough. According to the daughter patient has not been seeing any physician and was diagnosed with diabetes mellitus type 2 about 6 months ago and has been on metformin.  No other complaints including nausea, vomiting, unintentional weight loss.  She has had poor appetite over the last 2 weeks and attributes it to less desire to eat.  Previously patient has not had any screening procedures/imaging such as colonoscopy or mammography.  Denies any family history of malignancies.  Patient denies any tobacco or alcohol use.  In the ER patient was noted to be febrile when checked rectal temperature and chronically ill-appearing.  She was also slightly tachycardic at around 115.  Her UA was suggestive of urinary tract infection.  On physical exam she was noted to be very dehydrated.  CT of the chest abdomen pelvis reviewed showing widespread metastatic disease.  She was started on broad-spectrum antibiotics and it was determined to admit her for further care evaluation.   Review of Systems: As per HPI otherwise 10 point review of systems negative.   Past Medical History:  Diagnosis Date  . Language barrier 11/16/2014   From [place], does not yet speak English     History reviewed. No pertinent surgical history.   reports that  has never smoked. she has never used smokeless tobacco. She reports that she does not drink alcohol or use drugs.  No Known Allergies  No family history on file.   Prior to Admission medications   Medication Sig Start Date End Date Taking? Authorizing Provider  metFORMIN (GLUCOPHAGE) 500 MG tablet Take 1 tablet (500 mg total) by mouth 2 (two) times daily with a meal. 08/01/16  Yes Stallings, Zoe A, MD  Phenylephrine-DM-GG-APAP (TYLENOL COLD/FLU SEVERE PO) Take 2 tablets by mouth every 6 (six) hours as needed (cold/flu symptoms).   Yes [provider]  canagliflozin (INVOKANA) 100 MG TABS tablet Take 1 tablet (100 mg total) by mouth daily before breakfast. Patient not taking: Reported on 02/21/2017 08/01/16   Forrest Moron, MD  clobetasol cream (TEMOVATE) 8.11 % Apply 1 application topically 2 (two) times daily. Patient not taking: Reported on 02/23/2017 08/01/16   Forrest Moron, MD    Physical Exam: Vitals:   02/08/2017 2000 02/05/2017 2030 02/25/2017 2130 02/10/2017 2200  BP: (!) 124/92 136/67 (!) 158/80 (!) 154/74  Pulse: (!) 129 (!) 134 (!) 122 (!) 134  Resp: 18  (!) 26 (!) 33  Temp:      TempSrc:      SpO2: 94% 94% 98% 98%      Constitutional: Ill frail-appearing Vitals:   01/29/2017 2000 02/12/2017 2030 02/07/2017 2130 01/31/2017 2200  BP: (!) 124/92 136/67 (!) 158/80 (!) 154/74  Pulse: (!) 129 (!) 134 (!) 122 (!) 134  Resp:  18  (!) 26 (!) 33  Temp:      TempSrc:      SpO2: 94% 94% 98% 98%   Eyes: PERRL, lids and conjunctivae normal ENMT: Mucous membranes are dry. Posterior pharynx clear of any exudate or lesions.Normal dentition.  Neck: normal, supple, no masses, no thyromegaly Respiratory: Diffuse coarse breath sounds Cardiovascular: Sinus tachycardia, regular rate and rhythm, no murmurs / rubs / gallops. No extremity edema. 2+ pedal pulses. No carotid bruits.  Abdomen: no tenderness, no masses  palpated. No hepatosplenomegaly. Bowel sounds positive.  Musculoskeletal: no clubbing / cyanosis. No joint deformity upper and lower extremities. Good ROM, no contractures. Normal muscle tone.  Skin: no rashes, lesions, ulcers. No induration Neurologic: CN 2-12 grossly intact. Sensation intact, DTR normal. Strength 5/5 in all 4.  Psychiatric: Normal judgment and insight. Alert and oriented x 3. Normal mood.     Labs on Admission: I have personally reviewed following labs and imaging studies  CBC: Recent Labs  Lab 01/30/2017 1105 02/10/2017 1117 02/03/2017 1724  WBC 12.8* 13.6* 15.4*  NEUTROABS  --  10.5*  --   HGB 14.8 14.8 14.4  HCT 45.5 46.1 44.4  MCV 81.0 81 81.8  PLT  --  322 220   Basic Metabolic Panel: Recent Labs  Lab 02/18/2017 1048 02/21/2017 1427 02/18/2017 1524 01/31/2017 1724  NA 138 138  --  132*  K 4.3 4.1  --  3.8  CL 97 97  --  96*  CO2 20 20  --  24  GLUCOSE 174* 170*  --  166*  BUN 12 12  --  15  CREATININE 0.66 0.60 0.70 0.68  CALCIUM 8.9 8.9  --  8.4*   GFR: Estimated Creatinine Clearance: 50.4 mL/min (by C-G formula based on SCr of 0.68 mg/dL). Liver Function Tests: Recent Labs  Lab 02/03/2017 1048 02/11/2017 1427 02/23/2017 1724  AST 23 26 25   ALT 23 26 25   ALKPHOS 105 102 90  BILITOT 0.4 0.4 0.7  PROT 6.5 6.4 6.7  ALBUMIN 3.1* 3.1* 2.6*   Recent Labs  Lab 02/22/2017 1724  LIPASE 14   No results for input(s): AMMONIA in the last 168 hours. Coagulation Profile: No results for input(s): INR, PROTIME in the last 168 hours. Cardiac Enzymes: No results for input(s): CKTOTAL, CKMB, CKMBINDEX, TROPONINI in the last 168 hours. BNP (last 3 results) No results for input(s): PROBNP in the last 8760 hours. HbA1C: Recent Labs    02/17/2017 1103  HGBA1C 9.4   CBG: No results for input(s): GLUCAP in the last 168 hours. Lipid Profile: Recent Labs    02/02/2017 1048  CHOL 136  HDL 29*  LDLCALC 79  TRIG 141  CHOLHDL 4.7*   Thyroid Function Tests: No  results for input(s): TSH, T4TOTAL, FREET4, T3FREE, THYROIDAB in the last 72 hours. Anemia Panel: No results for input(s): VITAMINB12, FOLATE, FERRITIN, TIBC, IRON, RETICCTPCT in the last 72 hours. Urine analysis:    Component Value Date/Time   COLORURINE YELLOW 02/17/2017 1848   APPEARANCEUR CLEAR 01/30/2017 1848   LABSPEC >1.046 (H) 02/23/2017 1848   PHURINE 6.0 02/01/2017 1848   GLUCOSEU NEGATIVE 02/10/2017 1848   HGBUR SMALL (A) 02/24/2017 1848   BILIRUBINUR NEGATIVE 02/20/2017 1848   BILIRUBINUR moderate (A) 02/03/2017 1127   KETONESUR 80 (A) 02/25/2017 1848   PROTEINUR NEGATIVE 02/21/2017 1848   UROBILINOGEN 1.0 02/02/2017 1127   NITRITE NEGATIVE 02/19/2017 1848   LEUKOCYTESUR MODERATE (A) 02/17/2017 1848   Sepsis Labs: !!!!!!!!!!!!!!!!!!!!!!!!!!!!!!!!!!!!!!!!!!!! @LABRCNTIP (procalcitonin:4,lacticidven:4) )No  results found for this or any previous visit (from the past 240 hour(s)).   Radiological Exams on Admission: Dg Chest 2 View  Result Date: 02/11/2017 CLINICAL DATA:  67 y/o  F; fever and increasing shortness of breath. EXAM: CHEST  2 VIEW COMPARISON:  01/08/2013 chest radiograph FINDINGS: Stable normal cardiac silhouette given projection and technique. Diffuse patchy opacifications. No pleural effusion or pneumothorax. No acute osseous abnormality is evident. IMPRESSION: Diffuse patchy opacification of the lungs probably representing multifocal pneumonia. Electronically Signed   By: Kristine Garbe M.D.   On: 02/27/2017 22:26   Ct Angio Chest Pe W/cm &/or Wo Cm  Result Date: 02/06/2017 CLINICAL DATA:  Cancer cells and elevated white cells EXAM: CT ANGIOGRAPHY CHEST WITH CONTRAST TECHNIQUE: Multidetector CT imaging of the chest was performed using the standard protocol during bolus administration of intravenous contrast. Multiplanar CT image reconstructions and MIPs were obtained to evaluate the vascular anatomy. CONTRAST:  125mL ISOVUE-370 IOPAMIDOL (ISOVUE-370)  INJECTION 76% COMPARISON:  01/31/2017 radiograph, CT abdomen pelvis 02/01/2017 FINDINGS: Cardiovascular: Satisfactory opacification of the pulmonary arteries to the segmental level. Respiratory motion artifact limits assessment for emboli particularly in the lower lobes and right middle lobe. No central filling defects are visualized. Nonaneurysmal aorta. Mild aortic atherosclerosis. Normal heart size. No pericardial effusion Mediastinum/Nodes: 9 mm lymph node adjacent to the left aortic arch. Midline trachea. No thyroid mass. Esophagus within normal limits Lungs/Pleura: Innumerable bilateral pulmonary nodules consistent with extensive metastatic disease. Mild bilateral septal thickening with nodularity, possible lymphangitic tumor spread. No pleural effusion or pneumothorax. Upper Abdomen: Ascites in the upper abdomen with partially visualized hepatic mass lesion and multiple mesenteric/omental nodules in the left upper quadrant. Musculoskeletal: No acute or suspicious lesion Review of the MIP images confirms the above findings. IMPRESSION: 1. Respiratory motion artifact limits evaluation for emboli as discussed above. No definite central PE is seen 2. Extensive pulmonary nodules consistent with widespread metastatic disease. 3. Ascites in the upper abdomen with partially visible hepatic metastatic lesion and multiple metastatic foci in the left upper quadrant of the abdomen Aortic Atherosclerosis (ICD10-I70.0). Electronically Signed   By: Donavan Foil M.D.   On: 02/16/2017 23:49   Ct Abdomen Pelvis W Contrast  Result Date: 02/12/2017 CLINICAL DATA:  Left abdomen pain since November. Assess for diverticulitis. EXAM: CT ABDOMEN AND PELVIS WITH CONTRAST TECHNIQUE: Multidetector CT imaging of the abdomen and pelvis was performed using the standard protocol following bolus administration of intravenous contrast. CONTRAST:  113mL ISOVUE-300 IOPAMIDOL (ISOVUE-300) INJECTION 61% COMPARISON:  Ultrasound December 30, 2003 FINDINGS: Lower chest: Innumerable small nodules identified throughout the lung bases. The heart size is normal. Hepatobiliary: Several heterogeneous enhancing masses are identified within the liver, largest is in the right/left lobe liver measuring 7.4 x 4.6 cm. The largest mass is inseparable from the adjacent gallbladder/adjacent invasion is not excluded. The common bile duct is normal caliber at the head of pancreas. Pancreas: Unremarkable. No pancreatic ductal dilatation or surrounding inflammatory changes. Spleen: No acute abnormalities identified in the spleen. 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 Adrenals/Urinary Tract: The adrenal glands are normal. There is a small simple cysts in the lateral mid to lower pole right kidney. No focal left kidney lesion is noted. There is no hydronephrosis bilaterally. The bladder is decompressed limiting evaluation. Stomach/Bowel: Multiple dilated small bowel loops are identified in the upper and mid abdomen. Colon is decompressed with mild diffuse bowel wall thickening probably secondary to surrounding ascites. Contrast is noted in the colon. Vascular/Lymphatic:  Aortic atherosclerosis. No enlarged abdominal or pelvic lymph nodes. Reproductive: The uterus is unremarkable. The ovaries are not definitely seen. There is an extensive amount of ascites throughout the abdomen and pelvis. Innumerable omental and peritoneal heterogeneously enhancing masses are identified throughout the abdomen and pelvis. Other: Extensive ascites identified in the abdomen and pelvis. Musculoskeletal: No acute abnormality identified. IMPRESSION: Findings consistent with extensive lung, liver metastasis, peritoneal and omental metastasis with large amount of ascites in the abdomen and pelvis. Multiple dilated small bowel loops in the upper and mid abdomen with contrast noted in the colon, probably due to ileus. Electronically Signed   By: Abelardo Diesel M.D.   On:  02/15/2017 16:13    EKG: Independently reviewed.   Assessment/Plan Active Problems:   Sepsis ([place])    Sepsis due to urinary tract infection Multifocal pneumonia? Vs only Metastatic cancer -Admit the patient for further care and management to telemetry - Cultures have been ordered including urine, start the patient on broad-spectrum antibiotics vancomycin and Zosyn - We will check pro-calcitonin, and nebulizers as needed -Supplemental oxygen.  Provide supportive care, IV fluids  Widespread metastatic disease with unknown primary - Patient denies any screening in the past including mammography, gyn screening and colonoscopy -We will need tissue diagnosis, MRI brain.  Consult IR in the morning to determine was the best place to biopsy-liver?[facility]  Also consult oncology -Provide supportive care  Sinus tachycardia -Multifactorial- patient's febrile and also from pulmonary burden from metastatic disease.  Closely monitor heart rate.  Tylenol  Diabetes mellitus type 2 -We will hold her home metformin as she just received contrast -Accu-Chek and sliding scale    DVT prophylaxis: SCDs for now in case patient needs a procedure tomorrow, if not she was started on Lovenox. Code Status: Full code Family Communication: Son-in-law and husband at bedside.  Spoke with patient's daughter over the phone Disposition Plan:   To be determined Consults called: Will need IR and oncology consult in the morning Admission status: Inpatient admission   Prisha Hiley Arsenio Loader MD [facility] Pager [phone]  If 7PM-7AM, please contact night-coverage [email or web address] Password [facility]  02/14/2017, 12:35 AM

## 2017-02-15 ENCOUNTER — Ambulatory Visit: Payer: Medicare Other | Admitting: Family Medicine

## 2017-02-15 DIAGNOSIS — C799 Secondary malignant neoplasm of unspecified site: Secondary | ICD-10-CM

## 2017-02-15 LAB — BASIC METABOLIC PANEL
ANION GAP: 7 (ref 5–15)
BUN: 7 mg/dL (ref 6–20)
CALCIUM: 7.5 mg/dL — AB (ref 8.9–10.3)
CO2: 23 mmol/L (ref 22–32)
CREATININE: 0.5 mg/dL (ref 0.44–1.00)
Chloride: 100 mmol/L — ABNORMAL LOW (ref 101–111)
GLUCOSE: 196 mg/dL — AB (ref 65–99)
Potassium: 3.4 mmol/L — ABNORMAL LOW (ref 3.5–5.1)
Sodium: 130 mmol/L — ABNORMAL LOW (ref 135–145)

## 2017-02-15 LAB — CBC
HEMATOCRIT: 42.5 % (ref 36.0–46.0)
Hemoglobin: 13.7 g/dL (ref 12.0–15.0)
MCH: 26 pg (ref 26.0–34.0)
MCHC: 32.2 g/dL (ref 30.0–36.0)
MCV: 80.8 fL (ref 78.0–100.0)
PLATELETS: 301 10*3/uL (ref 150–400)
RBC: 5.26 MIL/uL — ABNORMAL HIGH (ref 3.87–5.11)
RDW: 13.3 % (ref 11.5–15.5)
WBC: 12 10*3/uL — AB (ref 4.0–10.5)

## 2017-02-15 LAB — PROCALCITONIN: PROCALCITONIN: 0.83 ng/mL

## 2017-02-15 LAB — GLUCOSE, CAPILLARY
GLUCOSE-CAPILLARY: 195 mg/dL — AB (ref 65–99)
GLUCOSE-CAPILLARY: 183 mg/dL — AB (ref 65–99)
Glucose-Capillary: 201 mg/dL — ABNORMAL HIGH (ref 65–99)
Glucose-Capillary: 184 mg/dL — ABNORMAL HIGH (ref 65–99)
Glucose-Capillary: 144 mg/dL — ABNORMAL HIGH (ref 65–99)

## 2017-02-15 MED ORDER — MORPHINE SULFATE ER 30 MG PO TBCR
30.0000 mg | EXTENDED_RELEASE_TABLET | Freq: Two times a day (BID) | ORAL | Status: DC
  Administered 2017-02-15 (×2): 30 mg via ORAL
  Filled 2017-02-15 (×2): qty 1

## 2017-02-15 MED ORDER — POTASSIUM CHLORIDE IN NACL 20-0.9 MEQ/L-% IV SOLN
INTRAVENOUS | Status: DC
Start: 2017-02-15 — End: 2017-02-16
  Administered 2017-02-15 – 2017-02-16 (×2): via INTRAVENOUS
  Filled 2017-02-15 (×2): qty 1000

## 2017-02-15 MED ORDER — BISACODYL 5 MG PO TBEC
5.0000 mg | DELAYED_RELEASE_TABLET | Freq: Every day | ORAL | Status: DC
  Administered 2017-02-15 – 2017-02-16 (×2): 5 mg via ORAL
  Filled 2017-02-15 (×2): qty 1

## 2017-02-15 MED ORDER — LEVALBUTEROL HCL 0.63 MG/3ML IN NEBU
0.6300 mg | INHALATION_SOLUTION | Freq: Once | RESPIRATORY_TRACT | Status: AC | PRN
  Administered 2017-02-15: 0.63 mg via RESPIRATORY_TRACT
  Filled 2017-02-15: qty 3

## 2017-02-15 MED ORDER — POTASSIUM CHLORIDE CRYS ER 20 MEQ PO TBCR
40.0000 meq | EXTENDED_RELEASE_TABLET | Freq: Four times a day (QID) | ORAL | Status: AC
  Administered 2017-02-15 (×2): 40 meq via ORAL
  Filled 2017-02-15 (×2): qty 2

## 2017-02-15 MED ORDER — FUROSEMIDE 20 MG PO TABS
20.0000 mg | ORAL_TABLET | Freq: Once | ORAL | Status: AC
  Administered 2017-02-15: 20 mg via ORAL
  Filled 2017-02-15: qty 1

## 2017-02-15 MED ORDER — ONDANSETRON HCL 4 MG/2ML IJ SOLN
4.0000 mg | Freq: Four times a day (QID) | INTRAMUSCULAR | Status: DC | PRN
  Administered 2017-02-15 (×2): 4 mg via INTRAVENOUS
  Filled 2017-02-15 (×2): qty 2

## 2017-02-15 NOTE — Progress Notes (Signed)
Report received by ongoing nurse. Agreed with RN assessment of patient and will cont to monitor.  

## 2017-02-15 NOTE — Progress Notes (Signed)
PROGRESS NOTE        PATIENT DETAILS Name: Jessica Gregory Age: 67 y.o. Sex: female Date of Birth: 20-Dec-1950 Admit Date: 02/25/2017 Admitting Physician Ankit Arsenio Loader, MD GEX:[id number], Arlie Solomons, MD  Brief Narrative: Patient is a 67 y.o. female with history of DM-2 who was referred to the emergency room by her primary care practitioner for evaluation of an abnormal CT scan.  Apparently, since this past November-patient has had worsening abdominal pain and distention, a CT scan of the abdomen done in the outpatient setting showed extensive lung, liver, peritoneal and omental metastases with large amount of abscess.  Primary tumor is currently not evident.  Also-for the past few days, patient has been having subjective fever, cough and other URI symptoms.  See below for further details.  Subjective:  Patient in bed, in no acute distress but complains of some ongoing abdominal pain and nausea, no headache, no chest pain or shortness of breath.  Assessment/Plan:  Newly diagnosed extensive intra-abdominal and pulmonary metastases without obvious primary lesion: Status post diagnostic and therapeutic paracentesis on 02/14/2017, cytology results awaited, if inconclusive she will get CT-guided biopsy through IR for definitive diagnosis, continue supportive care, oncology consult once pathology results back.  UTI: Clinically no sepsis on Rocephin follow cultures.  Hyponatremia.  Stop D5, gentle Lasix thereafter normal saline and monitor.    DM-2: CBGs relatively stable with SSI-continue to hold all oral hypoglycemic agents.  CBG (last 3)  Recent Labs    02/14/17 1725 02/14/17 2141 02/15/17 0817  GLUCAP 151* 195* 201*     DVT Prophylaxis: SCD's  Code Status: Full code   Family Communication:  at bedside  Disposition Plan: Remain inpatient  Antimicrobial agents: Anti-infectives (From admission, onward)   Start     Dose/Rate Route Frequency Ordered Stop   02/15/17 1200  vancomycin (VANCOCIN) IVPB 1000 mg/200 mL premix  Status:  Discontinued     1,000 mg 200 mL/hr over 60 Minutes Intravenous Every 36 hours 02/14/17 0303 02/14/17 1026   02/14/17 1500  cefTRIAXone (ROCEPHIN) 1 g in dextrose 5 % 50 mL IVPB     1 g 100 mL/hr over 30 Minutes Intravenous Every 24 hours 02/14/17 1026     02/14/17 0600  piperacillin-tazobactam (ZOSYN) IVPB 3.375 g  Status:  Discontinued     3.375 g 12.5 mL/hr over 240 Minutes Intravenous Every 8 hours 02/14/17 0304 02/14/17 1026   02/06/2017 2330  piperacillin-tazobactam (ZOSYN) IVPB 3.375 g     3.375 g 100 mL/hr over 30 Minutes Intravenous  Once 02/17/2017 2317 02/14/17 0028   01/31/2017 2330  vancomycin (VANCOCIN) IVPB 1000 mg/200 mL premix     1,000 mg 200 mL/hr over 60 Minutes Intravenous  Once 02/17/2017 2317 02/14/17 0159      Procedures: None  CONSULTS:  IR  Time spent: 25- minutes-Greater than 50% of this time was spent in counseling, explanation of diagnosis, planning of further management, and coordination of care.  MEDICATIONS: Scheduled Meds: . bisacodyl  5 mg Oral Daily  . feeding supplement (GLUCERNA SHAKE)  237 mL Oral TID BM  . Influenza vac split quadrivalent PF  0.5 mL Intramuscular Tomorrow-1000  . insulin aspart  0-5 Units Subcutaneous QHS  . insulin aspart  0-9 Units Subcutaneous TID WC  . ipratropium-albuterol  3 mL Nebulization BID  . morphine  30 mg Oral Q12H  .  potassium chloride  40 mEq Oral Q6H   Continuous Infusions: . cefTRIAXone (ROCEPHIN)  IV Stopped (02/15/17 0835)  . dextrose 5 % and 0.45% NaCl 75 mL/hr at 02/14/17 2334   PRN Meds:.acetaminophen **OR** acetaminophen, benzonatate, bisacodyl, HYDROcodone-acetaminophen, morphine injection, ondansetron (ZOFRAN) IV, senna-docusate   PHYSICAL EXAM: Vital signs: Vitals:   02/14/17 2100 02/15/17 0154 02/15/17 0227 02/15/17 0550  BP: 133/72 126/66  139/85  Pulse: (!) 121 (!) 122  (!) 114  Resp: 20 (!) 22  18  Temp: 98.6 F  (37 C)   98.4 F (36.9 C)  TempSrc: Oral   Oral  SpO2: 95% 94% 93% 95%  Weight:    53.1 kg (117 lb 1 oz)  Height:       Filed Weights   02/14/17 0222 02/15/17 0550  Weight: 55.2 kg (121 lb 11.1 oz) 53.1 kg (117 lb 1 oz)   Body mass index is 24.05 kg/m.   Exam  Awake Alert, Oriented X 3, No new F.N deficits, Normal affect [place].AT,PERRAL Supple Neck,No JVD, No cervical lymphadenopathy appriciated.  Symmetrical Chest wall movement, Good air movement bilaterally, CTAB RRR,No Gallops, Rubs or new Murmurs, No Parasternal Heave +ve B.Sounds, Abd Soft but mildly distended, No tenderness, No organomegaly appriciated, No rebound - guarding or rigidity. No Cyanosis, Clubbing or edema, No new Rash or bruise  I have personally reviewed following labs and imaging studies  LABORATORY DATA: CBC: Recent Labs  Lab 02/16/2017 1105 02/22/2017 1117 01/31/2017 1724 02/14/17 0223 02/15/17 0417  WBC 12.8* 13.6* 15.4* 16.9* 12.0*  NEUTROABS  --  10.5*  --   --   --   HGB 14.8 14.8 14.4 12.6 13.7  HCT 45.5 46.1 44.4 39.4 42.5  MCV 81.0 81 81.8 81.7 80.8  PLT  --  322 323 253 016    Basic Metabolic Panel: Recent Labs  Lab 02/12/2017 1048 01/29/2017 1427 02/11/2017 1524 02/19/2017 1724 02/14/17 0223 02/15/17 0417  NA 138 138  --  132* 132* 130*  K 4.3 4.1  --  3.8 3.5 3.4*  CL 97 97  --  96* 102 100*  CO2 20 20  --  24 20* 23  GLUCOSE 174* 170*  --  166* 191* 196*  BUN 12 12  --  15 13 7   CREATININE 0.66 0.60 0.70 0.68 0.66 0.50  CALCIUM 8.9 8.9  --  8.4* 7.5* 7.5*    GFR: Estimated Creatinine Clearance: 51.5 mL/min (by C-G formula based on SCr of 0.5 mg/dL).  Liver Function Tests: Recent Labs  Lab 02/19/2017 1048 02/14/2017 1427 02/23/2017 1724 02/14/17 0223  AST 23 26 25 23   ALT 23 26 25 19   ALKPHOS 105 102 90 71  BILITOT 0.4 0.4 0.7 1.3*  PROT 6.5 6.4 6.7 5.6*  ALBUMIN 3.1* 3.1* 2.6* 2.3*   Recent Labs  Lab 02/12/2017 1117 02/21/2017 1724  LIPASE 8* 14   No results for input(s):  AMMONIA in the last 168 hours.  Coagulation Profile: Recent Labs  Lab 02/14/17 0223  INR 1.08    Cardiac Enzymes: No results for input(s): CKTOTAL, CKMB, CKMBINDEX, TROPONINI in the last 168 hours.  BNP (last 3 results) No results for input(s): PROBNP in the last 8760 hours.  HbA1C: Recent Labs    02/20/2017 1103  HGBA1C 9.4    CBG: Recent Labs  Lab 02/14/17 0835 02/14/17 1132 02/14/17 1725 02/14/17 2141 02/15/17 0817  GLUCAP 232* 210* 151* 195* 201*    Lipid Profile: Recent Labs    01/31/2017  1048  CHOL 136  HDL 29*  LDLCALC 79  TRIG 141  CHOLHDL 4.7*    Thyroid Function Tests: No results for input(s): TSH, T4TOTAL, FREET4, T3FREE, THYROIDAB in the last 72 hours.  Anemia Panel: No results for input(s): VITAMINB12, FOLATE, FERRITIN, TIBC, IRON, RETICCTPCT in the last 72 hours.  Urine analysis:    Component Value Date/Time   COLORURINE YELLOW 02/07/2017 1848   APPEARANCEUR CLEAR 02/19/2017 1848   LABSPEC >1.046 (H) 02/26/2017 1848   PHURINE 6.0 02/06/2017 1848   GLUCOSEU NEGATIVE 02/18/2017 1848   HGBUR SMALL (A) 02/11/2017 1848   BILIRUBINUR NEGATIVE 02/06/2017 1848   BILIRUBINUR moderate (A) 02/23/2017 1127   KETONESUR 80 (A) 02/20/2017 1848   PROTEINUR NEGATIVE 02/08/2017 1848   UROBILINOGEN 1.0 02/02/2017 1127   NITRITE NEGATIVE 02/06/2017 1848   LEUKOCYTESUR MODERATE (A) 02/16/2017 1848    Sepsis Labs: Lactic Acid, Venous    Component Value Date/Time   LATICACIDVEN 1.78 02/09/2017 2110    MICROBIOLOGY: Recent Results (from the past 240 hour(s))  Blood Culture (routine x 2)     Status: None (Preliminary result)   Collection Time: 01/28/2017  8:28 PM  Result Value Ref Range Status   Specimen Description BLOOD RIGHT ANTECUBITAL  Final   Special Requests   Final    BOTTLES DRAWN AEROBIC AND ANAEROBIC Blood Culture adequate volume   Culture   Final    NO GROWTH < 12 HOURS Performed at [facility], [place] [address] [place],  [place] [address]    Report Status PENDING  Incomplete  Blood Culture (routine x 2)     Status: None (Preliminary result)   Collection Time: 01/28/2017  8:33 PM  Result Value Ref Range Status   Specimen Description BLOOD RIGHT WRIST  Final   Special Requests   Final    BOTTLES DRAWN AEROBIC AND ANAEROBIC Blood Culture adequate volume   Culture   Final    NO GROWTH < 12 HOURS Performed at [facility], [place] [address] [place], [place] [address]    Report Status PENDING  Incomplete  Gram stain     Status: None   Collection Time: 02/14/17  9:58 AM  Result Value Ref Range Status   Specimen Description PERITONEAL  Final   Special Requests NONE  Final   Gram Stain   Final    CYTOSPIN SMEAR WBC PRESENT,BOTH PMN AND MONONUCLEAR NO ORGANISMS SEEN Performed at [facility], [place] [address], [place], [place] [address]    Report Status 02/14/2017 FINAL  Final  MRSA PCR Screening     Status: None   Collection Time: 02/14/17 11:55 AM  Result Value Ref Range Status   MRSA by PCR NEGATIVE NEGATIVE Final    Comment:        The GeneXpert MRSA Assay (FDA approved for NASAL specimens only), is one component of a comprehensive MRSA colonization surveillance program. It is not intended to diagnose MRSA infection nor to guide or monitor treatment for MRSA infections.     RADIOLOGY STUDIES/RESULTS: Dg Chest 2 View  Result Date: 01/28/2017 CLINICAL DATA:  67 y/o  F; fever and increasing shortness of breath. EXAM: CHEST  2 VIEW COMPARISON:  01/08/2013 chest radiograph FINDINGS: Stable normal cardiac silhouette given projection and technique. Diffuse patchy opacifications. No pleural effusion or pneumothorax. No acute osseous abnormality is evident. IMPRESSION: Diffuse patchy opacification of the lungs probably representing multifocal pneumonia. Electronically Signed   By: Edgardo Roys.D.  On: 01/29/2017 22:26   Ct Angio Chest Pe W/cm &/or Wo Cm  Result Date: 02/02/2017 CLINICAL  DATA:  Cancer cells and elevated white cells EXAM: CT ANGIOGRAPHY CHEST WITH CONTRAST TECHNIQUE: Multidetector CT imaging of the chest was performed using the standard protocol during bolus administration of intravenous contrast. Multiplanar CT image reconstructions and MIPs were obtained to evaluate the vascular anatomy. CONTRAST:  143mL ISOVUE-370 IOPAMIDOL (ISOVUE-370) INJECTION 76% COMPARISON:  02/18/2017 radiograph, CT abdomen pelvis 02/06/2017 FINDINGS: Cardiovascular: Satisfactory opacification of the pulmonary arteries to the segmental level. Respiratory motion artifact limits assessment for emboli particularly in the lower lobes and right middle lobe. No central filling defects are visualized. Nonaneurysmal aorta. Mild aortic atherosclerosis. Normal heart size. No pericardial effusion Mediastinum/Nodes: 9 mm lymph node adjacent to the left aortic arch. Midline trachea. No thyroid mass. Esophagus within normal limits Lungs/Pleura: Innumerable bilateral pulmonary nodules consistent with extensive metastatic disease. Mild bilateral septal thickening with nodularity, possible lymphangitic tumor spread. No pleural effusion or pneumothorax. Upper Abdomen: Ascites in the upper abdomen with partially visualized hepatic mass lesion and multiple mesenteric/omental nodules in the left upper quadrant. Musculoskeletal: No acute or suspicious lesion Review of the MIP images confirms the above findings. IMPRESSION: 1. Respiratory motion artifact limits evaluation for emboli as discussed above. No definite central PE is seen 2. Extensive pulmonary nodules consistent with widespread metastatic disease. 3. Ascites in the upper abdomen with partially visible hepatic metastatic lesion and multiple metastatic foci in the left upper quadrant of the abdomen Aortic Atherosclerosis (ICD10-I70.0). Electronically Signed   By: Donavan Foil M.D.   On: 02/27/2017 23:49   Ct Abdomen Pelvis W Contrast  Result Date:  02/16/2017 CLINICAL DATA:  Left abdomen pain since November. Assess for diverticulitis. EXAM: CT ABDOMEN AND PELVIS WITH CONTRAST TECHNIQUE: Multidetector CT imaging of the abdomen and pelvis was performed using the standard protocol following bolus administration of intravenous contrast. CONTRAST:  169mL ISOVUE-300 IOPAMIDOL (ISOVUE-300) INJECTION 61% COMPARISON:  Ultrasound December 30, 2003 FINDINGS: Lower chest: Innumerable small nodules identified throughout the lung bases. The heart size is normal. Hepatobiliary: Several heterogeneous enhancing masses are identified within the liver, largest is in the right/left lobe liver measuring 7.4 x 4.6 cm. The largest mass is inseparable from the adjacent gallbladder/adjacent invasion is not excluded. The common bile duct is normal caliber at the head of pancreas. Pancreas: Unremarkable. No pancreatic ductal dilatation or surrounding inflammatory changes. Spleen: No acute abnormalities identified in the spleen. 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 Adrenals/Urinary Tract: The adrenal glands are normal. There is a small simple cysts in the lateral mid to lower pole right kidney. No focal left kidney lesion is noted. There is no hydronephrosis bilaterally. The bladder is decompressed limiting evaluation. Stomach/Bowel: Multiple dilated small bowel loops are identified in the upper and mid abdomen. Colon is decompressed with mild diffuse bowel wall thickening probably secondary to surrounding ascites. Contrast is noted in the colon. Vascular/Lymphatic: Aortic atherosclerosis. No enlarged abdominal or pelvic lymph nodes. Reproductive: The uterus is unremarkable. The ovaries are not definitely seen. There is an extensive amount of ascites throughout the abdomen and pelvis. Innumerable omental and peritoneal heterogeneously enhancing masses are identified throughout the abdomen and pelvis. Other: Extensive ascites identified in the abdomen and pelvis.  Musculoskeletal: No acute abnormality identified. IMPRESSION: Findings consistent with extensive lung, liver metastasis, peritoneal and omental metastasis with large amount of ascites in the abdomen and pelvis. Multiple dilated small bowel loops in the upper and mid abdomen with  contrast noted in the colon, probably due to ileus. Electronically Signed   By: Abelardo Diesel M.D.   On: 02/10/2017 16:13   US Paracentesis  Result Date: 02/14/2017 INDICATION: Abdominal distention secondary to ascites. Abnormal imaging studies highly concerning for metastatic process. Request for diagnostic and therapeutic paracentesis. EXAM: ULTRASOUND GUIDED RIGHT LOWER QUADRANT PARACENTESIS MEDICATIONS: N None one. COMPLICATIONS: None immediate. PROCEDURE: Informed written consent was obtained from the patient after a discussion of the risks, benefits and alternatives to treatment. A timeout was performed prior to the initiation of the procedure. Initial ultrasound scanning demonstrates a large amount of ascites within the right lower abdominal quadrant. The right lower abdomen was prepped and draped in the usual sterile fashion. 1% lidocaine with epinephrine was used for local anesthesia. Following this, a 19 gauge, 7-cm, Yueh catheter was introduced. An ultrasound image was saved for documentation purposes. The paracentesis was performed. The catheter was removed and a dressing was applied. The patient tolerated the procedure well without immediate post procedural complication. FINDINGS: A total of approximately 2.5 L of slightly hazy, yellow fluid was removed. Samples were sent to the laboratory as requested by the clinical team. IMPRESSION: Successful ultrasound-guided paracentesis yielding 2.5 liters of peritoneal fluid. Read by: Ascencion Dike PA-C Electronically Signed   By: Markus Daft M.D.   On: 02/14/2017 11:20     LOS: 1 day   Signature  Lala Lund M.D on 02/15/2017 at 11:55 AM  Between 7am to 7pm - Pager -  [phone] ( page via [email or web address], text pages only, please mention full 10 digit call back number).  After 7pm go to [email or web address] - password [email or web address]

## 2017-02-15 NOTE — Progress Notes (Signed)
Patient c/o constant abdominal pain this shift, rated 8 out of 10. Remains in ST 110s- low 120s. Also continues to cough and have bilateral wheezing & course lung sounds. Increased respiratory rate and labor of breathing at this time. On-call [place] notified to request PRN Duoneb now- Respiratory aware. All other VSS, patient does not appear in distress and is resting in bed. Pt c/o sudden nausea- did dry heave and spit up a small amount of what appeared to be thick, white saliva. PRN Zofran ordered and given. Will continue to monitor closely.

## 2017-02-16 ENCOUNTER — Inpatient Hospital Stay (HOSPITAL_COMMUNITY): Payer: 59

## 2017-02-16 LAB — URINE CULTURE: CULTURE: NO GROWTH

## 2017-02-16 LAB — TSH: TSH: 4.368 u[IU]/mL (ref 0.350–4.500)

## 2017-02-16 LAB — CBC
HCT: 45.1 % (ref 36.0–46.0)
HEMOGLOBIN: 14.3 g/dL (ref 12.0–15.0)
MCH: 26.1 pg (ref 26.0–34.0)
MCHC: 31.7 g/dL (ref 30.0–36.0)
MCV: 82.4 fL (ref 78.0–100.0)
PLATELETS: 349 10*3/uL (ref 150–400)
RBC: 5.47 MIL/uL — AB (ref 3.87–5.11)
RDW: 13.4 % (ref 11.5–15.5)
WBC: 12.8 10*3/uL — AB (ref 4.0–10.5)

## 2017-02-16 LAB — BASIC METABOLIC PANEL
Anion gap: 6 (ref 5–15)
BUN: 9 mg/dL (ref 6–20)
CHLORIDE: 105 mmol/L (ref 101–111)
CO2: 25 mmol/L (ref 22–32)
Calcium: 8.2 mg/dL — ABNORMAL LOW (ref 8.9–10.3)
Creatinine, Ser: 0.66 mg/dL (ref 0.44–1.00)
Glucose, Bld: 162 mg/dL — ABNORMAL HIGH (ref 65–99)
POTASSIUM: 5.5 mmol/L — AB (ref 3.5–5.1)
SODIUM: 136 mmol/L (ref 135–145)

## 2017-02-16 LAB — GLUCOSE, CAPILLARY
GLUCOSE-CAPILLARY: 184 mg/dL — AB (ref 65–99)
Glucose-Capillary: 164 mg/dL — ABNORMAL HIGH (ref 65–99)
Glucose-Capillary: 107 mg/dL — ABNORMAL HIGH (ref 65–99)

## 2017-02-16 MED ORDER — METOPROLOL TARTRATE 50 MG PO TABS
50.0000 mg | ORAL_TABLET | Freq: Two times a day (BID) | ORAL | Status: DC
  Administered 2017-02-16 – 2017-02-18 (×5): 50 mg via ORAL
  Filled 2017-02-16 (×5): qty 1

## 2017-02-16 MED ORDER — MORPHINE SULFATE ER 30 MG PO TBCR
60.0000 mg | EXTENDED_RELEASE_TABLET | Freq: Two times a day (BID) | ORAL | Status: DC
  Administered 2017-02-16 – 2017-02-23 (×16): 60 mg via ORAL
  Filled 2017-02-16 (×16): qty 2

## 2017-02-16 MED ORDER — POLYETHYLENE GLYCOL 3350 17 G PO PACK
17.0000 g | PACK | Freq: Two times a day (BID) | ORAL | Status: DC
  Administered 2017-02-16 – 2017-02-21 (×10): 17 g via ORAL
  Filled 2017-02-16 (×12): qty 1

## 2017-02-16 MED ORDER — SODIUM POLYSTYRENE SULFONATE PO POWD
15.0000 g | Freq: Once | ORAL | Status: AC
  Administered 2017-02-16: 15 g via ORAL
  Filled 2017-02-16: qty 15

## 2017-02-16 MED ORDER — FUROSEMIDE 10 MG/ML IJ SOLN
20.0000 mg | Freq: Once | INTRAMUSCULAR | Status: AC
  Administered 2017-02-16: 20 mg via INTRAVENOUS
  Filled 2017-02-16: qty 2

## 2017-02-16 MED ORDER — BISACODYL 5 MG PO TBEC
10.0000 mg | DELAYED_RELEASE_TABLET | Freq: Every day | ORAL | Status: DC
Start: 2017-02-16 — End: 2017-02-23
  Administered 2017-02-16 – 2017-02-21 (×6): 10 mg via ORAL
  Filled 2017-02-16 (×6): qty 2

## 2017-02-16 MED ORDER — DOCUSATE SODIUM 100 MG PO CAPS
200.0000 mg | ORAL_CAPSULE | Freq: Two times a day (BID) | ORAL | Status: DC
  Administered 2017-02-16 – 2017-02-28 (×17): 200 mg via ORAL
  Filled 2017-02-16 (×19): qty 2

## 2017-02-16 NOTE — Progress Notes (Signed)
PROGRESS NOTE        PATIENT DETAILS Name: Jessica Gregory Age: 67 y.o. Sex: female Date of Birth: 02/15/1950 Admit Date: 02/01/2017 Admitting Physician Jessica Arsenio Loader, MD [id number], Jessica Solomons, MD  Brief Narrative: Patient is a 67 y.o. female with history of DM-2 who was referred to the emergency room by her primary care practitioner for evaluation of an abnormal CT scan.  Apparently, since this past November-patient has had worsening abdominal pain and distention, a CT scan of the abdomen done in the outpatient setting showed extensive lung, liver, peritoneal and omental metastases with large amount of abscess.  Primary tumor is currently not evident.  Also-for the past few days, patient has been having subjective fever, cough and other URI symptoms.  See below for further details.  Subjective:  Patient in bed, appears comfortable, denies any headache, no fever, no chest pain or pressure, no shortness of breath , continues to have mild nausea and generalized abdominal pain. No focal weakness.  Assessment/Plan:  Newly diagnosed extensive intra-abdominal and pulmonary metastases without obvious primary lesion: Status post diagnostic and therapeutic paracentesis on 02/14/2017, cytology results awaited, if inconclusive she will get CT-guided biopsy through IR for definitive diagnosis, continue supportive care, oncology consult once pathology results back.  Still has considerable abdominal pain, have listed on moderate dose MS Contin, adjusted pain medications, continue to monitor.  UTI: Clinically no sepsis on Rocephin follow cultures.  Hyponatremia.  Stop D5, gentle Lasix thereafter normal saline and monitor.    DM-2: CBGs relatively stable with SSI-continue to hold all oral hypoglycemic agents.  CBG (last 3)  Recent Labs    02/15/17 1240 02/15/17 1742 02/15/17 2117  GLUCAP 184* 144* 183*     DVT Prophylaxis: SCD's  Code Status: Full code   Family  Communication:  at bedside  Disposition Plan: Remain inpatient  Antimicrobial agents: Anti-infectives (From admission, onward)   Start     Dose/Rate Route Frequency Ordered Stop   02/15/17 1200  vancomycin (VANCOCIN) IVPB 1000 mg/200 mL premix  Status:  Discontinued     1,000 mg 200 mL/hr over 60 Minutes Intravenous Every 36 hours 02/14/17 0303 02/14/17 1026   02/14/17 1500  cefTRIAXone (ROCEPHIN) 1 g in dextrose 5 % 50 mL IVPB     1 g 100 mL/hr over 30 Minutes Intravenous Every 24 hours 02/14/17 1026     02/14/17 0600  piperacillin-tazobactam (ZOSYN) IVPB 3.375 g  Status:  Discontinued     3.375 g 12.5 mL/hr over 240 Minutes Intravenous Every 8 hours 02/14/17 0304 02/14/17 1026   02/05/2017 2330  piperacillin-tazobactam (ZOSYN) IVPB 3.375 g     3.375 g 100 mL/hr over 30 Minutes Intravenous  Once 02/19/2017 2317 02/14/17 0028   02/18/2017 2330  vancomycin (VANCOCIN) IVPB 1000 mg/200 mL premix     1,000 mg 200 mL/hr over 60 Minutes Intravenous  Once 02/26/2017 2317 02/14/17 0159      Procedures: None  CONSULTS:  IR  Time spent: 25- minutes-Greater than 50% of this time was spent in counseling, explanation of diagnosis, planning of further management, and coordination of care.  MEDICATIONS: Scheduled Meds: . bisacodyl  10 mg Oral Daily  . docusate sodium  200 mg Oral BID  . feeding supplement (GLUCERNA SHAKE)  237 mL Oral TID BM  . Influenza vac split quadrivalent PF  0.5 mL Intramuscular Tomorrow-1000  .  insulin aspart  0-5 Units Subcutaneous QHS  . insulin aspart  0-9 Units Subcutaneous TID WC  . ipratropium-albuterol  3 mL Nebulization BID  . metoprolol tartrate  50 mg Oral BID  . morphine  60 mg Oral Q12H  . polyethylene glycol  17 g Oral BID   Continuous Infusions: . cefTRIAXone (ROCEPHIN)  IV 1 g (02/15/17 1600)   PRN Meds:.acetaminophen **OR** acetaminophen, benzonatate, HYDROcodone-acetaminophen, morphine injection, ondansetron (ZOFRAN) IV,  senna-docusate   PHYSICAL EXAM: Vital signs: Vitals:   02/16/17 0500 02/16/17 0700 02/16/17 0858 02/16/17 1055  BP: (!) 156/94   (!) 146/90  Pulse: (!) 127   (!) 128  Resp: 18     Temp: 97.9 F (36.6 C)     TempSrc: Oral     SpO2: 91%  96%   Weight:  54.5 kg (120 lb 2.4 oz)    Height:       Filed Weights   02/14/17 0222 02/15/17 0550 02/16/17 0700  Weight: 55.2 kg (121 lb 11.1 oz) 53.1 kg (117 lb 1 oz) 54.5 kg (120 lb 2.4 oz)   Body mass index is 24.68 kg/m.   Exam  Awake Alert, Oriented X 3, No new F.N deficits, Normal affect [place].AT,PERRAL Supple Neck,No JVD, No cervical lymphadenopathy appriciated.  Symmetrical Chest wall movement, Good air movement bilaterally, few crackles RRR,No Gallops, Rubs or new Murmurs, No Parasternal Heave +ve B.Sounds, Abd mildly distended, No tenderness, No organomegaly appriciated, No rebound - guarding or rigidity. No Cyanosis, Clubbing or edema, No new Rash or bruise  I have personally reviewed following labs and imaging studies  LABORATORY DATA: CBC: Recent Labs  Lab 02/27/2017 1117 02/22/2017 1724 02/14/17 0223 02/15/17 0417 02/16/17 0337  WBC 13.6* 15.4* 16.9* 12.0* 12.8*  NEUTROABS 10.5*  --   --   --   --   HGB 14.8 14.4 12.6 13.7 14.3  HCT 46.1 44.4 39.4 42.5 45.1  MCV 81 81.8 81.7 80.8 82.4  PLT 322 323 253 301 623    Basic Metabolic Panel: Recent Labs  Lab 02/14/2017 1427 02/01/2017 1524 02/07/2017 1724 02/14/17 0223 02/15/17 0417 02/16/17 0337  NA 138  --  132* 132* 130* 136  K 4.1  --  3.8 3.5 3.4* 5.5*  CL 97  --  96* 102 100* 105  CO2 20  --  24 20* 23 25  GLUCOSE 170*  --  166* 191* 196* 162*  BUN 12  --  15 13 7 9   CREATININE 0.60 0.70 0.68 0.66 0.50 0.66  CALCIUM 8.9  --  8.4* 7.5* 7.5* 8.2*    GFR: Estimated Creatinine Clearance: 52.1 mL/min (by C-G formula based on SCr of 0.66 mg/dL).  Liver Function Tests: Recent Labs  Lab 02/06/2017 1048 02/26/2017 1427 02/06/2017 1724 02/14/17 0223  AST 23 26 25 23    ALT 23 26 25 19   ALKPHOS 105 102 90 71  BILITOT 0.4 0.4 0.7 1.3*  PROT 6.5 6.4 6.7 5.6*  ALBUMIN 3.1* 3.1* 2.6* 2.3*   Recent Labs  Lab 02/03/2017 1117 02/21/2017 1724  LIPASE 8* 14   No results for input(s): AMMONIA in the last 168 hours.  Coagulation Profile: Recent Labs  Lab 02/14/17 0223  INR 1.08    Cardiac Enzymes: No results for input(s): CKTOTAL, CKMB, CKMBINDEX, TROPONINI in the last 168 hours.  BNP (last 3 results) No results for input(s): PROBNP in the last 8760 hours.  HbA1C: No results for input(s): HGBA1C in the last 72 hours.  CBG: Recent  Labs  Lab 02/14/17 2141 02/15/17 0817 02/15/17 1240 02/15/17 1742 02/15/17 2117  GLUCAP 195* 201* 184* 144* 183*    Lipid Profile: No results for input(s): CHOL, HDL, LDLCALC, TRIG, CHOLHDL, LDLDIRECT in the last 72 hours.  Thyroid Function Tests: Recent Labs    02/16/17 0912  TSH 4.368    Anemia Panel: No results for input(s): VITAMINB12, FOLATE, FERRITIN, TIBC, IRON, RETICCTPCT in the last 72 hours.  Urine analysis:    Component Value Date/Time   COLORURINE YELLOW 02/21/2017 1848   APPEARANCEUR CLEAR 02/26/2017 1848   LABSPEC >1.046 (H) 02/07/2017 1848   PHURINE 6.0 02/02/2017 1848   GLUCOSEU NEGATIVE 02/16/2017 1848   HGBUR SMALL (A) 02/06/2017 1848   BILIRUBINUR NEGATIVE 01/29/2017 1848   BILIRUBINUR moderate (A) 02/03/2017 1127   KETONESUR 80 (A) 02/11/2017 1848   PROTEINUR NEGATIVE 02/07/2017 1848   UROBILINOGEN 1.0 02/22/2017 1127   NITRITE NEGATIVE 02/14/2017 1848   LEUKOCYTESUR MODERATE (A) 01/31/2017 1848    Sepsis Labs: Lactic Acid, Venous    Component Value Date/Time   LATICACIDVEN 1.78 02/18/2017 2110    MICROBIOLOGY: Recent Results (from the past 240 hour(s))  Blood Culture (routine x 2)     Status: None (Preliminary result)   Collection Time: 02/16/2017  8:28 PM  Result Value Ref Range Status   Specimen Description BLOOD RIGHT ANTECUBITAL  Final   Special Requests   Final     BOTTLES DRAWN AEROBIC AND ANAEROBIC Blood Culture adequate volume   Culture   Final    NO GROWTH 2 DAYS Performed at [facility], [place] [address] [place], [place] [address]    Report Status PENDING  Incomplete  Blood Culture (routine x 2)     Status: None (Preliminary result)   Collection Time: 02/11/2017  8:33 PM  Result Value Ref Range Status   Specimen Description BLOOD RIGHT WRIST  Final   Special Requests   Final    BOTTLES DRAWN AEROBIC AND ANAEROBIC Blood Culture adequate volume   Culture   Final    NO GROWTH 2 DAYS Performed at [facility], [place] [address], [place], [place] [address]    Report Status PENDING  Incomplete  Culture, body fluid-bottle     Status: None (Preliminary result)   Collection Time: 02/14/17  9:58 AM  Result Value Ref Range Status   Specimen Description PERITONEAL  Final   Special Requests NONE  Final   Culture   Final    NO GROWTH 1 DAY Performed at [facility], [place] [address] [place], [place] [address]    Report Status PENDING  Incomplete  Gram stain     Status: None   Collection Time: 02/14/17  9:58 AM  Result Value Ref Range Status   Specimen Description PERITONEAL  Final   Special Requests NONE  Final   Gram Stain   Final    CYTOSPIN SMEAR WBC PRESENT,BOTH PMN AND MONONUCLEAR NO ORGANISMS SEEN Performed at [facility], [place] [address] [place], [place] [address]    Report Status 02/14/2017 FINAL  Final  MRSA PCR Screening     Status: None   Collection Time: 02/14/17 11:55 AM  Result Value Ref Range Status   MRSA by PCR NEGATIVE NEGATIVE Final    Comment:        The GeneXpert MRSA Assay (FDA approved for NASAL specimens only), is one component of a comprehensive MRSA colonization surveillance program. It is not intended to diagnose MRSA  infection nor to guide or monitor treatment for MRSA infections.   Culture, Urine     Status: None   Collection Time: 02/14/17  2:00 PM  Result Value Ref Range Status    Specimen Description URINE, CLEAN CATCH  Final   Special Requests NONE  Final   Culture   Final    NO GROWTH Performed at [facility], [address] [place], [place] [address]    Report Status 02/16/2017 FINAL  Final    RADIOLOGY STUDIES/RESULTS: Dg Chest 2 View  Result Date: 02/17/2017 CLINICAL DATA:  67 y/o  F; fever and increasing shortness of breath. EXAM: CHEST  2 VIEW COMPARISON:  01/08/2013 chest radiograph FINDINGS: Stable normal cardiac silhouette given projection and technique. Diffuse patchy opacifications. No pleural effusion or pneumothorax. No acute osseous abnormality is evident. IMPRESSION: Diffuse patchy opacification of the lungs probably representing multifocal pneumonia. Electronically Signed   By: Kristine Garbe M.D.   On: 02/01/2017 22:26   Ct Angio Chest Pe W/cm &/or Wo Cm  Result Date: 02/03/2017 CLINICAL DATA:  Cancer cells and elevated white cells EXAM: CT ANGIOGRAPHY CHEST WITH CONTRAST TECHNIQUE: Multidetector CT imaging of the chest was performed using the standard protocol during bolus administration of intravenous contrast. Multiplanar CT image reconstructions and MIPs were obtained to evaluate the vascular anatomy. CONTRAST:  179mL ISOVUE-370 IOPAMIDOL (ISOVUE-370) INJECTION 76% COMPARISON:  02/23/2017 radiograph, CT abdomen pelvis 02/02/2017 FINDINGS: Cardiovascular: Satisfactory opacification of the pulmonary arteries to the segmental level. Respiratory motion artifact limits assessment for emboli particularly in the lower lobes and right middle lobe. No central filling defects are visualized. Nonaneurysmal aorta. Mild aortic atherosclerosis. Normal heart size. No pericardial effusion Mediastinum/Nodes: 9 mm lymph node adjacent to the left aortic arch. Midline trachea. No thyroid mass. Esophagus within normal limits Lungs/Pleura: Innumerable bilateral pulmonary nodules consistent with extensive metastatic disease. Mild bilateral septal  thickening with nodularity, possible lymphangitic tumor spread. No pleural effusion or pneumothorax. Upper Abdomen: Ascites in the upper abdomen with partially visualized hepatic mass lesion and multiple mesenteric/omental nodules in the left upper quadrant. Musculoskeletal: No acute or suspicious lesion Review of the MIP images confirms the above findings. IMPRESSION: 1. Respiratory motion artifact limits evaluation for emboli as discussed above. No definite central PE is seen 2. Extensive pulmonary nodules consistent with widespread metastatic disease. 3. Ascites in the upper abdomen with partially visible hepatic metastatic lesion and multiple metastatic foci in the left upper quadrant of the abdomen Aortic Atherosclerosis (ICD10-I70.0). Electronically Signed   By: Donavan Foil M.D.   On: 02/16/2017 23:49   Ct Abdomen Pelvis W Contrast  Result Date: 02/05/2017 CLINICAL DATA:  Left abdomen pain since November. Assess for diverticulitis. EXAM: CT ABDOMEN AND PELVIS WITH CONTRAST TECHNIQUE: Multidetector CT imaging of the abdomen and pelvis was performed using the standard protocol following bolus administration of intravenous contrast. CONTRAST:  143mL ISOVUE-300 IOPAMIDOL (ISOVUE-300) INJECTION 61% COMPARISON:  Ultrasound December 30, 2003 FINDINGS: Lower chest: Innumerable small nodules identified throughout the lung bases. The heart size is normal. Hepatobiliary: Several heterogeneous enhancing masses are identified within the liver, largest is in the right/left lobe liver measuring 7.4 x 4.6 cm. The largest mass is inseparable from the adjacent gallbladder/adjacent invasion is not excluded. The common bile duct is normal caliber at the head of pancreas. Pancreas: Unremarkable. No pancreatic ductal dilatation or surrounding inflammatory changes. Spleen: No acute abnormalities identified in the spleen. 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 Adrenals/Urinary Tract: The adrenal glands are  normal. There is a small simple cysts in the lateral mid to lower pole right kidney. No focal left kidney lesion is noted. There is no hydronephrosis bilaterally. The bladder is decompressed limiting evaluation. Stomach/Bowel: Multiple dilated small bowel loops are identified in the upper and mid abdomen. Colon is decompressed with mild diffuse bowel wall thickening probably secondary to surrounding ascites. Contrast is noted in the colon. Vascular/Lymphatic: Aortic atherosclerosis. No enlarged abdominal or pelvic lymph nodes. Reproductive: The uterus is unremarkable. The ovaries are not definitely seen. There is an extensive amount of ascites throughout the abdomen and pelvis. Innumerable omental and peritoneal heterogeneously enhancing masses are identified throughout the abdomen and pelvis. Other: Extensive ascites identified in the abdomen and pelvis. Musculoskeletal: No acute abnormality identified. IMPRESSION: Findings consistent with extensive lung, liver metastasis, peritoneal and omental metastasis with large amount of ascites in the abdomen and pelvis. Multiple dilated small bowel loops in the upper and mid abdomen with contrast noted in the colon, probably due to ileus. Electronically Signed   By: Abelardo Diesel M.D.   On: 02/01/2017 16:13   US Paracentesis  Result Date: 02/14/2017 INDICATION: Abdominal distention secondary to ascites. Abnormal imaging studies highly concerning for metastatic process. Request for diagnostic and therapeutic paracentesis. EXAM: ULTRASOUND GUIDED RIGHT LOWER QUADRANT PARACENTESIS MEDICATIONS: N None one. COMPLICATIONS: None immediate. PROCEDURE: Informed written consent was obtained from the patient after a discussion of the risks, benefits and alternatives to treatment. A timeout was performed prior to the initiation of the procedure. Initial ultrasound scanning demonstrates a large amount of ascites within the right lower abdominal quadrant. The right lower abdomen was  prepped and draped in the usual sterile fashion. 1% lidocaine with epinephrine was used for local anesthesia. Following this, a 19 gauge, 7-cm, Yueh catheter was introduced. An ultrasound image was saved for documentation purposes. The paracentesis was performed. The catheter was removed and a dressing was applied. The patient tolerated the procedure well without immediate post procedural complication. FINDINGS: A total of approximately 2.5 L of slightly hazy, yellow fluid was removed. Samples were sent to the laboratory as requested by the clinical team. IMPRESSION: Successful ultrasound-guided paracentesis yielding 2.5 liters of peritoneal fluid. Read by: Ascencion Dike PA-C Electronically Signed   By: Markus Daft M.D.   On: 02/14/2017 11:20   Dg Abd Portable 1v  Result Date: 02/16/2017 CLINICAL DATA:  67 year old female with history of nausea, vomiting and abdominal pain with worsening shortness of breath. EXAM: PORTABLE ABDOMEN - 1 VIEW COMPARISON:  No prior abdominal radiograph. CT the abdomen and pelvis 02/20/2017. FINDINGS: Gas and stool are noted throughout the colon. No pathologic dilatation of small bowel or colon. Several nondilated gas-filled loops of small bowel are noted (nonspecific). No definite pneumoperitoneum noted on this single supine image. Retained contrast material is evident in the cecum. Faint calcifications projecting over the right upper quadrant corresponding to calcifications in known hepatic mass. IMPRESSION: 1. Nonobstructive bowel gas pattern. 2. No pneumoperitoneum. Electronically Signed   By: Vinnie Langton M.D.   On: 02/16/2017 09:01     LOS: 2 days   Signature  Lala Lund M.D on 02/16/2017 at 11:49 AM  Between 7am to 7pm - Pager - [phone] ( page via [email or web address], text pages only, please mention full 10 digit call back number).  After 7pm go to [email or web address] - password [email or web address]

## 2017-02-16 NOTE — Progress Notes (Signed)
Pt HR sustaining 128-130. Resp 24. Labored breathing. MD notified. 12 lead EKG done. Will continue to monitor

## 2017-02-17 LAB — CBC
HCT: 43.5 % (ref 36.0–46.0)
Hemoglobin: 14.3 g/dL (ref 12.0–15.0)
MCH: 26.8 pg (ref 26.0–34.0)
MCHC: 32.9 g/dL (ref 30.0–36.0)
MCV: 81.6 fL (ref 78.0–100.0)
PLATELETS: 406 10*3/uL — AB (ref 150–400)
RBC: 5.33 MIL/uL — AB (ref 3.87–5.11)
RDW: 13.8 % (ref 11.5–15.5)
WBC: 17.2 10*3/uL — AB (ref 4.0–10.5)

## 2017-02-17 LAB — BASIC METABOLIC PANEL
ANION GAP: 11 (ref 5–15)
BUN: 17 mg/dL (ref 6–20)
CO2: 21 mmol/L — ABNORMAL LOW (ref 22–32)
Calcium: 8.4 mg/dL — ABNORMAL LOW (ref 8.9–10.3)
Chloride: 101 mmol/L (ref 101–111)
Creatinine, Ser: 0.83 mg/dL (ref 0.44–1.00)
GFR calc Af Amer: >60 mL/min (ref >60)
GLUCOSE: 128 mg/dL — AB (ref 65–99)
POTASSIUM: 5.5 mmol/L — AB (ref 3.5–5.1)
Sodium: 133 mmol/L — ABNORMAL LOW (ref 135–145)

## 2017-02-17 LAB — GLUCOSE, CAPILLARY
GLUCOSE-CAPILLARY: 136 mg/dL — AB (ref 65–99)
GLUCOSE-CAPILLARY: 167 mg/dL — AB (ref 65–99)
Glucose-Capillary: 183 mg/dL — ABNORMAL HIGH (ref 65–99)
Glucose-Capillary: 180 mg/dL — ABNORMAL HIGH (ref 65–99)

## 2017-02-17 MED ORDER — SODIUM POLYSTYRENE SULFONATE PO POWD
30.0000 g | Freq: Once | ORAL | Status: AC
  Administered 2017-02-17: 30 g via ORAL
  Filled 2017-02-17: qty 30

## 2017-02-17 MED ORDER — SODIUM POLYSTYRENE SULFONATE 15 GM/60ML PO SUSP: 30.0000 g | Freq: Once | ORAL | Status: DC

## 2017-02-17 MED ORDER — FUROSEMIDE 10 MG/ML IJ SOLN
20.0000 mg | Freq: Once | INTRAMUSCULAR | Status: AC
  Administered 2017-02-17: 20 mg via INTRAVENOUS
  Filled 2017-02-17: qty 2

## 2017-02-17 MED ORDER — MAGNESIUM HYDROXIDE 400 MG/5ML PO SUSP
30.0000 mL | Freq: Two times a day (BID) | ORAL | Status: AC
  Administered 2017-02-17 (×2): 30 mL via ORAL
  Filled 2017-02-17 (×2): qty 30

## 2017-02-17 NOTE — Progress Notes (Signed)
PROGRESS NOTE        PATIENT DETAILS Name: Jessica Gregory Age: 67 y.o. Sex: female Date of Birth: 03-20-1950 Admit Date: 02/24/2017 Admitting Physician Ankit Arsenio Loader, MD MVE:[id number], Arlie Solomons, MD  Brief Narrative:  Patient is a 67 y.o. female with history of DM-2 who was referred to the emergency room by her primary care practitioner for evaluation of an abnormal CT scan.  Apparently, since this past November-patient has had worsening abdominal pain and distention, a CT scan of the abdomen done in the outpatient setting showed extensive lung, liver, peritoneal and omental metastases with large amount of abscess.  Primary tumor is currently not evident.  Also-for the past few days, patient has been having subjective fever, cough and other URI symptoms.  See below for further details.  Subjective:  Patient in bed, appears comfortable, denies any headache, no fever, no chest pain or pressure, no shortness of breath  and generalized abdominal pain but nausea better today no focal weakness.  Assessment/Plan:  Newly diagnosed extensive intra-abdominal and pulmonary metastases without obvious primary lesion: Status post diagnostic and therapeutic paracentesis on 02/14/2017, cytology results awaited talked with the pathology department on 02/17/2017 and gave my cell phone number, they will call him with the results as soon as pathologist has read the slides, if inconclusive she will get CT-guided biopsy through IR for definitive diagnosis, continue supportive care, oncology consult once pathology results back.  Still has considerable abdominal pain, have listed on moderate dose MS Contin, adjusted pain medications, continue to monitor.  UTI: Clinically no sepsis on Rocephin follow cultures.  Hyponatremia.  Stopped D5, gentle Lasix thereafter normal saline and monitor.    DM-2: CBGs relatively stable with SSI-continue to hold all oral hypoglycemic agents.  CBG (last 3)    Recent Labs    02/16/17 1640 02/16/17 2045 02/17/17 0454  GLUCAP 164* 107* 136*     DVT Prophylaxis: SCD's  Code Status: Full code   Family Communication:  at bedside  Disposition Plan: Remain inpatient  Antimicrobial agents: Anti-infectives (From admission, onward)   Start     Dose/Rate Route Frequency Ordered Stop   02/15/17 1200  vancomycin (VANCOCIN) IVPB 1000 mg/200 mL premix  Status:  Discontinued     1,000 mg 200 mL/hr over 60 Minutes Intravenous Every 36 hours 02/14/17 0303 02/14/17 1026   02/14/17 1500  cefTRIAXone (ROCEPHIN) 1 g in dextrose 5 % 50 mL IVPB     1 g 100 mL/hr over 30 Minutes Intravenous Every 24 hours 02/14/17 1026     02/14/17 0600  piperacillin-tazobactam (ZOSYN) IVPB 3.375 g  Status:  Discontinued     3.375 g 12.5 mL/hr over 240 Minutes Intravenous Every 8 hours 02/14/17 0304 02/14/17 1026   02/08/2017 2330  piperacillin-tazobactam (ZOSYN) IVPB 3.375 g     3.375 g 100 mL/hr over 30 Minutes Intravenous  Once 02/19/2017 2317 02/14/17 0028   02/12/2017 2330  vancomycin (VANCOCIN) IVPB 1000 mg/200 mL premix     1,000 mg 200 mL/hr over 60 Minutes Intravenous  Once 02/22/2017 2317 02/14/17 0159      Procedures: None  CONSULTS:  IR  Time spent: 25- minutes-Greater than 50% of this time was spent in counseling, explanation of diagnosis, planning of further management, and coordination of care.  MEDICATIONS: Scheduled Meds: . bisacodyl  10 mg Oral Daily  . docusate sodium  200 mg Oral BID  . feeding supplement (GLUCERNA SHAKE)  237 mL Oral TID BM  . insulin aspart  0-5 Units Subcutaneous QHS  . insulin aspart  0-9 Units Subcutaneous TID WC  . ipratropium-albuterol  3 mL Nebulization BID  . magnesium hydroxide  30 mL Oral BID  . metoprolol tartrate  50 mg Oral BID  . morphine  60 mg Oral Q12H  . polyethylene glycol  17 g Oral BID   Continuous Infusions: . cefTRIAXone (ROCEPHIN)  IV 1 g (02/16/17 1552)   PRN Meds:.acetaminophen **OR**  [DISCONTINUED] acetaminophen, benzonatate, HYDROcodone-acetaminophen, morphine injection, ondansetron (ZOFRAN) IV, senna-docusate   PHYSICAL EXAM: Vital signs: Vitals:   02/16/17 2149 02/17/17 0624 02/17/17 0740 02/17/17 0743  BP:  (!) 153/88    Pulse:  (!) 120    Resp:  20    Temp:  98.2 F (36.8 C)    TempSrc:  Oral    SpO2: 92% 95% 98% 98%  Weight:      Height:       Filed Weights   02/14/17 0222 02/15/17 0550 02/16/17 0700  Weight: 55.2 kg (121 lb 11.1 oz) 53.1 kg (117 lb 1 oz) 54.5 kg (120 lb 2.4 oz)   Body mass index is 24.68 kg/m.   Exam  Awake Alert, Oriented X 3, No new F.N deficits, Normal affect [place].AT,PERRAL Supple Neck,No JVD, No cervical lymphadenopathy appriciated.  Symmetrical Chest wall movement, Good air movement bilaterally, CTAB RRR,No Gallops, Rubs or new Murmurs, No Parasternal Heave +ve B.Sounds, Abd distended with mild generalized tenderness, No organomegaly appriciated, No rebound - guarding or rigidity. No Cyanosis, Clubbing or edema, No new Rash or bruise  I have personally reviewed following labs and imaging studies  LABORATORY DATA:  CBC: Recent Labs  Lab 02/26/2017 1117 02/25/2017 1724 02/14/17 0223 02/15/17 0417 02/16/17 0337 02/17/17 0448  WBC 13.6* 15.4* 16.9* 12.0* 12.8* 17.2*  NEUTROABS 10.5*  --   --   --   --   --   HGB 14.8 14.4 12.6 13.7 14.3 14.3  HCT 46.1 44.4 39.4 42.5 45.1 43.5  MCV 81 81.8 81.7 80.8 82.4 81.6  PLT 322 323 253 301 349 406*    Basic Metabolic Panel: Recent Labs  Lab 02/23/2017 1724 02/14/17 0223 02/15/17 0417 02/16/17 0337 02/17/17 0448  NA 132* 132* 130* 136 133*  K 3.8 3.5 3.4* 5.5* 5.5*  CL 96* 102 100* 105 101  CO2 24 20* 23 25 21*  GLUCOSE 166* 191* 196* 162* 128*  BUN 15 13 7 9 17   CREATININE 0.68 0.66 0.50 0.66 0.83  CALCIUM 8.4* 7.5* 7.5* 8.2* 8.4*    GFR: Estimated Creatinine Clearance: 50.2 mL/min (by C-G formula based on SCr of 0.83 mg/dL).  Liver Function Tests: Recent Labs    Lab 01/30/2017 1048 02/08/2017 1427 02/22/2017 1724 02/14/17 0223  AST 23 26 25 23   ALT 23 26 25 19   ALKPHOS 105 102 90 71  BILITOT 0.4 0.4 0.7 1.3*  PROT 6.5 6.4 6.7 5.6*  ALBUMIN 3.1* 3.1* 2.6* 2.3*   Recent Labs  Lab 02/10/2017 1117 02/12/2017 1724  LIPASE 8* 14   No results for input(s): AMMONIA in the last 168 hours.  Coagulation Profile: Recent Labs  Lab 02/14/17 0223  INR 1.08    Cardiac Enzymes: No results for input(s): CKTOTAL, CKMB, CKMBINDEX, TROPONINI in the last 168 hours.  BNP (last 3 results) No results for input(s): PROBNP in the last 8760 hours.  HbA1C: No results for input(s):  HGBA1C in the last 72 hours.  CBG: Recent Labs  Lab 02/15/17 2117 02/16/17 1149 02/16/17 1640 02/16/17 2045 02/17/17 0454  GLUCAP 183* 184* 164* 107* 136*    Lipid Profile: No results for input(s): CHOL, HDL, LDLCALC, TRIG, CHOLHDL, LDLDIRECT in the last 72 hours.  Thyroid Function Tests: Recent Labs    02/16/17 0912  TSH 4.368    Anemia Panel: No results for input(s): VITAMINB12, FOLATE, FERRITIN, TIBC, IRON, RETICCTPCT in the last 72 hours.  Urine analysis:    Component Value Date/Time   COLORURINE YELLOW 01/30/2017 1848   APPEARANCEUR CLEAR 02/07/2017 1848   LABSPEC >1.046 (H) 02/22/2017 1848   PHURINE 6.0 02/18/2017 1848   GLUCOSEU NEGATIVE 02/23/2017 1848   HGBUR SMALL (A) 01/29/2017 1848   BILIRUBINUR NEGATIVE 02/14/2017 1848   BILIRUBINUR moderate (A) 02/04/2017 1127   KETONESUR 80 (A) 02/22/2017 1848   PROTEINUR NEGATIVE 02/08/2017 1848   UROBILINOGEN 1.0 02/01/2017 1127   NITRITE NEGATIVE 02/05/2017 1848   LEUKOCYTESUR MODERATE (A) 02/22/2017 1848    Sepsis Labs: Lactic Acid, Venous    Component Value Date/Time   LATICACIDVEN 1.78 01/29/2017 2110    MICROBIOLOGY: Recent Results (from the past 240 hour(s))  Blood Culture (routine x 2)     Status: None (Preliminary result)   Collection Time: 02/10/2017  8:28 PM  Result Value Ref Range Status    Specimen Description BLOOD RIGHT ANTECUBITAL  Final   Special Requests   Final    BOTTLES DRAWN AEROBIC AND ANAEROBIC Blood Culture adequate volume   Culture   Final    NO GROWTH 3 DAYS Performed at [facility], [place] [address], [place], [place] [address]    Report Status PENDING  Incomplete  Blood Culture (routine x 2)     Status: None (Preliminary result)   Collection Time: 02/01/2017  8:33 PM  Result Value Ref Range Status   Specimen Description BLOOD RIGHT WRIST  Final   Special Requests   Final    BOTTLES DRAWN AEROBIC AND ANAEROBIC Blood Culture adequate volume   Culture   Final    NO GROWTH 3 DAYS Performed at [facility], [address], [place], [place] [address]    Report Status PENDING  Incomplete  Culture, body fluid-bottle     Status: None (Preliminary result)   Collection Time: 02/14/17  9:58 AM  Result Value Ref Range Status   Specimen Description PERITONEAL  Final   Special Requests NONE  Final   Culture   Final    NO GROWTH 2 DAYS Performed at [facility] [address] [place], [place] [address]    Report Status PENDING  Incomplete  Gram stain     Status: None   Collection Time: 02/14/17  9:58 AM  Result Value Ref Range Status   Specimen Description PERITONEAL  Final   Special Requests NONE  Final   Gram Stain   Final    CYTOSPIN SMEAR WBC PRESENT,BOTH PMN AND MONONUCLEAR NO ORGANISMS SEEN Performed at [facility], [place] [address], [place], [place] [address]    Report Status 02/14/2017 FINAL  Final  MRSA PCR Screening     Status: None   Collection Time: 02/14/17 11:55 AM  Result Value Ref Range Status   MRSA by PCR NEGATIVE NEGATIVE Final    Comment:        The GeneXpert MRSA Assay (FDA approved for NASAL specimens only), is one component of a comprehensive MRSA colonization  surveillance program. It is not intended to diagnose MRSA infection nor to guide or monitor treatment for MRSA infections.   Culture, Urine      Status: None   Collection Time: 02/14/17  2:00 PM  Result Value Ref Range Status   Specimen Description URINE, CLEAN CATCH  Final   Special Requests NONE  Final   Culture   Final    NO GROWTH Performed at [facility], [address] [place], [place] [address]    Report Status 02/16/2017 FINAL  Final    RADIOLOGY STUDIES/RESULTS: Dg Chest 2 View  Result Date: 02/12/2017 CLINICAL DATA:  67 y/o  F; fever and increasing shortness of breath. EXAM: CHEST  2 VIEW COMPARISON:  01/08/2013 chest radiograph FINDINGS: Stable normal cardiac silhouette given projection and technique. Diffuse patchy opacifications. No pleural effusion or pneumothorax. No acute osseous abnormality is evident. IMPRESSION: Diffuse patchy opacification of the lungs probably representing multifocal pneumonia. Electronically Signed   By: Kristine Garbe M.D.   On: 01/28/2017 22:26   Ct Angio Chest Pe W/cm &/or Wo Cm  Result Date: 02/05/2017 CLINICAL DATA:  Cancer cells and elevated white cells EXAM: CT ANGIOGRAPHY CHEST WITH CONTRAST TECHNIQUE: Multidetector CT imaging of the chest was performed using the standard protocol during bolus administration of intravenous contrast. Multiplanar CT image reconstructions and MIPs were obtained to evaluate the vascular anatomy. CONTRAST:  189mL ISOVUE-370 IOPAMIDOL (ISOVUE-370) INJECTION 76% COMPARISON:  02/21/2017 radiograph, CT abdomen pelvis 02/10/2017 FINDINGS: Cardiovascular: Satisfactory opacification of the pulmonary arteries to the segmental level. Respiratory motion artifact limits assessment for emboli particularly in the lower lobes and right middle lobe. No central filling defects are visualized. Nonaneurysmal aorta. Mild aortic atherosclerosis. Normal heart size. No pericardial effusion Mediastinum/Nodes: 9 mm lymph node adjacent to the left aortic arch. Midline trachea. No thyroid mass. Esophagus within normal limits Lungs/Pleura: Innumerable bilateral pulmonary  nodules consistent with extensive metastatic disease. Mild bilateral septal thickening with nodularity, possible lymphangitic tumor spread. No pleural effusion or pneumothorax. Upper Abdomen: Ascites in the upper abdomen with partially visualized hepatic mass lesion and multiple mesenteric/omental nodules in the left upper quadrant. Musculoskeletal: No acute or suspicious lesion Review of the MIP images confirms the above findings. IMPRESSION: 1. Respiratory motion artifact limits evaluation for emboli as discussed above. No definite central PE is seen 2. Extensive pulmonary nodules consistent with widespread metastatic disease. 3. Ascites in the upper abdomen with partially visible hepatic metastatic lesion and multiple metastatic foci in the left upper quadrant of the abdomen Aortic Atherosclerosis (ICD10-I70.0). Electronically Signed   By: Donavan Foil M.D.   On: 01/29/2017 23:49   Ct Abdomen Pelvis W Contrast  Result Date: 02/25/2017 CLINICAL DATA:  Left abdomen pain since November. Assess for diverticulitis. EXAM: CT ABDOMEN AND PELVIS WITH CONTRAST TECHNIQUE: Multidetector CT imaging of the abdomen and pelvis was performed using the standard protocol following bolus administration of intravenous contrast. CONTRAST:  162mL ISOVUE-300 IOPAMIDOL (ISOVUE-300) INJECTION 61% COMPARISON:  Ultrasound December 30, 2003 FINDINGS: Lower chest: Innumerable small nodules identified throughout the lung bases. The heart size is normal. Hepatobiliary: Several heterogeneous enhancing masses are identified within the liver, largest is in the right/left lobe liver measuring 7.4 x 4.6 cm. The largest mass is inseparable from the adjacent gallbladder/adjacent invasion is not excluded. The common bile duct is normal caliber at the head of pancreas. Pancreas: Unremarkable. No pancreatic ductal dilatation or surrounding inflammatory changes. Spleen: No acute abnormalities identified in the spleen. 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 Adrenals/Urinary Tract: The adrenal glands are normal. There is a small simple cysts in the lateral mid to lower pole right kidney. No focal left kidney lesion is noted. There is no hydronephrosis bilaterally. The bladder is decompressed limiting evaluation. Stomach/Bowel: Multiple dilated small bowel loops are identified in the upper and mid abdomen. Colon is decompressed with mild diffuse bowel wall thickening probably secondary to surrounding ascites. Contrast is noted in the colon. Vascular/Lymphatic: Aortic atherosclerosis. No enlarged abdominal or pelvic lymph nodes. Reproductive: The uterus is unremarkable. The ovaries are not definitely seen. There is an extensive amount of ascites throughout the abdomen and pelvis. Innumerable omental and peritoneal heterogeneously enhancing masses are identified throughout the abdomen and pelvis. Other: Extensive ascites identified in the abdomen and pelvis. Musculoskeletal: No acute abnormality identified. IMPRESSION: Findings consistent with extensive lung, liver metastasis, peritoneal and omental metastasis with large amount of ascites in the abdomen and pelvis. Multiple dilated small bowel loops in the upper and mid abdomen with contrast noted in the colon, probably due to ileus. Electronically Signed   By: Abelardo Diesel M.D.   On: 02/08/2017 16:13   US Paracentesis  Result Date: 02/14/2017 INDICATION: Abdominal distention secondary to ascites. Abnormal imaging studies highly concerning for metastatic process. Request for diagnostic and therapeutic paracentesis. EXAM: ULTRASOUND GUIDED RIGHT LOWER QUADRANT PARACENTESIS MEDICATIONS: N None one. COMPLICATIONS: None immediate. PROCEDURE: Informed written consent was obtained from the patient after a discussion of the risks, benefits and alternatives to treatment. A timeout was performed prior to the initiation of the procedure. Initial ultrasound scanning demonstrates a large amount of  ascites within the right lower abdominal quadrant. The right lower abdomen was prepped and draped in the usual sterile fashion. 1% lidocaine with epinephrine was used for local anesthesia. Following this, a 19 gauge, 7-cm, Yueh catheter was introduced. An ultrasound image was saved for documentation purposes. The paracentesis was performed. The catheter was removed and a dressing was applied. The patient tolerated the procedure well without immediate post procedural complication. FINDINGS: A total of approximately 2.5 L of slightly hazy, yellow fluid was removed. Samples were sent to the laboratory as requested by the clinical team. IMPRESSION: Successful ultrasound-guided paracentesis yielding 2.5 liters of peritoneal fluid. Read by: Ascencion Dike PA-C Electronically Signed   By: Markus Daft M.D.   On: 02/14/2017 11:20   Dg Abd Portable 1v  Result Date: 02/16/2017 CLINICAL DATA:  67 year old female with history of nausea, vomiting and abdominal pain with worsening shortness of breath. EXAM: PORTABLE ABDOMEN - 1 VIEW COMPARISON:  No prior abdominal radiograph. CT the abdomen and pelvis 02/25/2017. FINDINGS: Gas and stool are noted throughout the colon. No pathologic dilatation of small bowel or colon. Several nondilated gas-filled loops of small bowel are noted (nonspecific). No definite pneumoperitoneum noted on this single supine image. Retained contrast material is evident in the cecum. Faint calcifications projecting over the right upper quadrant corresponding to calcifications in known hepatic mass. IMPRESSION: 1. Nonobstructive bowel gas pattern. 2. No pneumoperitoneum. Electronically Signed   By: Vinnie Langton M.D.   On: 02/16/2017 09:01     LOS: 3 days   Signature  Lala Lund M.D on 02/17/2017 at 11:54 AM  Between 7am to 7pm - Pager - [phone] ( page via [email or web address], text pages only, please mention full 10 digit call back number).  After 7pm go to [email or web address] - password [email or web address]

## 2017-02-17 NOTE — Telephone Encounter (Signed)
I didn't see an antibiotic in your notes, however he was given antibiotics in the hospital. Pls advise

## 2017-02-18 ENCOUNTER — Inpatient Hospital Stay (HOSPITAL_COMMUNITY): Payer: 59

## 2017-02-18 DIAGNOSIS — C787 Secondary malignant neoplasm of liver and intrahepatic bile duct: Secondary | ICD-10-CM

## 2017-02-18 DIAGNOSIS — R180 Malignant ascites: Secondary | ICD-10-CM

## 2017-02-18 DIAGNOSIS — C801 Malignant (primary) neoplasm, unspecified: Secondary | ICD-10-CM

## 2017-02-18 DIAGNOSIS — C786 Secondary malignant neoplasm of retroperitoneum and peritoneum: Principal | ICD-10-CM

## 2017-02-18 DIAGNOSIS — C7800 Secondary malignant neoplasm of unspecified lung: Secondary | ICD-10-CM

## 2017-02-18 LAB — CULTURE, BLOOD (ROUTINE X 2)
Culture: NO GROWTH
Culture: NO GROWTH
Special Requests: ADEQUATE
Special Requests: ADEQUATE

## 2017-02-18 LAB — CBC
HCT: 40.5 % (ref 36.0–46.0)
HEMOGLOBIN: 12.7 g/dL (ref 12.0–15.0)
MCH: 25.7 pg — AB (ref 26.0–34.0)
MCHC: 31.4 g/dL (ref 30.0–36.0)
MCV: 82 fL (ref 78.0–100.0)
PLATELETS: 442 10*3/uL — AB (ref 150–400)
RBC: 4.94 MIL/uL (ref 3.87–5.11)
RDW: 13.8 % (ref 11.5–15.5)
WBC: 15.3 10*3/uL — AB (ref 4.0–10.5)

## 2017-02-18 LAB — COMPREHENSIVE METABOLIC PANEL
ALBUMIN: 2.3 g/dL — AB (ref 3.5–5.0)
ALT: 20 U/L (ref 14–54)
ANION GAP: 10 (ref 5–15)
AST: 26 U/L (ref 15–41)
Alkaline Phosphatase: 77 U/L (ref 38–126)
BILIRUBIN TOTAL: 0.4 mg/dL (ref 0.3–1.2)
BUN: 30 mg/dL — ABNORMAL HIGH (ref 6–20)
CO2: 26 mmol/L (ref 22–32)
Calcium: 8.3 mg/dL — ABNORMAL LOW (ref 8.9–10.3)
Chloride: 94 mmol/L — ABNORMAL LOW (ref 101–111)
Creatinine, Ser: 0.99 mg/dL (ref 0.44–1.00)
GFR, EST NON AFRICAN AMERICAN: 59 mL/min — AB (ref >60)
Glucose, Bld: 201 mg/dL — ABNORMAL HIGH (ref 65–99)
POTASSIUM: 5.2 mmol/L — AB (ref 3.5–5.1)
SODIUM: 130 mmol/L — AB (ref 135–145)
TOTAL PROTEIN: 6.2 g/dL — AB (ref 6.5–8.1)

## 2017-02-18 LAB — GLUCOSE, CAPILLARY
GLUCOSE-CAPILLARY: 218 mg/dL — AB (ref 65–99)
GLUCOSE-CAPILLARY: 191 mg/dL — AB (ref 65–99)
Glucose-Capillary: 157 mg/dL — ABNORMAL HIGH (ref 65–99)
Glucose-Capillary: 173 mg/dL — ABNORMAL HIGH (ref 65–99)
Glucose-Capillary: 200 mg/dL — ABNORMAL HIGH (ref 65–99)

## 2017-02-18 LAB — PROTIME-INR
INR: 0.97
PROTHROMBIN TIME: 12.8 s (ref 11.4–15.2)

## 2017-02-18 LAB — SODIUM, URINE, RANDOM: Sodium, Ur: <10 mmol/L

## 2017-02-18 MED ORDER — SODIUM POLYSTYRENE SULFONATE PO POWD
15.0000 g | Freq: Once | ORAL | Status: AC
  Administered 2017-02-18: 15 g via ORAL
  Filled 2017-02-18: qty 15

## 2017-02-18 MED ORDER — HYDRALAZINE HCL 20 MG/ML IJ SOLN
10.0000 mg | Freq: Four times a day (QID) | INTRAMUSCULAR | Status: DC | PRN
  Administered 2017-02-18 – 2017-02-20 (×2): 10 mg via INTRAVENOUS
  Filled 2017-02-18 (×2): qty 1

## 2017-02-18 MED ORDER — GUAIFENESIN ER 600 MG PO TB12
600.0000 mg | ORAL_TABLET | Freq: Two times a day (BID) | ORAL | Status: DC
  Administered 2017-02-18 – 2017-02-22 (×8): 600 mg via ORAL
  Filled 2017-02-18 (×9): qty 1

## 2017-02-18 MED ORDER — MORPHINE SULFATE (PF) 4 MG/ML IV SOLN
2.0000 mg | INTRAVENOUS | Status: DC | PRN
  Administered 2017-02-18: 2 mg via INTRAVENOUS
  Filled 2017-02-18: qty 1

## 2017-02-18 MED ORDER — MORPHINE SULFATE (PF) 4 MG/ML IV SOLN
2.0000 mg | INTRAVENOUS | Status: AC | PRN
  Administered 2017-02-18 – 2017-02-20 (×5): 2 mg via INTRAVENOUS
  Filled 2017-02-18 (×5): qty 1

## 2017-02-18 MED ORDER — INSULIN ASPART 100 UNIT/ML ~~LOC~~ SOLN
0.0000 [IU] | Freq: Four times a day (QID) | SUBCUTANEOUS | Status: DC
  Administered 2017-02-18: 2 [IU] via SUBCUTANEOUS

## 2017-02-18 MED ORDER — ALBUTEROL SULFATE (2.5 MG/3ML) 0.083% IN NEBU
2.5000 mg | INHALATION_SOLUTION | RESPIRATORY_TRACT | Status: AC
  Administered 2017-02-18: 2.5 mg via RESPIRATORY_TRACT
  Filled 2017-02-18: qty 3

## 2017-02-18 MED ORDER — MAGNESIUM HYDROXIDE 400 MG/5ML PO SUSP
30.0000 mL | Freq: Two times a day (BID) | ORAL | Status: AC
Start: 2017-02-18 — End: 2017-02-18
  Administered 2017-02-18 (×2): 30 mL via ORAL
  Filled 2017-02-18: qty 30

## 2017-02-18 MED ORDER — SODIUM CHLORIDE 0.9 % IV SOLN
INTRAVENOUS | Status: DC
  Administered 2017-02-18 (×2): via INTRAVENOUS

## 2017-02-18 MED ORDER — VANCOMYCIN HCL 500 MG IV SOLR: 500.0000 mg | INTRAVENOUS | Status: DC

## 2017-02-18 MED ORDER — SODIUM CHLORIDE 0.9 % IV SOLN
1250.0000 mg | Freq: Once | INTRAVENOUS | Status: AC
Start: 2017-02-18 — End: 2017-02-19
  Administered 2017-02-18: 1250 mg via INTRAVENOUS
  Filled 2017-02-18: qty 1250

## 2017-02-18 MED ORDER — MORPHINE SULFATE (PF) 4 MG/ML IV SOLN
2.0000 mg | INTRAVENOUS | Status: AC
  Administered 2017-02-18: 2 mg via INTRAVENOUS
  Filled 2017-02-18: qty 1

## 2017-02-18 MED ORDER — METOPROLOL TARTRATE 25 MG PO TABS
100.0000 mg | ORAL_TABLET | Freq: Two times a day (BID) | ORAL | Status: DC
  Administered 2017-02-18 – 2017-02-27 (×16): 100 mg via ORAL
  Administered 2017-02-28: 50 mg via ORAL
  Administered 2017-02-28 – 2017-03-01 (×2): 100 mg via ORAL
  Administered 2017-03-01: 50 mg via ORAL
  Filled 2017-02-18 (×23): qty 2

## 2017-02-18 MED ORDER — PIPERACILLIN-TAZOBACTAM 3.375 G IVPB
3.3750 g | Freq: Three times a day (TID) | INTRAVENOUS | Status: AC
  Administered 2017-02-18 – 2017-02-20 (×5): 3.375 g via INTRAVENOUS
  Filled 2017-02-18 (×5): qty 50

## 2017-02-18 MED ORDER — MORPHINE SULFATE (PF) 4 MG/ML IV SOLN: 2.0000 mg | INTRAVENOUS | Status: DC | PRN

## 2017-02-18 MED ORDER — INSULIN ASPART 100 UNIT/ML ~~LOC~~ SOLN
0.0000 [IU] | Freq: Four times a day (QID) | SUBCUTANEOUS | Status: DC
  Administered 2017-02-18 – 2017-02-19 (×3): 2 [IU] via SUBCUTANEOUS
  Administered 2017-02-19: 3 [IU] via SUBCUTANEOUS
  Administered 2017-02-19: 5 [IU] via SUBCUTANEOUS
  Administered 2017-02-20: 1 [IU] via SUBCUTANEOUS
  Administered 2017-02-20: 3 [IU] via SUBCUTANEOUS
  Administered 2017-02-20: 1 [IU] via SUBCUTANEOUS

## 2017-02-18 MED ORDER — DEXAMETHASONE SODIUM PHOSPHATE 10 MG/ML IJ SOLN
10.0000 mg | Freq: Once | INTRAMUSCULAR | Status: AC
  Administered 2017-02-18: 10 mg via INTRAVENOUS
  Filled 2017-02-18: qty 1

## 2017-02-18 NOTE — Progress Notes (Signed)
PROGRESS NOTE        PATIENT DETAILS Name: Jessica Gregory Age: 67 y.o. Sex: female Date of Birth: 25-Sep-1950 Admit Date: 02/11/2017 Admitting Physician Ankit Arsenio Loader, MD CZY:[id number], Arlie Solomons, MD  Brief Narrative:  Patient is a 67 y.o. female with history of DM-2 who was referred to the emergency room by her primary care practitioner for evaluation of an abnormal CT scan.  Apparently, since this past November-patient has had worsening abdominal pain and distention, a CT scan of the abdomen done in the outpatient setting showed extensive lung, liver, peritoneal and omental metastases with large amount of abscess.  She underwent paracentesis cytology back on 02/18/2017 suggestive of adenocarcinoma of unknown primary, oncology called to assist with further diagnosis and management.  Subjective:  Patient in bed, appears comfortable, denies any headache, no fever, no chest pain or pressure, no shortness of breath , no nausea but mild generalized abdominal pain. No focal weakness.  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 Lebron Conners to evaluate the patient and guide further plan, continue supportive care, have listed on moderate dose MS Contin, adjusted pain medications, continue to monitor.  UTI: Clinically no sepsis finish Rocephin treatment.  HTN - adjust beta-blocker dose for better control.  PRN hydralazine ordered as well.  Hyponatremia.  Continues to have poor oral intake gentle normal saline and monitor.    DM-2: CBGs relatively stable with SSI-continue to hold all oral hypoglycemic agents.  CBG (last 3)   Recent Labs    02/17/17 2040 02/18/17 0636 02/18/17 0833  GLUCAP 180* 218*  191*     DVT Prophylaxis: SCD's  Code Status: Full code   Family Communication:  at bedside  Disposition Plan: Remain inpatient  Antimicrobial agents: Anti-infectives (From admission, onward)   Start     Dose/Rate Route Frequency Ordered Stop   02/15/17 1200  vancomycin (VANCOCIN) IVPB 1000 mg/200 mL premix  Status:  Discontinued     1,000 mg 200 mL/hr over 60 Minutes Intravenous Every 36 hours 02/14/17 0303 02/14/17 1026   02/14/17 1500  cefTRIAXone (ROCEPHIN) 1 g in dextrose 5 % 50 mL IVPB     1 g 100 mL/hr over 30 Minutes Intravenous Every 24 hours 02/14/17 1026     02/14/17 0600  piperacillin-tazobactam (ZOSYN) IVPB 3.375 g  Status:  Discontinued     3.375 g 12.5 mL/hr over 240 Minutes Intravenous Every 8 hours 02/14/17 0304 02/14/17 1026   02/05/2017 2330  piperacillin-tazobactam (ZOSYN) IVPB 3.375 g     3.375 g 100 mL/hr over 30 Minutes Intravenous  Once 01/30/2017 2317 02/14/17 0028   02/22/2017 2330  vancomycin (VANCOCIN) IVPB 1000 mg/200 mL premix     1,000 mg 200 mL/hr over 60 Minutes Intravenous  Once 02/14/2017 2317 02/14/17 0159      Procedures: None  CONSULTS:  IR  Time spent: 25- minutes-Greater than 50% of this time was spent in counseling, explanation of diagnosis, planning of further management, and coordination of care.  MEDICATIONS: Scheduled Meds: . bisacodyl  10 mg Oral Daily  . docusate sodium  200 mg Oral BID  . feeding supplement (GLUCERNA SHAKE)  237 mL Oral TID BM  .  insulin aspart  0-9 Units Subcutaneous Q6H  . ipratropium-albuterol  3 mL Nebulization BID  . magnesium hydroxide  30 mL Oral BID  . metoprolol tartrate  50 mg Oral BID  . morphine  60 mg Oral Q12H  . polyethylene glycol  17 g Oral BID   Continuous Infusions: . cefTRIAXone (ROCEPHIN)  IV Stopped (02/17/17 1631)   PRN Meds:.acetaminophen **OR** [DISCONTINUED] acetaminophen, benzonatate, HYDROcodone-acetaminophen, morphine injection, ondansetron (ZOFRAN) IV,  senna-docusate   PHYSICAL EXAM: Vital signs: Vitals:   02/17/17 1610 02/17/17 1917 02/17/17 2041 02/18/17 0627  BP: (!) 140/97  (!) 142/92 (!) 165/98  Pulse: (!) 107  (!) 116 (!) 117  Resp: (!) 22  (!) 22 (!) 23  Temp: 98.4 F (36.9 C)  97.9 F (36.6 C) 97.9 F (36.6 C)  TempSrc: Oral  Oral Oral  SpO2: 97% 96% 98% 93%  Weight:    54.1 kg (119 lb 4.3 oz)  Height:       Filed Weights   02/15/17 0550 02/16/17 0700 02/18/17 0627  Weight: 53.1 kg (117 lb 1 oz) 54.5 kg (120 lb 2.4 oz) 54.1 kg (119 lb 4.3 oz)   Body mass index is 24.5 kg/m.   Exam  Awake Alert, Oriented X 3, No new F.N deficits, Normal affect [place].AT,PERRAL Supple Neck,No JVD, No cervical lymphadenopathy appriciated.  Symmetrical Chest wall movement, Good air movement bilaterally, CTAB RRR,No Gallops, Rubs or new Murmurs, No Parasternal Heave +ve B.Sounds, Abd Soft but slightly distended with mild generalized tenderness, No organomegaly appriciated, No rebound - guarding or rigidity. No Cyanosis, Clubbing or edema, No new Rash or bruise  I have personally reviewed following labs and imaging studies  LABORATORY DATA:  CBC: Recent Labs  Lab 02/23/2017 1117  02/14/17 0223 02/15/17 0417 02/16/17 0337 02/17/17 0448 02/18/17 0429  WBC 13.6*   < > 16.9* 12.0* 12.8* 17.2* 15.3*  NEUTROABS 10.5*  --   --   --   --   --   --   HGB 14.8   < > 12.6 13.7 14.3 14.3 12.7  HCT 46.1   < > 39.4 42.5 45.1 43.5 40.5  MCV 81   < > 81.7 80.8 82.4 81.6 82.0  PLT 322   < > 253 301 349 406* 442*   < > = values in this interval not displayed.    Basic Metabolic Panel: Recent Labs  Lab 02/14/17 0223 02/15/17 0417 02/16/17 0337 02/17/17 0448 02/18/17 0429  NA 132* 130* 136 133* 130*  K 3.5 3.4* 5.5* 5.5* 5.2*  CL 102 100* 105 101 94*  CO2 20* 23 25 21* 26  GLUCOSE 191* 196* 162* 128* 201*  BUN 13 7 9 17  30*  CREATININE 0.66 0.50 0.66 0.83 0.99  CALCIUM 7.5* 7.5* 8.2* 8.4* 8.3*    GFR: Estimated Creatinine  Clearance: 41.9 mL/min (by C-G formula based on SCr of 0.99 mg/dL).  Liver Function Tests: Recent Labs  Lab 02/06/2017 1048 02/10/2017 1427 02/19/2017 1724 02/14/17 0223 02/18/17 0429  AST 23 26 25 23 26   ALT 23 26 25 19 20   ALKPHOS 105 102 90 71 77  BILITOT 0.4 0.4 0.7 1.3* 0.4  PROT 6.5 6.4 6.7 5.6* 6.2*  ALBUMIN 3.1* 3.1* 2.6* 2.3* 2.3*   Recent Labs  Lab 02/27/2017 1117 02/26/2017 1724  LIPASE 8* 14   No results for input(s): AMMONIA in the last 168 hours.  Coagulation Profile: Recent Labs  Lab 02/14/17 0223 02/18/17 0429  INR 1.08 0.97  Cardiac Enzymes: No results for input(s): CKTOTAL, CKMB, CKMBINDEX, TROPONINI in the last 168 hours.  BNP (last 3 results) No results for input(s): PROBNP in the last 8760 hours.  HbA1C: No results for input(s): HGBA1C in the last 72 hours.  CBG: Recent Labs  Lab 02/17/17 1156 02/17/17 1732 02/17/17 2040 02/18/17 0636 02/18/17 0833  GLUCAP 183* 167* 180* 218* 191*    Lipid Profile: No results for input(s): CHOL, HDL, LDLCALC, TRIG, CHOLHDL, LDLDIRECT in the last 72 hours.  Thyroid Function Tests: Recent Labs    02/16/17 0912  TSH 4.368    Anemia Panel: No results for input(s): VITAMINB12, FOLATE, FERRITIN, TIBC, IRON, RETICCTPCT in the last 72 hours.  Urine analysis:    Component Value Date/Time   COLORURINE YELLOW 02/16/2017 1848   APPEARANCEUR CLEAR 01/29/2017 1848   LABSPEC >1.046 (H) 02/21/2017 1848   PHURINE 6.0 02/06/2017 1848   GLUCOSEU NEGATIVE 02/01/2017 1848   HGBUR SMALL (A) 02/20/2017 1848   BILIRUBINUR NEGATIVE 02/25/2017 1848   BILIRUBINUR moderate (A) 02/12/2017 1127   KETONESUR 80 (A) 02/07/2017 1848   PROTEINUR NEGATIVE 02/17/2017 1848   UROBILINOGEN 1.0 01/30/2017 1127   NITRITE NEGATIVE 01/31/2017 1848   LEUKOCYTESUR MODERATE (A) 02/17/2017 1848    Sepsis Labs: Lactic Acid, Venous    Component Value Date/Time   LATICACIDVEN 1.78 02/04/2017 2110    MICROBIOLOGY: Recent Results  (from the past 240 hour(s))  Blood Culture (routine x 2)     Status: None   Collection Time: 02/01/2017  8:28 PM  Result Value Ref Range Status   Specimen Description BLOOD RIGHT ANTECUBITAL  Final   Special Requests   Final    BOTTLES DRAWN AEROBIC AND ANAEROBIC Blood Culture adequate volume   Culture   Final    NO GROWTH 5 DAYS Performed at [facility], [place] [address], [place], [place] [address]    Report Status 02/18/2017 FINAL  Final  Blood Culture (routine x 2)     Status: None   Collection Time: 02/04/2017  8:33 PM  Result Value Ref Range Status   Specimen Description BLOOD RIGHT WRIST  Final   Special Requests   Final    BOTTLES DRAWN AEROBIC AND ANAEROBIC Blood Culture adequate volume   Culture   Final    NO GROWTH 5 DAYS Performed at [facility], [place] [address], [place], [place] [address]    Report Status 02/18/2017 FINAL  Final  Culture, body fluid-bottle     Status: None (Preliminary result)   Collection Time: 02/14/17  9:58 AM  Result Value Ref Range Status   Specimen Description PERITONEAL  Final   Special Requests NONE  Final   Culture   Final    NO GROWTH 4 DAYS Performed at [place] [address], [place], [place] [address]    Report Status PENDING  Incomplete  Gram stain     Status: None   Collection Time: 02/14/17  9:58 AM  Result Value Ref Range Status   Specimen Description PERITONEAL  Final   Special Requests NONE  Final   Gram Stain   Final    CYTOSPIN SMEAR WBC PRESENT,BOTH PMN AND MONONUCLEAR NO ORGANISMS SEEN Performed at [facility], [place] [address], [place], [place] [address]    Report Status 02/14/2017 FINAL  Final  MRSA PCR Screening     Status: None   Collection Time: 02/14/17 11:55 AM  Result Value Ref Range Status   MRSA  by PCR NEGATIVE NEGATIVE Final    Comment:        The GeneXpert MRSA Assay (FDA approved for NASAL specimens only), is one component of a comprehensive MRSA colonization surveillance  program. It is not intended to diagnose MRSA infection nor to guide or monitor treatment for MRSA infections.   Culture, Urine     Status: None   Collection Time: 02/14/17  2:00 PM  Result Value Ref Range Status   Specimen Description URINE, CLEAN CATCH  Final   Special Requests NONE  Final   Culture   Final    NO GROWTH Performed at [facility], [address] [place], [place] [address]    Report Status 02/16/2017 FINAL  Final    RADIOLOGY STUDIES/RESULTS: Dg Chest 2 View  Result Date: 01/30/2017 CLINICAL DATA:  67 y/o  F; fever and increasing shortness of breath. EXAM: CHEST  2 VIEW COMPARISON:  01/08/2013 chest radiograph FINDINGS: Stable normal cardiac silhouette given projection and technique. Diffuse patchy opacifications. No pleural effusion or pneumothorax. No acute osseous abnormality is evident. IMPRESSION: Diffuse patchy opacification of the lungs probably representing multifocal pneumonia. Electronically Signed   By: Kristine Garbe M.D.   On: 02/26/2017 22:26   Ct Angio Chest Pe W/cm &/or Wo Cm  Result Date: 02/17/2017 CLINICAL DATA:  Cancer cells and elevated white cells EXAM: CT ANGIOGRAPHY CHEST WITH CONTRAST TECHNIQUE: Multidetector CT imaging of the chest was performed using the standard protocol during bolus administration of intravenous contrast. Multiplanar CT image reconstructions and MIPs were obtained to evaluate the vascular anatomy. CONTRAST:  192mL ISOVUE-370 IOPAMIDOL (ISOVUE-370) INJECTION 76% COMPARISON:  02/21/2017 radiograph, CT abdomen pelvis 02/20/2017 FINDINGS: Cardiovascular: Satisfactory opacification of the pulmonary arteries to the segmental level. Respiratory motion artifact limits assessment for emboli particularly in the lower lobes and right middle lobe. No central filling defects are visualized. Nonaneurysmal aorta. Mild aortic atherosclerosis. Normal heart size. No pericardial effusion Mediastinum/Nodes: 9 mm lymph node adjacent  to the left aortic arch. Midline trachea. No thyroid mass. Esophagus within normal limits Lungs/Pleura: Innumerable bilateral pulmonary nodules consistent with extensive metastatic disease. Mild bilateral septal thickening with nodularity, possible lymphangitic tumor spread. No pleural effusion or pneumothorax. Upper Abdomen: Ascites in the upper abdomen with partially visualized hepatic mass lesion and multiple mesenteric/omental nodules in the left upper quadrant. Musculoskeletal: No acute or suspicious lesion Review of the MIP images confirms the above findings. IMPRESSION: 1. Respiratory motion artifact limits evaluation for emboli as discussed above. No definite central PE is seen 2. Extensive pulmonary nodules consistent with widespread metastatic disease. 3. Ascites in the upper abdomen with partially visible hepatic metastatic lesion and multiple metastatic foci in the left upper quadrant of the abdomen Aortic Atherosclerosis (ICD10-I70.0). Electronically Signed   By: Donavan Foil M.D.   On: 02/20/2017 23:49   Ct Abdomen Pelvis W Contrast  Result Date: 02/06/2017 CLINICAL DATA:  Left abdomen pain since November. Assess for diverticulitis. EXAM: CT ABDOMEN AND PELVIS WITH CONTRAST TECHNIQUE: Multidetector CT imaging of the abdomen and pelvis was performed using the standard protocol following bolus administration of intravenous contrast. CONTRAST:  164mL ISOVUE-300 IOPAMIDOL (ISOVUE-300) INJECTION 61% COMPARISON:  Ultrasound December 30, 2003 FINDINGS: Lower chest: Innumerable small nodules identified throughout the lung bases. The heart size is normal. Hepatobiliary: Several heterogeneous enhancing masses are identified within the liver, largest is in the right/left lobe liver measuring 7.4 x 4.6 cm. The largest mass is inseparable from the adjacent gallbladder/adjacent invasion is not excluded. The  common bile duct is normal caliber at the head of pancreas. Pancreas: Unremarkable. No pancreatic ductal  dilatation or surrounding inflammatory changes. Spleen: No acute abnormalities identified in the spleen. 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 Adrenals/Urinary Tract: The adrenal glands are normal. There is a small simple cysts in the lateral mid to lower pole right kidney. No focal left kidney lesion is noted. There is no hydronephrosis bilaterally. The bladder is decompressed limiting evaluation. Stomach/Bowel: Multiple dilated small bowel loops are identified in the upper and mid abdomen. Colon is decompressed with mild diffuse bowel wall thickening probably secondary to surrounding ascites. Contrast is noted in the colon. Vascular/Lymphatic: Aortic atherosclerosis. No enlarged abdominal or pelvic lymph nodes. Reproductive: The uterus is unremarkable. The ovaries are not definitely seen. There is an extensive amount of ascites throughout the abdomen and pelvis. Innumerable omental and peritoneal heterogeneously enhancing masses are identified throughout the abdomen and pelvis. Other: Extensive ascites identified in the abdomen and pelvis. Musculoskeletal: No acute abnormality identified. IMPRESSION: Findings consistent with extensive lung, liver metastasis, peritoneal and omental metastasis with large amount of ascites in the abdomen and pelvis. Multiple dilated small bowel loops in the upper and mid abdomen with contrast noted in the colon, probably due to ileus. Electronically Signed   By: Abelardo Diesel M.D.   On: 02/03/2017 16:13   US Paracentesis  Result Date: 02/14/2017 INDICATION: Abdominal distention secondary to ascites. Abnormal imaging studies highly concerning for metastatic process. Request for diagnostic and therapeutic paracentesis. EXAM: ULTRASOUND GUIDED RIGHT LOWER QUADRANT PARACENTESIS MEDICATIONS: N None one. COMPLICATIONS: None immediate. PROCEDURE: Informed written consent was obtained from the patient after a discussion of the risks, benefits and alternatives to  treatment. A timeout was performed prior to the initiation of the procedure. Initial ultrasound scanning demonstrates a large amount of ascites within the right lower abdominal quadrant. The right lower abdomen was prepped and draped in the usual sterile fashion. 1% lidocaine with epinephrine was used for local anesthesia. Following this, a 19 gauge, 7-cm, Yueh catheter was introduced. An ultrasound image was saved for documentation purposes. The paracentesis was performed. The catheter was removed and a dressing was applied. The patient tolerated the procedure well without immediate post procedural complication. FINDINGS: A total of approximately 2.5 L of slightly hazy, yellow fluid was removed. Samples were sent to the laboratory as requested by the clinical team. IMPRESSION: Successful ultrasound-guided paracentesis yielding 2.5 liters of peritoneal fluid. Read by: Ascencion Dike PA-C Electronically Signed   By: Markus Daft M.D.   On: 02/14/2017 11:20   Dg Abd Portable 1v  Result Date: 02/16/2017 CLINICAL DATA:  67 year old female with history of nausea, vomiting and abdominal pain with worsening shortness of breath. EXAM: PORTABLE ABDOMEN - 1 VIEW COMPARISON:  No prior abdominal radiograph. CT the abdomen and pelvis 02/06/2017. FINDINGS: Gas and stool are noted throughout the colon. No pathologic dilatation of small bowel or colon. Several nondilated gas-filled loops of small bowel are noted (nonspecific). No definite pneumoperitoneum noted on this single supine image. Retained contrast material is evident in the cecum. Faint calcifications projecting over the right upper quadrant corresponding to calcifications in known hepatic mass. IMPRESSION: 1. Nonobstructive bowel gas pattern. 2. No pneumoperitoneum. Electronically Signed   By: Vinnie Langton M.D.   On: 02/16/2017 09:01     LOS: 4 days   Signature  Lala Lund M.D on 02/18/2017 at 10:51 AM  Between 7am to 7pm - Pager - [phone] ( page  via [email or web address], text  pages only, please mention full 10 digit call back number).  After 7pm go to [email or web address] - password [email or web address]

## 2017-02-18 NOTE — Plan of Care (Signed)
Patient continues to c/o pain in her abdomen rated at 7, medicated x 2 with IV MS with some improvement per patient.  Unable to eat any foods, sipping small amounts of clear liquids but states any significant fluid intake causes her to feel bloated and increases her abdominal pain.  Also c/o shortness of breath with activity and at rest depending on positioning.  Multiple family members at bedside.

## 2017-02-18 NOTE — Progress Notes (Signed)
Patient still complaining of increased abdominal discomfort and shortness of breath with activity and at rest depending on position. Family has voiced that they would like patient to have another paracentesis if possible. Patient stated she got a lot of relief after her previous one. Told family I would relay the message and also wrote it on white board under question section. Did call and get one time order pain medication for breakthrough pain and dr also increased from every 4 hours to every 3 hours. RT is coming to give patient breathing treatment shortly. Informed family and patient of current plan. All are very understanding and appreciative.

## 2017-02-18 NOTE — Progress Notes (Signed)
Soap suds enema administered as per order.  RN unable to feel any stool in rectum, no results from enema at time of administration.  Will continue to monitor.  Patient only drinking liquids such as juice and water, some glucerna.  Family at bedside.

## 2017-02-18 NOTE — Progress Notes (Signed)
Pharmacy Antibiotic Note  Jessica Gregory is a 67 y.o. female admitted on 02/14/2017 with pneumonia.  Pharmacy has been consulted for zosyn and vancomycin dosing.  Plan: Zosyn 3.375g IV q8h (4 hour infusion).  Vancomycin 1250 mg x1 then 500 mg IV q24h for est AUC=473 Goal AUC = 400-500 Daily Scr F/u cultures/levels  Height: 4' 10.5" (148.6 cm) Weight: 119 lb 4.3 oz (54.1 kg) IBW/kg (Calculated) : 42.05  Temp (24hrs), Avg:97.8 F (36.6 C), Min:97.5 F (36.4 C), Max:98.2 F (36.8 C)  Recent Labs  Lab 02/19/2017 2110 02/14/17 0223 02/15/17 0417 02/16/17 0337 02/17/17 0448 02/18/17 0429  WBC  --  16.9* 12.0* 12.8* 17.2* 15.3*  CREATININE  --  0.66 0.50 0.66 0.83 0.99  LATICACIDVEN 1.78  --   --   --   --   --     Estimated Creatinine Clearance: 41.9 mL/min (by C-G formula based on SCr of 0.99 mg/dL).    No Known Allergies  Antimicrobials this admission: 1/18 >> zosyn/vancomycin >> 1/18 1/19 rocephin >> 1/22 1/23 zosyn >>  1/23 vancomycin >>   Dose adjustments this admission:   Microbiology results:  BCx:   UCx:    Sputum:    MRSA PCR:   Thank you for allowing pharmacy to be a part of this patient's care.  Dorrene German 02/18/2017 11:13 PM

## 2017-02-18 NOTE — Telephone Encounter (Signed)
I am following along with the patient's progress in the computer.  I would like her to see me after discharge to help with coordination of care.  Oncology is managing currently as patient is still in the hospital.  Hospitalist will help with discharge planning.

## 2017-02-18 NOTE — Progress Notes (Signed)
Patient up on side of bed after breathing treatment stating she is having a hard time breathing. Paged Dr. Baltazar Najjar and she ordered stat CXR and additional breathing treatment. Also increased morphine to every 2 hrs instead of every 3hrs for 5 doses and then day team can change.

## 2017-02-18 NOTE — Consult Note (Signed)
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 [place] female who is currently admitted to the hospital after presentation with abdominal pain, found to have widely metastatic malignancy.  Please see oncological history below for details.  Her past medical history is significant for diabetes mellitus type 2, hyperlipidemia.  Patient initially presented with progressive abdominal discomfort started approximately 2 months ago.  Over the past several days, patient has developed progressive abdominal distention and pain.  She presented to urgent clinic and underwent imaging with CT of the abdomen.  Based on the results, patient was transferred to the emergency room.  She was found to have no fever, but tachycardia, and persistent nonproductive cough.  2 weeks of poor appetite, decreasing activity.  No previous history of cancer, no family history of cancer, no alcohol or tobacco use.  At the present time, patient continues to experience significant amount of abdominal pain.  Currently no nausea, but feeling significantly constipated with minimal stools output.  Patient is only able to tolerate minimal amount of liquids due to feeling of abdominal bloating and discomfort.  Has not been able to tolerate any solid food for several days.  No urinary complaints.  No new neurological deficits.  Oncological History: --CT A/P, 01/17/189: Several heterogeneous enhancing masses are identified within the liver, largest is in the right/left lobe liver measuring 7.4 x 4.6 cm. The largest mass is inseparable from the adjacent gallbladder/adjacent invasion is not excluded. The common bile duct is normal caliber at the head of pancreas. Pancreas: Unremarkable. No pancreatic ductal dilatation or surrounding inflammatory changes. Stomach/Bowel: Multiple dilated small bowel loops are  identified in the upper and mid abdomen. Colon is decompressed with mild diffuse bowel wall thickening probably secondary to surrounding ascites. Contrast is noted in the colon. Vascular/Lymphatic: Aortic atherosclerosis. No enlarged abdominal or pelvic lymph nodes. Reproductive: The uterus is unremarkable. The ovaries are not definitely seen. There is an extensive amount of ascites throughout the abdomen and pelvis. Innumerable omental and peritoneal heterogeneously enhancing masses are identified throughout the abdomen and pelvis. Other: Extensive ascites identified in the abdomen and pelvis. --CTA Chest, 02/16/2017: Cardiovascular: No central filling defects are visualized. No pericardial effusion. Mediastinum/Nodes: 9 mm lymph node adjacent to the left aortic arch. Midline trachea. No thyroid mass. Esophagus within normal limits. Lungs/Pleura: Innumerable bilateral pulmonary nodules consistent with extensive metastatic disease. Mild bilateral septal thickening with nodularity, possible lymphangitic tumor spread. No pleural effusion or pneumothorax. --Paracentesis, 02/14/17: 2.5L removed; Pathology -- malignant cell present consistent with adenocarcinoma; IHC -- positive for CK7, MOC-31 & negative for S100, Melan A, SOX-10, CDX-2, cytokeratin 20, cytokeratin 5/6, Napsin A, TTF-1, and WT-1.      Past Medical History:  Diagnosis Date  . Diabetes ([place])   . Language barrier 11/16/2014   From [place], does not yet speak English  :  History reviewed. No pertinent surgical history.:   Current Facility-Administered Medications:  .  0.9 %  sodium chloride infusion, , Intravenous, Continuous, Thurnell Lose, MD, Last Rate: 75 mL/hr at 02/18/17 1205 .  acetaminophen (TYLENOL) tablet 650 mg, 650 mg, Oral, Q6H PRN **OR** [DISCONTINUED] acetaminophen (TYLENOL) suppository 650 mg, 650 mg, Rectal, Q6H PRN, Amin, Ankit Chirag, MD .  benzonatate (TESSALON) capsule 200 mg, 200 mg, Oral, TID PRN, Jonetta Osgood, MD, 200 mg at 02/18/17 0040 .  bisacodyl (DULCOLAX)  EC tablet 10 mg, 10 mg, Oral, Daily, Thurnell Lose, MD, 10 mg at 02/18/17 0845 .  docusate sodium (COLACE) capsule 200 mg, 200 mg, Oral, BID, Thurnell Lose, MD, 200 mg at 02/18/17 0844 .  feeding supplement (GLUCERNA SHAKE) (GLUCERNA SHAKE) liquid 237 mL, 237 mL, Oral, TID BM, Ghimire, Shanker M, MD, 237 mL at 02/17/17 2057 .  hydrALAZINE (APRESOLINE) injection 10 mg, 10 mg, Intravenous, Q6H PRN, Thurnell Lose, MD .  HYDROcodone-acetaminophen (NORCO/VICODIN) 5-325 MG per tablet 1-2 tablet, 1-2 tablet, Oral, Q4H PRN, Damita Lack, MD, 2 tablet at 02/16/17 1723 .  insulin aspart (novoLOG) injection 0-9 Units, 0-9 Units, Subcutaneous, Q6H, Thurnell Lose, MD, 2 Units at 02/18/17 1211 .  ipratropium-albuterol (DUONEB) 0.5-2.5 (3) MG/3ML nebulizer solution 3 mL, 3 mL, Nebulization, BID, Stallings, Zoe A, MD, 3 mL at 02/18/17 1147 .  magnesium hydroxide (MILK OF MAGNESIA) suspension 30 mL, 30 mL, Oral, BID, Thurnell Lose, MD, 30 mL at 02/18/17 0850 .  metoprolol tartrate (LOPRESSOR) tablet 100 mg, 100 mg, Oral, BID, Singh, Prashant K, MD .  morphine (MS CONTIN) 12 hr tablet 60 mg, 60 mg, Oral, Q12H, Thurnell Lose, MD, 60 mg at 02/18/17 0843 .  morphine 4 MG/ML injection 1 mg, 1 mg, Intravenous, Q3H PRN, Amin, Ankit Chirag, MD, 1 mg at 02/18/17 1435 .  ondansetron (ZOFRAN) injection 4 mg, 4 mg, Intravenous, Q6H PRN, Blount, Xenia T, NP, 4 mg at 02/15/17 0857 .  polyethylene glycol (MIRALAX / GLYCOLAX) packet 17 g, 17 g, Oral, BID, Thurnell Lose, MD, 17 g at 02/18/17 0844 .  senna-docusate (Senokot-S) tablet 1 tablet, 1 tablet, Oral, QHS PRN, Amin, Ankit Chirag, MD:  . bisacodyl  10 mg Oral Daily  . docusate sodium  200 mg Oral BID  . feeding supplement (GLUCERNA SHAKE)  237 mL Oral TID BM  . insulin aspart  0-9 Units Subcutaneous Q6H  . ipratropium-albuterol  3 mL Nebulization BID  . magnesium hydroxide  30  mL Oral BID  . metoprolol tartrate  100 mg Oral BID  . morphine  60 mg Oral Q12H  . polyethylene glycol  17 g Oral BID  :  No Known Allergies:  History reviewed. No pertinent family history.  Social History   Socioeconomic History  . Marital status: Married    Spouse name: Not on file  . Number of children: Not on file  . Years of education: Not on file  . Highest education level: Not on file  Social Needs  . Financial resource strain: Somewhat hard  . Food insecurity - worry: Sometimes true  . Food insecurity - inability: Sometimes true  . Transportation needs - medical: No  . Transportation needs - non-medical: No  Occupational History  . Occupation: work at [employer]  . Smoking status: Never Smoker  . Smokeless tobacco: Never Used  Substance and Sexual Activity  . Alcohol use: No  . Drug use: No  . Sexual activity: No  Other Topics Concern  . Not on file  Social History Narrative   Education: Other   Exercise: No   Review of Systems: As per HPI.  All other systems are negative.  Physical Exam:  Blood pressure (!) 143/85, pulse (!) 103, temperature (!) 97.5 F (36.4 C), temperature source Oral, resp. rate 16, height 4' 10.5" (1.486 m), weight 119 lb 4.3 oz (54.1 kg), SpO2 98 %.  Patient appears generally ill, somnolent likely due to recently administered morphine.  Wakes up and interacts with voice stimulation.  Appears to be answering questions appropriately in [place] HEENT: Anicteric sclera, moist mucous membranes Lungs: Decreased breath sounds bilaterally with expiratory wheezing and frequent nonproductive cough Cardiac: S1/S2, regular, no murmurs Abdomen: Distended, full, but not tense, diffusely tender.  Positive bowel sounds Lymph nodes: No palpable lymphadenopathy in the cervical, supraclavicular, axillary, or inguinal regions Neurologic: No gross focal neurological deficits Skin: No rash, petechiae, or ecchymosis. Musculoskeletal:  Bilateral lower extremity swelling.  LABS:  Recent Labs    02/17/17 0448 02/18/17 0429  WBC 17.2* 15.3*  HGB 14.3 12.7  HCT 43.5 40.5  PLT 406* 442*    Recent Labs    02/17/17 0448 02/18/17 0429  NA 133* 130*  K 5.5* 5.2*  CL 101 94*  CO2 21* 26  GLUCOSE 128* 201*  BUN 17 30*  CREATININE 0.83 0.99  CALCIUM 8.4* 8.3*      RADIOLOGY:  Dg Chest 2 View  Result Date: 02/24/2017 CLINICAL DATA:  67 y/o  F; fever and increasing shortness of breath. EXAM: CHEST  2 VIEW COMPARISON:  01/08/2013 chest radiograph FINDINGS: Stable normal cardiac silhouette given projection and technique. Diffuse patchy opacifications. No pleural effusion or pneumothorax. No acute osseous abnormality is evident. IMPRESSION: Diffuse patchy opacification of the lungs probably representing multifocal pneumonia. Electronically Signed   By: Kristine Garbe M.D.   On: 02/14/2017 22:26   Ct Angio Chest Pe W/cm &/or Wo Cm  Result Date: 02/21/2017 CLINICAL DATA:  Cancer cells and elevated white cells EXAM: CT ANGIOGRAPHY CHEST WITH CONTRAST TECHNIQUE: Multidetector CT imaging of the chest was performed using the standard protocol during bolus administration of intravenous contrast. Multiplanar CT image reconstructions and MIPs were obtained to evaluate the vascular anatomy. CONTRAST:  178mL ISOVUE-370 IOPAMIDOL (ISOVUE-370) INJECTION 76% COMPARISON:  02/24/2017 radiograph, CT abdomen pelvis 02/14/2017 FINDINGS: Cardiovascular: Satisfactory opacification of the pulmonary arteries to the segmental level. Respiratory motion artifact limits assessment for emboli particularly in the lower lobes and right middle lobe. No central filling defects are visualized. Nonaneurysmal aorta. Mild aortic atherosclerosis. Normal heart size. No pericardial effusion Mediastinum/Nodes: 9 mm lymph node adjacent to the left aortic arch. Midline trachea. No thyroid mass. Esophagus within normal limits Lungs/Pleura: Innumerable  bilateral pulmonary nodules consistent with extensive metastatic disease. Mild bilateral septal thickening with nodularity, possible lymphangitic tumor spread. No pleural effusion or pneumothorax. Upper Abdomen: Ascites in the upper abdomen with partially visualized hepatic mass lesion and multiple mesenteric/omental nodules in the left upper quadrant. Musculoskeletal: No acute or suspicious lesion Review of the MIP images confirms the above findings. IMPRESSION: 1. Respiratory motion artifact limits evaluation for emboli as discussed above. No definite central PE is seen 2. Extensive pulmonary nodules consistent with widespread metastatic disease. 3. Ascites in the upper abdomen with partially visible hepatic metastatic lesion and multiple metastatic foci in the left upper quadrant of the abdomen Aortic Atherosclerosis (ICD10-I70.0). Electronically Signed   By: Donavan Foil M.D.   On: 02/14/2017 23:49   Ct Abdomen Pelvis W Contrast  Result Date: 01/29/2017 CLINICAL DATA:  Left abdomen pain since November. Assess for diverticulitis. EXAM: CT ABDOMEN AND PELVIS WITH CONTRAST TECHNIQUE: Multidetector CT imaging of the abdomen and pelvis was performed using the standard protocol following bolus administration of intravenous contrast. CONTRAST:  12mL ISOVUE-300 IOPAMIDOL (ISOVUE-300) INJECTION 61% COMPARISON:  Ultrasound December 30, 2003 FINDINGS: Lower chest: Innumerable small nodules identified throughout the lung bases. The heart size is normal. Hepatobiliary: Several heterogeneous enhancing masses are identified  within the liver, largest is in the right/left lobe liver measuring 7.4 x 4.6 cm. The largest mass is inseparable from the adjacent gallbladder/adjacent invasion is not excluded. The common bile duct is normal caliber at the head of pancreas. Pancreas: Unremarkable. No pancreatic ductal dilatation or surrounding inflammatory changes. Spleen: No acute abnormalities identified in the spleen. Extensive  vascular varices are identified around the spleen. Adrenals/Urinary Tract: The adrenal glands are normal. There is a small simple cysts in the lateral mid to lower pole right kidney. No focal left kidney lesion is noted. There is no hydronephrosis bilaterally. The bladder is decompressed limiting evaluation. Stomach/Bowel: Multiple dilated small bowel loops are identified in the upper and mid abdomen. Colon is decompressed with mild diffuse bowel wall thickening probably secondary to surrounding ascites. Contrast is noted in the colon. Vascular/Lymphatic: Aortic atherosclerosis. No enlarged abdominal or pelvic lymph nodes. Reproductive: The uterus is unremarkable. The ovaries are not definitely seen. There is an extensive amount of ascites throughout the abdomen and pelvis. Innumerable omental and peritoneal heterogeneously enhancing masses are identified throughout the abdomen and pelvis. Other: Extensive ascites identified in the abdomen and pelvis. Musculoskeletal: No acute abnormality identified. IMPRESSION: Findings consistent with extensive lung, liver metastasis, peritoneal and omental metastasis with large amount of ascites in the abdomen and pelvis. Multiple dilated small bowel loops in the upper and mid abdomen with contrast noted in the colon, probably due to ileus. Electronically Signed   By: Abelardo Diesel M.D.   On: 02/07/2017 16:13   US Paracentesis  Result Date: 02/14/2017 INDICATION: Abdominal distention secondary to ascites. Abnormal imaging studies highly concerning for metastatic process. Request for diagnostic and therapeutic paracentesis. EXAM: ULTRASOUND GUIDED RIGHT LOWER QUADRANT PARACENTESIS MEDICATIONS: N None one. COMPLICATIONS: None immediate. PROCEDURE: Informed written consent was obtained from the patient after a discussion of the risks, benefits and alternatives to treatment. A timeout was performed prior to the initiation of the procedure. Initial ultrasound scanning demonstrates  a large amount of ascites within the right lower abdominal quadrant. The right lower abdomen was prepped and draped in the usual sterile fashion. 1% lidocaine with epinephrine was used for local anesthesia. Following this, a 19 gauge, 7-cm, Yueh catheter was introduced. An ultrasound image was saved for documentation purposes. The paracentesis was performed. The catheter was removed and a dressing was applied. The patient tolerated the procedure well without immediate post procedural complication. FINDINGS: A total of approximately 2.5 L of slightly hazy, yellow fluid was removed. Samples were sent to the laboratory as requested by the clinical team. IMPRESSION: Successful ultrasound-guided paracentesis yielding 2.5 liters of peritoneal fluid. Read by: Ascencion Dike PA-C Electronically Signed   By: Markus Daft M.D.   On: 02/14/2017 11:20   Dg Abd Portable 1v  Result Date: 02/16/2017 CLINICAL DATA:  67 year old female with history of nausea, vomiting and abdominal pain with worsening shortness of breath. EXAM: PORTABLE ABDOMEN - 1 VIEW COMPARISON:  No prior abdominal radiograph. CT the abdomen and pelvis 01/31/2017. FINDINGS: Gas and stool are noted throughout the colon. No pathologic dilatation of small bowel or colon. Several nondilated gas-filled loops of small bowel are noted (nonspecific). No definite pneumoperitoneum noted on this single supine image. Retained contrast material is evident in the cecum. Faint calcifications projecting over the right upper quadrant corresponding to calcifications in known hepatic mass. IMPRESSION: 1. Nonobstructive bowel gas pattern. 2. No pneumoperitoneum. Electronically Signed   By: Vinnie Langton M.D.   On: 02/16/2017 09:01    Assessment and  Plan:  67 year old female presenting with abdominal pain and at least partial bowel obstruction with findings of widely metastatic malignancy including peritoneal carcinomatosis, hepatic lesions, and multifocal bilateral pulmonary  involvement.  Peritoneal fluid positive for presence of cells consistent with adenocarcinoma.  At this time, differential diagnosis includes primary peritoneal cancer and absence of definitive ovarian mass, cholangiocarcinoma or gallbladder adenocarcinoma cannot be excluded as the largest mass in the liver may be the actual primary lesion.  Regardless of the primary source, we are dealing with widely metastatic incurable malignancy.  Patient is significantly disabled due to the burden of the disease including inability to maintain adequate nutrition and hydration on her own.  Additionally, her respiratory status appears to be undermined, but not yet compromised by the extensive lymphangitic spread into the lungs.  Recommendations: - We will obtain additional cancer markers including CEA, CA-125, CA 19-9 -We will review the findings tomorrow at 1530 with more family members present as today on the patient and her daughter were available in the room. -Once I have reviewed the situation with the patient, will will await results of additional immunohistochemical staining currently pending at pathology.  Depending on the findings of the immunohistochemistry and the tumor markers, we will likely need to proceed with systemic chemotherapy especially if ovarian or primary peritoneal cancers are suspected as dose tend to be chemosensitive.  If primary biliary or gallbladder cancers are favored, outlook is dismal as those cancers appear to be significantly less responsive to chemotherapy treatments. -Considering the current state of the patient, her first cycle of chemotherapy, if initiated, will likely need to be given while patient is still hospitalized as it is unlikely that she will be able to go home and return to cancer center to initiate therapy as outpatient.  In that eventuality, we will transfer patient to our service move her to the 3 W. and start the treatment after Infuse-a-Port or PICC line  placement.    Ardath Sax, MD 02/18/2017, 2:44 PM

## 2017-02-18 NOTE — Progress Notes (Signed)
Rad tech just arrived to do CXR and informed RT that dr wants another breathing treatment.

## 2017-02-19 ENCOUNTER — Other Ambulatory Visit: Payer: Self-pay | Admitting: Hematology and Oncology

## 2017-02-19 LAB — CBC
HEMATOCRIT: 41.3 % (ref 36.0–46.0)
Hemoglobin: 12.9 g/dL (ref 12.0–15.0)
MCH: 25.5 pg — ABNORMAL LOW (ref 26.0–34.0)
MCHC: 31.2 g/dL (ref 30.0–36.0)
MCV: 81.8 fL (ref 78.0–100.0)
PLATELETS: 345 10*3/uL (ref 150–400)
RBC: 5.05 MIL/uL (ref 3.87–5.11)
RDW: 13.7 % (ref 11.5–15.5)
WBC: 14.5 10*3/uL — ABNORMAL HIGH (ref 4.0–10.5)

## 2017-02-19 LAB — COMPREHENSIVE METABOLIC PANEL
ALK PHOS: 83 U/L (ref 38–126)
ALT: 21 U/L (ref 14–54)
AST: 31 U/L (ref 15–41)
Albumin: 2.4 g/dL — ABNORMAL LOW (ref 3.5–5.0)
Anion gap: 12 (ref 5–15)
BILIRUBIN TOTAL: 0.5 mg/dL (ref 0.3–1.2)
BUN: 28 mg/dL — AB (ref 6–20)
CALCIUM: 8.6 mg/dL — AB (ref 8.9–10.3)
CO2: 29 mmol/L (ref 22–32)
CREATININE: 0.71 mg/dL (ref 0.44–1.00)
Chloride: 93 mmol/L — ABNORMAL LOW (ref 101–111)
GFR calc Af Amer: >60 mL/min (ref >60)
GLUCOSE: 220 mg/dL — AB (ref 65–99)
Potassium: 4.9 mmol/L (ref 3.5–5.1)
Sodium: 134 mmol/L — ABNORMAL LOW (ref 135–145)
TOTAL PROTEIN: 6.8 g/dL (ref 6.5–8.1)

## 2017-02-19 LAB — CULTURE, BODY FLUID-BOTTLE: CULTURE: NO GROWTH

## 2017-02-19 LAB — GLUCOSE, CAPILLARY
Glucose-Capillary: 207 mg/dL — ABNORMAL HIGH (ref 65–99)
Glucose-Capillary: 192 mg/dL — ABNORMAL HIGH (ref 65–99)
Glucose-Capillary: 265 mg/dL — ABNORMAL HIGH (ref 65–99)
Glucose-Capillary: 252 mg/dL — ABNORMAL HIGH (ref 65–99)

## 2017-02-19 LAB — MAGNESIUM: MAGNESIUM: 2.4 mg/dL (ref 1.7–2.4)

## 2017-02-19 MED ORDER — LEVALBUTEROL HCL 0.63 MG/3ML IN NEBU
0.6300 mg | INHALATION_SOLUTION | Freq: Four times a day (QID) | RESPIRATORY_TRACT | Status: DC | PRN
  Administered 2017-02-19 – 2017-03-08 (×5): 0.63 mg via RESPIRATORY_TRACT
  Filled 2017-02-19 (×5): qty 3

## 2017-02-19 MED ORDER — METHYLPREDNISOLONE SODIUM SUCC 40 MG IJ SOLR
40.0000 mg | Freq: Once | INTRAMUSCULAR | Status: AC
  Administered 2017-02-19: 40 mg via INTRAVENOUS
  Filled 2017-02-19: qty 1

## 2017-02-19 MED ORDER — FUROSEMIDE 10 MG/ML IJ SOLN
40.0000 mg | Freq: Once | INTRAMUSCULAR | Status: AC
  Administered 2017-02-19: 40 mg via INTRAVENOUS
  Filled 2017-02-19: qty 4

## 2017-02-19 MED ORDER — IPRATROPIUM-ALBUTEROL 0.5-2.5 (3) MG/3ML IN SOLN
3.0000 mL | Freq: Three times a day (TID) | RESPIRATORY_TRACT | Status: DC
  Administered 2017-02-19 – 2017-02-22 (×8): 3 mL via RESPIRATORY_TRACT
  Filled 2017-02-19 (×9): qty 3

## 2017-02-19 MED ORDER — VANCOMYCIN HCL IN DEXTROSE 750-5 MG/150ML-% IV SOLN
750.0000 mg | INTRAVENOUS | Status: DC
  Administered 2017-02-19: 750 mg via INTRAVENOUS
  Filled 2017-02-19: qty 150

## 2017-02-19 NOTE — Progress Notes (Signed)
This RN assessed pt. Respiratory sounds. Inspiratory and expiratory wheezes noted. O2 sat 98% on 3 L O2. Pt. Had very labored breathing while ambulating to the bathroom and back to bed. On call NP Baltazar Najjar paged and made aware. No PRN breathing treatments ordered. Will continue to monitor and will carry out any new orders.  Drucilla Chalet, RN

## 2017-02-19 NOTE — Progress Notes (Signed)
Patient not able to lay down entire shift. Stated she had a hard time breathing and was in more discomfort if she laid down. Family member at bedside. Does state she fells better now than beginning of shift.

## 2017-02-19 NOTE — Progress Notes (Signed)
Patient continues to have labored breathing at rest, audible crackles. Edema to BLE. IVF stopped this AM. O2 Sats stable on 3L/. MD aware. Family at bedside and concerned- requesting paracentesis and enema. MD [place] paged to be made aware.

## 2017-02-19 NOTE — Progress Notes (Signed)
Pharmacy Antibiotic Note  Jessica Gregory is a 67 y.o. female admitted on 02/03/2017 with sepsis 2/2 UTI, for which patient was being treated with Ceftriaxone. Workup underway for newly diagnosed metastatic cancer. CXR last PM concerning for pneumonia.  Pharmacy has been consulted for Vancomycin and Zosyn dosing.  Plan: Adjust maintenance dose of Vancomycin to 750mg  IV q24h.  Continue Zosyn 3.375g IV q8h (infuse over 4 hours) Monitor renal function (daily SCr while on above regimen), cultures, clinical course.   Height: 4' 10.5" (148.6 cm) Weight: 119 lb 0.8 oz (54 kg) IBW/kg (Calculated) : 42.05  Temp (24hrs), Avg:97.8 F (36.6 C), Min:97.5 F (36.4 C), Max:98.2 F (36.8 C)  Recent Labs  Lab 02/04/2017 2110  02/15/17 0417 02/16/17 0337 02/17/17 0448 02/18/17 0429 02/19/17 0415  WBC  --    < > 12.0* 12.8* 17.2* 15.3* 14.5*  CREATININE  --    < > 0.50 0.66 0.83 0.99 0.71  LATICACIDVEN 1.78  --   --   --   --   --   --    < > = values in this interval not displayed.    Estimated Creatinine Clearance: 51.9 mL/min (by C-G formula based on SCr of 0.71 mg/dL).    No Known Allergies  Antimicrobials this admission:  1/18 Vancomycin >> 1/18, resumed 1/22 >> 1/17 Zosyn >> 1/18, resumed 1/22 >> 1/19 Rocephin >> 1/22  Microbiology results:  1/17 BCx: NGF  1/18 UCx: NGF 1/17 Influenza panel: neg 1/18 MRSA PCR: neg 1/18 peritoneal washings: NGF   Thank you for allowing pharmacy to be a part of this patient's care.    Lindell Spar, PharmD, BCPS Pager: [phone] 02/19/2017 7:41 AM

## 2017-02-19 NOTE — Progress Notes (Signed)
START ON PATHWAY REGIMEN - Ovarian     A cycle is every 21 days:     Paclitaxel      Carboplatin   **Always confirm dose/schedule in your pharmacy ordering system**    Patient Characteristics: Newly Diagnosed, Neoadjuvant Therapy AJCC T Category: [place] AJCC N Category: NX AJCC M Category: M1b Therapeutic Status: Newly Diagnosed, Neoadjuvant Therapy AJCC 8 Stage Grouping: IVB Intent of Therapy: Non-Curative / Palliative Intent, Discussed with Patient

## 2017-02-19 NOTE — Progress Notes (Signed)
[facility]  PROGRESS NOTE  Jessica Gregory FTD:[id number] DOB: Apr 09, 1950 DOA: 02/11/2017 PCP: Jessica Moron, MD   Brief HPI:   67 y.o. female with history of DM-2 who was referred to the emergency room by her primary care practitioner for evaluation of an abnormal CT scan.  Apparently, since this past November-patient has had worsening abdominal pain and distention, a CT scan of the abdomen done in the outpatient setting showed extensive lung, liver, peritoneal and omental metastases with large amount of abscess.  She underwent paracentesis cytology back on 02/18/2017 suggestive of adenocarcinoma of unknown primary, oncology called to assist with further diagnosis and management    Subjective   Patient complains of difficulty breathing, audible wheezing.   Assessment/Plan:     1. Metastatic adenocarcinoma, with unknown primary - patient underwent diagnostic and therapeutic paracentesis on 02/14/2017, cytology was consistent with adenocarcinoma of unknown primary. Oncology was consulted, I discussed the cancer markers including CEA, CA-125, CA-19-9 have been obtained. Oncology to follow with further recommendations. 2. Acute hypoxic respiratory failure- patient is requiring 3 L of oxygen via nasal cannula, chest x-ray done yesterday showed multifocal pneumonia. Patient started on vancomycin and Zosyn. There is also element of pulmonary edema, as patient is +8 L of fluid overload. Will give 1 dose of Lasix 40 mg IV. 3. Acute pain due to metastatic adenocarcinoma- continue MS Contin 60 mg every 12 hours, when necessary morphine 2 mg every 2 hours when necessary 4. UTI- urine culture on 02/14/2017 showed no growth, Rocephin has been discontinued. 5. Hypertension- blood pressure is controlled, continue Lopressor 100 mg by mouth twice a day, when necessary hydralazine 6. Diabetes mellitus- blood glucose is reasonably well controlled, continue sliding scale insulin with NovoLog. Patient has poor  by mouth intake. 7. Hyponatremia- today sodium was 134, improved from 130. IV fluids have been discontinued    DVT prophylaxis: SCDs  Code Status: Full code  Family Communication: Discussed with patient's daughter is at bedside   Disposition Plan: Pending, improvement in respiratory status and diagnosis of metastatic adenocarcinoma   Consultants:  Oncology  Procedures:  Paracentesis on 02/14/2017  Continuous infusions . piperacillin-tazobactam (ZOSYN)  IV Stopped (02/19/17 1142)  . vancomycin        Antibiotics:   Anti-infectives (From admission, onward)   Start     Dose/Rate Route Frequency Ordered Stop   02/20/17 0800  vancomycin (VANCOCIN) 500 mg in sodium chloride 0.9 % 100 mL IVPB  Status:  Discontinued     500 mg 100 mL/hr over 60 Minutes Intravenous Every 24 hours 02/18/17 2338 02/19/17 1237   02/19/17 2200  vancomycin (VANCOCIN) IVPB 750 mg/150 ml premix     750 mg 150 mL/hr over 60 Minutes Intravenous Every 24 hours 02/19/17 1237     02/18/17 2359  piperacillin-tazobactam (ZOSYN) IVPB 3.375 g     3.375 g 12.5 mL/hr over 240 Minutes Intravenous Every 8 hours 02/18/17 2311     02/18/17 2359  vancomycin (VANCOCIN) 1,250 mg in sodium chloride 0.9 % 250 mL IVPB     1,250 mg 166.7 mL/hr over 90 Minutes Intravenous  Once 02/18/17 2311 02/19/17 0110   02/15/17 1200  vancomycin (VANCOCIN) IVPB 1000 mg/200 mL premix  Status:  Discontinued     1,000 mg 200 mL/hr over 60 Minutes Intravenous Every 36 hours 02/14/17 0303 02/14/17 1026   02/14/17 1500  cefTRIAXone (ROCEPHIN) 1 g in dextrose 5 % 50 mL IVPB  Status:  Discontinued     1 g  100 mL/hr over 30 Minutes Intravenous Every 24 hours 02/14/17 1026 02/18/17 1105   02/14/17 0600  piperacillin-tazobactam (ZOSYN) IVPB 3.375 g  Status:  Discontinued     3.375 g 12.5 mL/hr over 240 Minutes Intravenous Every 8 hours 02/14/17 0304 02/14/17 1026   02/23/2017 2330  piperacillin-tazobactam (ZOSYN) IVPB 3.375 g     3.375 g 100  mL/hr over 30 Minutes Intravenous  Once 02/07/2017 2317 02/14/17 0028   01/29/2017 2330  vancomycin (VANCOCIN) IVPB 1000 mg/200 mL premix     1,000 mg 200 mL/hr over 60 Minutes Intravenous  Once 01/28/2017 2317 02/14/17 0159       Objective   Vitals:   02/19/17 0344 02/19/17 0852 02/19/17 1130 02/19/17 1240  BP:  139/88  121/84  Pulse:  90  80  Resp:    18  Temp:    97.9 F (36.6 C)  TempSrc:    Oral  SpO2:  100% 98% 98%  Weight: 54 kg (119 lb 0.8 oz)     Height:        Intake/Output Summary (Last 24 hours) at 02/19/2017 1411 Last data filed at 02/19/2017 0600 Gross per 24 hour  Intake 1600 ml  Output -  Net 1600 ml   Filed Weights   02/16/17 0700 02/18/17 0627 02/19/17 0344  Weight: 54.5 kg (120 lb 2.4 oz) 54.1 kg (119 lb 4.3 oz) 54 kg (119 lb 0.8 oz)     Physical Examination:   Physical Exam: Eyes: No icterus, extraocular muscles intact  Mouth: Oral mucosa is moist, no lesions on palate,  Neck: Supple, no deformities, masses, or tenderness Lungs: Normal respiratory effort, bilateral wheezing Heart: Regular rate and rhythm, S1 and S2 normal, no murmurs, rubs auscultated Abdomen: BS normoactive,soft,nondistended,non-tender to palpation,no organomegaly Extremities: No pretibial edema, no erythema, no cyanosis, no clubbing Neuro : Alert and oriented to time, place and person, No focal deficits  Skin: No rashes seen on exam     Data Reviewed: I have personally reviewed following labs and imaging studies  CBG: Recent Labs  Lab 02/18/17 1136 02/18/17 1740 02/18/17 2114 02/19/17 0337 02/19/17 1308  GLUCAP 157* 173* 200* 207* 192*    CBC: Recent Labs  Lab 02/12/2017 1117  02/15/17 0417 02/16/17 0337 02/17/17 0448 02/18/17 0429 02/19/17 0415  WBC 13.6*   < > 12.0* 12.8* 17.2* 15.3* 14.5*  NEUTROABS 10.5*  --   --   --   --   --   --   HGB 14.8   < > 13.7 14.3 14.3 12.7 12.9  HCT 46.1   < > 42.5 45.1 43.5 40.5 41.3  MCV 81   < > 80.8 82.4 81.6 82.0 81.8   PLT 322   < > 301 349 406* 442* 345   < > = values in this interval not displayed.    Basic Metabolic Panel: Recent Labs  Lab 02/15/17 0417 02/16/17 0337 02/17/17 0448 02/18/17 0429 02/19/17 0415  NA 130* 136 133* 130* 134*  K 3.4* 5.5* 5.5* 5.2* 4.9  CL 100* 105 101 94* 93*  CO2 23 25 21* 26 29  GLUCOSE 196* 162* 128* 201* 220*  BUN 7 9 17  30* 28*  CREATININE 0.50 0.66 0.83 0.99 0.71  CALCIUM 7.5* 8.2* 8.4* 8.3* 8.6*  MG  --   --   --   --  2.4    Recent Results (from the past 240 hour(s))  Blood Culture (routine x 2)     Status: None  Collection Time: 02/27/2017  8:28 PM  Result Value Ref Range Status   Specimen Description BLOOD RIGHT ANTECUBITAL  Final   Special Requests   Final    BOTTLES DRAWN AEROBIC AND ANAEROBIC Blood Culture adequate volume   Culture   Final    NO GROWTH 5 DAYS Performed at [facility], [address], [place], [place] [address]    Report Status 02/18/2017 FINAL  Final  Blood Culture (routine x 2)     Status: None   Collection Time: 01/31/2017  8:33 PM  Result Value Ref Range Status   Specimen Description BLOOD RIGHT WRIST  Final   Special Requests   Final    BOTTLES DRAWN AEROBIC AND ANAEROBIC Blood Culture adequate volume   Culture   Final    NO GROWTH 5 DAYS Performed at [facility], [place] [address], [place], [place] [address]    Report Status 02/18/2017 FINAL  Final  Culture, body fluid-bottle     Status: None   Collection Time: 02/14/17  9:58 AM  Result Value Ref Range Status   Specimen Description PERITONEAL  Final   Special Requests NONE  Final   Culture   Final    NO GROWTH 5 DAYS Performed at [facility] [address] [place], [place] [address]    Report Status 02/19/2017 FINAL  Final  Gram stain     Status: None   Collection Time: 02/14/17  9:58 AM  Result Value Ref Range Status   Specimen Description PERITONEAL  Final   Special Requests NONE  Final   Gram Stain   Final    CYTOSPIN SMEAR WBC  PRESENT,BOTH PMN AND MONONUCLEAR NO ORGANISMS SEEN Performed at [facility], [place] [address], [place], [place] [address]    Report Status 02/14/2017 FINAL  Final  MRSA PCR Screening     Status: None   Collection Time: 02/14/17 11:55 AM  Result Value Ref Range Status   MRSA by PCR NEGATIVE NEGATIVE Final    Comment:        The GeneXpert MRSA Assay (FDA approved for NASAL specimens only), is one component of a comprehensive MRSA colonization surveillance program. It is not intended to diagnose MRSA infection nor to guide or monitor treatment for MRSA infections.   Culture, Urine     Status: None   Collection Time: 02/14/17  2:00 PM  Result Value Ref Range Status   Specimen Description URINE, CLEAN CATCH  Final   Special Requests NONE  Final   Culture   Final    NO GROWTH Performed at [facility], [address] [place], [place] [address]    Report Status 02/16/2017 FINAL  Final     Liver Function Tests: Recent Labs  Lab 02/07/2017 1427 02/15/2017 1724 02/14/17 0223 02/18/17 0429 02/19/17 0415  AST 26 25 23 26 31   ALT 26 25 19 20 21   ALKPHOS 102 90 71 77 83  BILITOT 0.4 0.7 1.3* 0.4 0.5  PROT 6.4 6.7 5.6* 6.2* 6.8  ALBUMIN 3.1* 2.6* 2.3* 2.3* 2.4*   Recent Labs  Lab 02/10/2017 1117 02/25/2017 1724  LIPASE 8* 14   No results for input(s): AMMONIA in the last 168 hours.  Cardiac Enzymes: No results for input(s): CKTOTAL, CKMB, CKMBINDEX, TROPONINI in the last 168 hours. BNP (last 3 results) No results for input(s): BNP in the last 8760 hours.  ProBNP (last 3 results) No results for input(s): PROBNP in the  last 8760 hours.    Studies: Dg Chest Port 1 View  Result Date: 02/18/2017 CLINICAL DATA:  Initial evaluation for acute shortness of breath with wheezing, abdominal distension. EXAM: PORTABLE CHEST 1 VIEW COMPARISON:  Prior radiograph from 02/21/2017. FINDINGS: Examination technically limited by patient positioning and shallow inspiration. Patient  markedly rotated to the right. Cardiac and mediastinal silhouettes are grossly stable and within normal limits. Lungs are hypoinflated. Extensive patchy multifocal opacities throughout the right lung as well as the left upper lobe, somewhat concerning for possible multifocal infiltrates/infection. Probable superimposed component of atelectasis at the right lung base. Suspected small layering right pleural effusion. No overt pulmonary edema. No pneumothorax. No acute osseous abnormality. Several prominent gas-filled loops of bowel partially visualize within the upper abdomen. IMPRESSION: 1. Patchy multifocal opacities involving the right lung as well as the left upper lobe, concerning for multifocal infiltrates/pneumonia. 2. Suspected small layering right pleural effusion. Electronically Signed   By: Jeannine Boga M.D.   On: 02/18/2017 22:45    Scheduled Meds: . bisacodyl  10 mg Oral Daily  . docusate sodium  200 mg Oral BID  . feeding supplement (GLUCERNA SHAKE)  237 mL Oral TID BM  . furosemide  40 mg Intravenous Once  . guaiFENesin  600 mg Oral BID  . insulin aspart  0-9 Units Subcutaneous Q6H  . ipratropium-albuterol  3 mL Nebulization TID  . metoprolol tartrate  100 mg Oral BID  . morphine  60 mg Oral Q12H  . polyethylene glycol  17 g Oral BID      Time spent: 25 min  Oswald Hillock   [facility] Pager [phone]. If 7PM-7AM, please contact night-coverage at [email or web address], Office  [phone]  password [email or web address]  02/19/2017, 2:11 PM  LOS: 5 days

## 2017-02-19 NOTE — Progress Notes (Signed)
IP PROGRESS NOTE  Subjective:  Overnight, patient had significant worsening respiratory status, but recovered after aggressive respiratory therapy and additional pain medications.  Today, appears to be resting better in the chair.  Currently no new active complaints.  Most of the meeting today was to have a thorough discussion of the diagnosis and management with the family.   Objective: Vital signs in last 24 hours: Blood pressure (!) 142/79, pulse (!) 109, temperature 99.2 F (37.3 C), temperature source Oral, resp. rate 20, height 4' 10.5" (1.486 m), weight 119 lb 0.8 oz (54 kg), SpO2 98 %.  Intake/Output from previous day: 01/22 0701 - 01/23 0700 In: 1743.8 [P.O.:250; I.V.:1343.8; IV Piggyback:150] Out: -   Physical Exam: Patient appears generally ill, somnolent likely due to recently administered morphine.  Wakes up and interacts with voice stimulation.  Appears to be answering questions appropriately in [place] HEENT: Anicteric sclera, moist mucous membranes Lungs: Decreased breath sounds bilaterally with expiratory wheezing and frequent nonproductive cough Cardiac: S1/S2, regular, no murmurs Abdomen: Distended, full, but not tense, diffusely tender.  Positive bowel sounds Lymph nodes: No palpable lymphadenopathy in the cervical, supraclavicular, axillary, or inguinal regions Neurologic: No gross focal neurological deficits Skin: No rash, petechiae, or ecchymosis. Musculoskeletal: Bilateral lower extremity swelling.   Lab Results: Recent Labs    02/18/17 0429 02/19/17 0415  WBC 15.3* 14.5*  HGB 12.7 12.9  HCT 40.5 41.3  PLT 442* 345    BMET Recent Labs    02/18/17 0429 02/19/17 0415  NA 130* 134*  K 5.2* 4.9  CL 94* 93*  CO2 26 29  GLUCOSE 201* 220*  BUN 30* 28*  CREATININE 0.99 0.71  CALCIUM 8.3* 8.6*    No results found for: CEA1  Studies/Results: Dg Chest Port 1 View  Result Date: 02/18/2017 CLINICAL DATA:  Initial evaluation for acute shortness  of breath with wheezing, abdominal distension. EXAM: PORTABLE CHEST 1 VIEW COMPARISON:  Prior radiograph from 02/11/2017. FINDINGS: Examination technically limited by patient positioning and shallow inspiration. Patient markedly rotated to the right. Cardiac and mediastinal silhouettes are grossly stable and within normal limits. Lungs are hypoinflated. Extensive patchy multifocal opacities throughout the right lung as well as the left upper lobe, somewhat concerning for possible multifocal infiltrates/infection. Probable superimposed component of atelectasis at the right lung base. Suspected small layering right pleural effusion. No overt pulmonary edema. No pneumothorax. No acute osseous abnormality. Several prominent gas-filled loops of bowel partially visualize within the upper abdomen. IMPRESSION: 1. Patchy multifocal opacities involving the right lung as well as the left upper lobe, concerning for multifocal infiltrates/pneumonia. 2. Suspected small layering right pleural effusion. Electronically Signed   By: Jeannine Boga M.D.   On: 02/18/2017 22:45    Medications: I have reviewed the patient's current medications.  Assessment/Plan: 67 year old female presenting with abdominal pain and partial bowel obstruction with findings of widely metastatic malignancy including peritoneal carcinomatosis, hepatic lesions, and multifocal bilateral pulmonary involvement.  Peritoneal fluid positive for presence of cells consistent with adenocarcinoma.  At this time, differential diagnosis includes primary peritoneal cancer in the absence of definitive ovarian mass, cholangiocarcinoma or gallbladder adenocarcinoma cannot be excluded as the largest mass in the liver may be the actual primary lesion.  Regardless of the primary source, we are dealing with widely metastatic incurable malignancy.  Patient is significantly disabled due to the burden of the disease including inability to maintain adequate nutrition and  hydration on her own.  Additionally, her respiratory status appears to be undermined, but  not yet compromised by the extensive lymphangitic spread into the lungs.  Today, I have spent approximately 90 minutes in direct conversation with the patient's family with multiple relatives present.  Presented information included current level of understanding of metastatic adenocarcinoma with pulmonary, hepatic, and peritoneal involvement, symptoms attributable to the malignancy driving the patient's current clinical state.  Subsequently, we have discussed the uncertainty of the diagnosis that we have at this time as the top 3 differential diagnosis include primary peritoneal/ovarian carcinoma, gallbladder adenocarcinoma, or an intrahepatic cholangiocarcinoma.  The staining pattern obtained is not consistent with colon cancer, radiographic findings favor abdominal primary over pulmonary primary.  Recommendations: - Cancer markers including CEA, CA-125, CA 19-9 are pending at this time -Family agrees to proceed with systemic palliative chemotherapy.  We are still awaiting updated information from pathology and the cancer marker levels which I hope to receive tomorrow. -At this time, I will start planning treatment on assumption of primary peritoneal carcinoma.  Recommend placement of PICC line tomorrow.  I will assume primary role of care tomorrow afternoon and transfer patient to the 3 W. floor where I will likely initiate systemic palliative chemotherapy with carboplatin and paclitaxel on Friday morning.     LOS: 5 days   Ardath Sax, MD   02/19/2017, 8:54 PM

## 2017-02-20 ENCOUNTER — Telehealth: Payer: Self-pay

## 2017-02-20 ENCOUNTER — Ambulatory Visit: Payer: Medicare Other | Admitting: Family Medicine

## 2017-02-20 DIAGNOSIS — Z5111 Encounter for antineoplastic chemotherapy: Secondary | ICD-10-CM

## 2017-02-20 LAB — CBC
HCT: 39.3 % (ref 36.0–46.0)
HEMOGLOBIN: 12.3 g/dL (ref 12.0–15.0)
MCH: 25.6 pg — AB (ref 26.0–34.0)
MCHC: 31.3 g/dL (ref 30.0–36.0)
MCV: 81.9 fL (ref 78.0–100.0)
Platelets: 348 10*3/uL (ref 150–400)
RBC: 4.8 MIL/uL (ref 3.87–5.11)
RDW: 13.7 % (ref 11.5–15.5)
WBC: 17.6 10*3/uL — ABNORMAL HIGH (ref 4.0–10.5)

## 2017-02-20 LAB — COMPREHENSIVE METABOLIC PANEL
ALK PHOS: 76 U/L (ref 38–126)
ALT: 36 U/L (ref 14–54)
ANION GAP: 9 (ref 5–15)
AST: 65 U/L — ABNORMAL HIGH (ref 15–41)
Albumin: 2.2 g/dL — ABNORMAL LOW (ref 3.5–5.0)
BUN: 26 mg/dL — ABNORMAL HIGH (ref 6–20)
CALCIUM: 8.4 mg/dL — AB (ref 8.9–10.3)
CO2: 31 mmol/L (ref 22–32)
CREATININE: 0.66 mg/dL (ref 0.44–1.00)
Chloride: 93 mmol/L — ABNORMAL LOW (ref 101–111)
GFR calc non Af Amer: >60 mL/min (ref >60)
Glucose, Bld: 142 mg/dL — ABNORMAL HIGH (ref 65–99)
Potassium: 4.3 mmol/L (ref 3.5–5.1)
SODIUM: 133 mmol/L — AB (ref 135–145)
TOTAL PROTEIN: 6.2 g/dL — AB (ref 6.5–8.1)
Total Bilirubin: 0.3 mg/dL (ref 0.3–1.2)

## 2017-02-20 LAB — CA 125: CANCER ANTIGEN (CA) 125: 514.1 U/mL — AB (ref 0.0–38.1)

## 2017-02-20 LAB — GLUCOSE, CAPILLARY
GLUCOSE-CAPILLARY: 208 mg/dL — AB (ref 65–99)
GLUCOSE-CAPILLARY: 144 mg/dL — AB (ref 65–99)
Glucose-Capillary: 143 mg/dL — ABNORMAL HIGH (ref 65–99)
Glucose-Capillary: 164 mg/dL — ABNORMAL HIGH (ref 65–99)

## 2017-02-20 LAB — CEA: CEA: 0.9 ng/mL (ref 0.0–4.7)

## 2017-02-20 LAB — CANCER ANTIGEN 19-9: CA 19-9: 34 U/mL (ref 0–35)

## 2017-02-20 MED ORDER — AMOXICILLIN-POT CLAVULANATE 875-125 MG PO TABS
1.0000 | ORAL_TABLET | Freq: Two times a day (BID) | ORAL | Status: DC
  Administered 2017-02-20 – 2017-02-22 (×5): 1 via ORAL
  Filled 2017-02-20 (×5): qty 1

## 2017-02-20 MED ORDER — INSULIN ASPART 100 UNIT/ML ~~LOC~~ SOLN
0.0000 [IU] | Freq: Three times a day (TID) | SUBCUTANEOUS | Status: DC
  Administered 2017-02-21: 1 [IU] via SUBCUTANEOUS
  Administered 2017-02-21 – 2017-02-22 (×3): 2 [IU] via SUBCUTANEOUS
  Administered 2017-02-22: 7 [IU] via SUBCUTANEOUS
  Administered 2017-02-23 (×3): 3 [IU] via SUBCUTANEOUS
  Administered 2017-02-24: 5 [IU] via SUBCUTANEOUS
  Administered 2017-02-24: 3 [IU] via SUBCUTANEOUS
  Administered 2017-02-25 (×2): 1 [IU] via SUBCUTANEOUS
  Administered 2017-02-25 – 2017-02-26 (×4): 2 [IU] via SUBCUTANEOUS
  Administered 2017-02-27: 5 [IU] via SUBCUTANEOUS
  Administered 2017-02-27: 1 [IU] via SUBCUTANEOUS
  Administered 2017-02-27: 2 [IU] via SUBCUTANEOUS
  Administered 2017-02-28: 1 [IU] via SUBCUTANEOUS
  Administered 2017-02-28 (×2): 2 [IU] via SUBCUTANEOUS
  Administered 2017-03-01: 3 [IU] via SUBCUTANEOUS
  Administered 2017-03-01 – 2017-03-02 (×3): 2 [IU] via SUBCUTANEOUS
  Administered 2017-03-04 – 2017-03-06 (×4): 1 [IU] via SUBCUTANEOUS
  Administered 2017-03-06: 2 [IU] via SUBCUTANEOUS
  Administered 2017-03-07 – 2017-03-09 (×4): 1 [IU] via SUBCUTANEOUS
  Administered 2017-03-11: 2 [IU] via SUBCUTANEOUS
  Administered 2017-03-11 – 2017-03-12 (×4): 1 [IU] via SUBCUTANEOUS

## 2017-02-20 MED ORDER — FUROSEMIDE 40 MG PO TABS
40.0000 mg | ORAL_TABLET | Freq: Every day | ORAL | Status: DC
  Administered 2017-02-20 – 2017-02-27 (×8): 40 mg via ORAL
  Filled 2017-02-20 (×8): qty 1

## 2017-02-20 NOTE — Progress Notes (Signed)
Report called to [facility] on 3W. Hospital course reviewed and all questions answered. Patient stable on transfer.

## 2017-02-20 NOTE — Progress Notes (Signed)
IP PROGRESS NOTE  Subjective:  No acute events overnight.  Patient was supposed to receive a PICC line earlier today, but the family changed their mind and requested a port placement.  Port was not placed as the patient's respiratory status is too unstable to undergo conscious sedation at this time and we are awaiting PICC placement at this point.   Objective: Vital signs in last 24 hours: Blood pressure 119/87, pulse 92, temperature (!) 97.4 F (36.3 C), temperature source Oral, resp. rate 20, height 4' 10.5" (1.486 m), weight 128 lb 12 oz (58.4 kg), SpO2 100 %.  Intake/Output from previous day: 01/23 0701 - 01/24 0700 In: 300 [IV Piggyback:300] Out: -   Physical Exam: Patient appears generally ill, somnolent likely due to recently administered morphine.  Wakes up and interacts with voice stimulation.  Appears to be answering questions appropriately in [place] HEENT: Anicteric sclera, moist mucous membranes Lungs: Decreased breath sounds bilaterally with expiratory wheezing and frequent nonproductive cough Cardiac: S1/S2, regular, no murmurs Abdomen: Distended, full, but not tense, diffusely tender.  Positive bowel sounds Lymph nodes: No palpable lymphadenopathy in the cervical, supraclavicular, axillary, or inguinal regions Neurologic: No gross focal neurological deficits Skin: No rash, petechiae, or ecchymosis. Musculoskeletal: Bilateral lower extremity swelling.   Lab Results: Recent Labs    02/19/17 0415 02/20/17 0400  WBC 14.5* 17.6*  HGB 12.9 12.3  HCT 41.3 39.3  PLT 345 348    BMET Recent Labs    02/19/17 0415 02/20/17 0400  NA 134* 133*  K 4.9 4.3  CL 93* 93*  CO2 29 31  GLUCOSE 220* 142*  BUN 28* 26*  CREATININE 0.71 0.66  CALCIUM 8.6* 8.4*    Lab Results  Component Value Date   CEA1 0.9 02/19/2017    Studies/Results: Dg Chest Port 1 View  Result Date: 02/18/2017 CLINICAL DATA:  Initial evaluation for acute shortness of breath with wheezing,  abdominal distension. EXAM: PORTABLE CHEST 1 VIEW COMPARISON:  Prior radiograph from 01/31/2017. FINDINGS: Examination technically limited by patient positioning and shallow inspiration. Patient markedly rotated to the right. Cardiac and mediastinal silhouettes are grossly stable and within normal limits. Lungs are hypoinflated. Extensive patchy multifocal opacities throughout the right lung as well as the left upper lobe, somewhat concerning for possible multifocal infiltrates/infection. Probable superimposed component of atelectasis at the right lung base. Suspected small layering right pleural effusion. No overt pulmonary edema. No pneumothorax. No acute osseous abnormality. Several prominent gas-filled loops of bowel partially visualize within the upper abdomen. IMPRESSION: 1. Patchy multifocal opacities involving the right lung as well as the left upper lobe, concerning for multifocal infiltrates/pneumonia. 2. Suspected small layering right pleural effusion. Electronically Signed   By: Jeannine Boga M.D.   On: 02/18/2017 22:45    Medications: I have reviewed the patient's current medications.  Assessment/Plan: 67 year old female presenting with abdominal pain and partial bowel obstruction with findings of widely metastatic malignancy including peritoneal carcinomatosis, hepatic lesions, and multifocal bilateral pulmonary involvement.  Peritoneal fluid positive for presence of cells consistent with adenocarcinoma.  At this time, differential diagnosis includes primary peritoneal cancer in the absence of definitive ovarian mass, cholangiocarcinoma or gallbladder adenocarcinoma cannot be excluded as the largest mass in the liver may be the actual primary lesion.  Regardless of the primary source, we are dealing with widely metastatic incurable malignancy.  Patient is significantly disabled due to the burden of the disease including inability to maintain adequate nutrition and hydration on her own.   Additionally, her  respiratory status appears to be undermined, but not yet compromised by the extensive lymphangitic spread into the lungs.  Additional results arrived today.  Pathology tested additional markers none of which were positive and thus do not contribute additionally to the diagnosis.  Cancer markers returned positive for elevation in CA 125, but negative for elevation in CEA or CA 19-9.  This pattern is nondiagnostic, but favors ovarian primary over biliary or gallbladder primary tumor.  Recommendations: -I will assume primary care role for the patient starting tonight, patient will be transferred to 3 W. with plans to initiate systemic palliative chemotherapy with carboplatin AUC 5 and paclitaxel 175 mg/m day 1 of 21-day cycle tomorrow. -Patient will require hydration and tumor lysis prophylaxis considering significant bulk of the disease once chemotherapy is initiated.     LOS: 6 days   Ardath Sax, MD   02/20/2017, 7:01 PM

## 2017-02-20 NOTE — Telephone Encounter (Signed)
Per Dr. Lebron Conners, patient to start chemotherapy- Carboplatin and Taxol on 02/21/17. Patient is currently on 4W and will be transferred to 3W for chemo per Dr. Lebron Conners. Spoke with Dorian Pod in the inpatient pharmacy and made her aware. Also spoke to pharmacist at [facility] and made them aware.

## 2017-02-20 NOTE — Progress Notes (Signed)
[facility]  PROGRESS NOTE  Jessica Gregory UMP:[id number] DOB: 09-30-1950 DOA: 02/20/2017 PCP: Forrest Moron, MD   Brief HPI:   67 y.o. female with history of DM-2 who was referred to the emergency room by her primary care practitioner for evaluation of an abnormal CT scan.  Apparently, since this past November-patient has had worsening abdominal pain and distention, a CT scan of the abdomen done in the outpatient setting showed extensive lung, liver, peritoneal and omental metastases with large amount of abscess.  She underwent paracentesis cytology back on 02/18/2017 suggestive of adenocarcinoma of unknown primary, oncology called to assist with further diagnosis and management    Subjective   Patient seen and examined, breathing better, plan for chemotherapy in am.   Assessment/Plan:     1. Metastatic adenocarcinoma, with unknown primary - CA 125 is elevated to 514, likely ovarian cancer, patient underwent diagnostic and therapeutic paracentesis on 02/14/2017, cytology was consistent with adenocarcinoma of unknown primary. Oncology was consulted and plan for starting chemotherapy in am. Family wants port A cath instead of picc line is is, IR consulted for port A cath placement. 2. Acute hypoxic respiratory failure- patient is requiring 3 L of oxygen via nasal cannula, chest x-ray done yesterday showed multifocal pneumonia. Patient started on vancomycin and Zosyn.  Will change the antibiotics to Augmentin.There is also element of pulmonary edema, as patient is +8 L of fluid overload.  Improved with 1 dose of Lasix 40 mg IV. Will start Lasix 40 mg po daily. 3. Acute pain due to metastatic adenocarcinoma- continue MS Contin 60 mg every 12 hours, when necessary morphine 2 mg every 2 hours when necessary 4. ? UTI- urine culture on 02/14/2017 showed no growth, Rocephin has been discontinued. 5. Hypertension- blood pressure is controlled, continue Lopressor 100 mg by mouth twice a day, when  necessary hydralazine 6. Diabetes mellitus- blood glucose is reasonably well controlled, continue sliding scale insulin with NovoLog. Patient has poor by mouth intake. 7. Hyponatremia- today sodium was 134, improved from 130. IV fluids have been discontinued    DVT prophylaxis: SCDs  Code Status: Full code  Family Communication: Discussed with patient's daughter is at bedside   Disposition Plan: Pending, improvement in respiratory status and diagnosis of metastatic adenocarcinoma   Consultants:  Oncology  Procedures:  Paracentesis on 02/14/2017  Continuous infusions . piperacillin-tazobactam (ZOSYN)  IV 3.375 g (02/20/17 0806)      Antibiotics:   Anti-infectives (From admission, onward)   Start     Dose/Rate Route Frequency Ordered Stop   02/20/17 1800  amoxicillin-clavulanate (AUGMENTIN) 875-125 MG per tablet 1 tablet     1 tablet Oral Every 12 hours 02/20/17 1037     02/20/17 0800  vancomycin (VANCOCIN) 500 mg in sodium chloride 0.9 % 100 mL IVPB  Status:  Discontinued     500 mg 100 mL/hr over 60 Minutes Intravenous Every 24 hours 02/18/17 2338 02/19/17 1237   02/19/17 2200  vancomycin (VANCOCIN) IVPB 750 mg/150 ml premix  Status:  Discontinued     750 mg 150 mL/hr over 60 Minutes Intravenous Every 24 hours 02/19/17 1237 02/20/17 1037   02/18/17 2359  piperacillin-tazobactam (ZOSYN) IVPB 3.375 g     3.375 g 12.5 mL/hr over 240 Minutes Intravenous Every 8 hours 02/18/17 2311 02/20/17 1230   02/18/17 2359  vancomycin (VANCOCIN) 1,250 mg in sodium chloride 0.9 % 250 mL IVPB     1,250 mg 166.7 mL/hr over 90 Minutes Intravenous  Once 02/18/17 2311 02/19/17  0110   02/15/17 1200  vancomycin (VANCOCIN) IVPB 1000 mg/200 mL premix  Status:  Discontinued     1,000 mg 200 mL/hr over 60 Minutes Intravenous Every 36 hours 02/14/17 0303 02/14/17 1026   02/14/17 1500  cefTRIAXone (ROCEPHIN) 1 g in dextrose 5 % 50 mL IVPB  Status:  Discontinued     1 g 100 mL/hr over 30 Minutes  Intravenous Every 24 hours 02/14/17 1026 02/18/17 1105   02/14/17 0600  piperacillin-tazobactam (ZOSYN) IVPB 3.375 g  Status:  Discontinued     3.375 g 12.5 mL/hr over 240 Minutes Intravenous Every 8 hours 02/14/17 0304 02/14/17 1026   02/10/2017 2330  piperacillin-tazobactam (ZOSYN) IVPB 3.375 g     3.375 g 100 mL/hr over 30 Minutes Intravenous  Once 02/22/2017 2317 02/14/17 0028   02/21/2017 2330  vancomycin (VANCOCIN) IVPB 1000 mg/200 mL premix     1,000 mg 200 mL/hr over 60 Minutes Intravenous  Once 02/15/2017 2317 02/14/17 0159       Objective   Vitals:   02/20/17 0447 02/20/17 0529 02/20/17 0734 02/20/17 0900  BP:  134/66  (!) 155/86  Pulse:  99  (!) 120  Resp:  18  19  Temp:  97.8 F (36.6 C)    TempSrc:  Oral    SpO2:  100% 98% 96%  Weight: 58.4 kg (128 lb 12 oz)     Height:        Intake/Output Summary (Last 24 hours) at 02/20/2017 1158 Last data filed at 02/20/2017 0600 Gross per 24 hour  Intake 300 ml  Output -  Net 300 ml   Filed Weights   02/18/17 0627 02/19/17 0344 02/20/17 0447  Weight: 54.1 kg (119 lb 4.3 oz) 54 kg (119 lb 0.8 oz) 58.4 kg (128 lb 12 oz)     Physical Examination:  Physical Exam: Eyes: No icterus, extraocular muscles intact  Mouth: Oral mucosa is moist, no lesions on palate,  Neck: Supple, no deformities, masses, or tenderness Lungs: Normal respiratory effort, bilateral clear to auscultation, no crackles or wheezes.  Heart: Regular rate and rhythm, S1 and S2 normal, no murmurs, rubs auscultated Abdomen: BS normoactive,soft,nondistended,non-tender to palpation,no organomegaly Extremities: trace edema bilaterally in lower extremities Neuro : Alert and oriented to time, place and person, No focal deficits      Data Reviewed: I have personally reviewed following labs and imaging studies  CBG: Recent Labs  Lab 02/19/17 1308 02/19/17 2132 02/19/17 2325 02/20/17 0438 02/20/17 1152  GLUCAP 192* 265* 252* 143* 208*    CBC: Recent  Labs  Lab 02/16/17 0337 02/17/17 0448 02/18/17 0429 02/19/17 0415 02/20/17 0400  WBC 12.8* 17.2* 15.3* 14.5* 17.6*  HGB 14.3 14.3 12.7 12.9 12.3  HCT 45.1 43.5 40.5 41.3 39.3  MCV 82.4 81.6 82.0 81.8 81.9  PLT 349 406* 442* 345 124    Basic Metabolic Panel: Recent Labs  Lab 02/16/17 0337 02/17/17 0448 02/18/17 0429 02/19/17 0415 02/20/17 0400  NA 136 133* 130* 134* 133*  K 5.5* 5.5* 5.2* 4.9 4.3  CL 105 101 94* 93* 93*  CO2 25 21* 26 29 31   GLUCOSE 162* 128* 201* 220* 142*  BUN 9 17 30* 28* 26*  CREATININE 0.66 0.83 0.99 0.71 0.66  CALCIUM 8.2* 8.4* 8.3* 8.6* 8.4*  MG  --   --   --  2.4  --     Recent Results (from the past 240 hour(s))  Blood Culture (routine x 2)     Status: None  Collection Time: 02/10/2017  8:28 PM  Result Value Ref Range Status   Specimen Description BLOOD RIGHT ANTECUBITAL  Final   Special Requests   Final    BOTTLES DRAWN AEROBIC AND ANAEROBIC Blood Culture adequate volume   Culture   Final    NO GROWTH 5 DAYS Performed at [facility], [address] [place], [place] [address]    Report Status 02/18/2017 FINAL  Final  Blood Culture (routine x 2)     Status: None   Collection Time: 02/12/2017  8:33 PM  Result Value Ref Range Status   Specimen Description BLOOD RIGHT WRIST  Final   Special Requests   Final    BOTTLES DRAWN AEROBIC AND ANAEROBIC Blood Culture adequate volume   Culture   Final    NO GROWTH 5 DAYS Performed at [facility], [place] [address], [place], [place] [address]    Report Status 02/18/2017 FINAL  Final  Culture, body fluid-bottle     Status: None   Collection Time: 02/14/17  9:58 AM  Result Value Ref Range Status   Specimen Description PERITONEAL  Final   Special Requests NONE  Final   Culture   Final    NO GROWTH 5 DAYS Performed at [facility] [address], [place], [place] [address]    Report Status 02/19/2017 FINAL  Final  Gram stain     Status: None   Collection Time: 02/14/17  9:58 AM   Result Value Ref Range Status   Specimen Description PERITONEAL  Final   Special Requests NONE  Final   Gram Stain   Final    CYTOSPIN SMEAR WBC PRESENT,BOTH PMN AND MONONUCLEAR NO ORGANISMS SEEN Performed at [facility], [place] [address] [place], [place] [address]    Report Status 02/14/2017 FINAL  Final  MRSA PCR Screening     Status: None   Collection Time: 02/14/17 11:55 AM  Result Value Ref Range Status   MRSA by PCR NEGATIVE NEGATIVE Final    Comment:        The GeneXpert MRSA Assay (FDA approved for NASAL specimens only), is one component of a comprehensive MRSA colonization surveillance program. It is not intended to diagnose MRSA infection nor to guide or monitor treatment for MRSA infections.   Culture, Urine     Status: None   Collection Time: 02/14/17  2:00 PM  Result Value Ref Range Status   Specimen Description URINE, CLEAN CATCH  Final   Special Requests NONE  Final   Culture   Final    NO GROWTH Performed at [facility], [address] [place], [place] [address]    Report Status 02/16/2017 FINAL  Final     Liver Function Tests: Recent Labs  Lab 02/02/2017 1724 02/14/17 0223 02/18/17 0429 02/19/17 0415 02/20/17 0400  AST 25 23 26 31  65*  ALT 25 19 20 21  36  ALKPHOS 90 71 77 83 76  BILITOT 0.7 1.3* 0.4 0.5 0.3  PROT 6.7 5.6* 6.2* 6.8 6.2*  ALBUMIN 2.6* 2.3* 2.3* 2.4* 2.2*   Recent Labs  Lab 02/20/2017 1724  LIPASE 14      Studies: Dg Chest Port 1 View  Result Date: 02/18/2017 CLINICAL DATA:  Initial evaluation for acute shortness of breath with wheezing, abdominal distension. EXAM: PORTABLE CHEST 1 VIEW COMPARISON:  Prior radiograph from 02/05/2017. FINDINGS: Examination technically limited by patient positioning and shallow inspiration. Patient markedly rotated to the right. Cardiac  and mediastinal silhouettes are grossly stable and within normal limits. Lungs are hypoinflated. Extensive patchy multifocal opacities throughout  the right lung as well as the left upper lobe, somewhat concerning for possible multifocal infiltrates/infection. Probable superimposed component of atelectasis at the right lung base. Suspected small layering right pleural effusion. No overt pulmonary edema. No pneumothorax. No acute osseous abnormality. Several prominent gas-filled loops of bowel partially visualize within the upper abdomen. IMPRESSION: 1. Patchy multifocal opacities involving the right lung as well as the left upper lobe, concerning for multifocal infiltrates/pneumonia. 2. Suspected small layering right pleural effusion. Electronically Signed   By: Jeannine Boga M.D.   On: 02/18/2017 22:45    Scheduled Meds: . amoxicillin-clavulanate  1 tablet Oral Q12H  . bisacodyl  10 mg Oral Daily  . docusate sodium  200 mg Oral BID  . feeding supplement (GLUCERNA SHAKE)  237 mL Oral TID BM  . furosemide  40 mg Oral Daily  . guaiFENesin  600 mg Oral BID  . insulin aspart  0-9 Units Subcutaneous Q6H  . ipratropium-albuterol  3 mL Nebulization TID  . metoprolol tartrate  100 mg Oral BID  . morphine  60 mg Oral Q12H  . polyethylene glycol  17 g Oral BID      Time spent: 25 min  Oswald Hillock   [facility] Pager [phone]. If 7PM-7AM, please contact night-coverage at [email or web address], Office  [phone]  password [email or web address]  02/20/2017, 11:58 AM  LOS: 6 days

## 2017-02-21 ENCOUNTER — Inpatient Hospital Stay (HOSPITAL_COMMUNITY): Payer: 59

## 2017-02-21 ENCOUNTER — Other Ambulatory Visit: Payer: Self-pay | Admitting: Hematology and Oncology

## 2017-02-21 ENCOUNTER — Encounter (HOSPITAL_COMMUNITY): Payer: Self-pay | Admitting: Radiology

## 2017-02-21 ENCOUNTER — Telehealth: Payer: Self-pay | Admitting: Family Medicine

## 2017-02-21 DIAGNOSIS — G893 Neoplasm related pain (acute) (chronic): Secondary | ICD-10-CM

## 2017-02-21 DIAGNOSIS — R110 Nausea: Secondary | ICD-10-CM

## 2017-02-21 HISTORY — PX: IR PARACENTESIS: IMG2679

## 2017-02-21 LAB — GLUCOSE, CAPILLARY
GLUCOSE-CAPILLARY: 191 mg/dL — AB (ref 65–99)
GLUCOSE-CAPILLARY: 162 mg/dL — AB (ref 65–99)
Glucose-Capillary: 148 mg/dL — ABNORMAL HIGH (ref 65–99)

## 2017-02-21 LAB — CBC WITH DIFFERENTIAL/PLATELET
Basophils Absolute: 0 10*3/uL (ref 0.0–0.1)
Basophils Relative: 0 %
EOS PCT: 0 %
Eosinophils Absolute: 0 10*3/uL (ref 0.0–0.7)
HEMATOCRIT: 41.9 % (ref 36.0–46.0)
HEMOGLOBIN: 13.4 g/dL (ref 12.0–15.0)
LYMPHS ABS: 1.2 10*3/uL (ref 0.7–4.0)
LYMPHS PCT: 7 %
MCH: 26.2 pg (ref 26.0–34.0)
MCHC: 32 g/dL (ref 30.0–36.0)
MCV: 81.8 fL (ref 78.0–100.0)
MONOS PCT: 11 %
Monocytes Absolute: 1.9 10*3/uL — ABNORMAL HIGH (ref 0.1–1.0)
Neutro Abs: 14 10*3/uL — ABNORMAL HIGH (ref 1.7–7.7)
Neutrophils Relative %: 82 %
Platelets: 395 10*3/uL (ref 150–400)
RBC: 5.12 MIL/uL — AB (ref 3.87–5.11)
RDW: 13.8 % (ref 11.5–15.5)
WBC: 17.1 10*3/uL — ABNORMAL HIGH (ref 4.0–10.5)

## 2017-02-21 LAB — COMPREHENSIVE METABOLIC PANEL
ALK PHOS: 87 U/L (ref 38–126)
ALT: 35 U/L (ref 14–54)
AST: 40 U/L (ref 15–41)
Albumin: 2.4 g/dL — ABNORMAL LOW (ref 3.5–5.0)
Anion gap: 12 (ref 5–15)
BILIRUBIN TOTAL: 0.5 mg/dL (ref 0.3–1.2)
BUN: 30 mg/dL — AB (ref 6–20)
CALCIUM: 9.1 mg/dL (ref 8.9–10.3)
CO2: 31 mmol/L (ref 22–32)
CREATININE: 0.82 mg/dL (ref 0.44–1.00)
Chloride: 87 mmol/L — ABNORMAL LOW (ref 101–111)
GFR calc Af Amer: >60 mL/min (ref >60)
GFR calc non Af Amer: >60 mL/min (ref >60)
GLUCOSE: 178 mg/dL — AB (ref 65–99)
Potassium: 4.8 mmol/L (ref 3.5–5.1)
Sodium: 130 mmol/L — ABNORMAL LOW (ref 135–145)
TOTAL PROTEIN: 6.8 g/dL (ref 6.5–8.1)

## 2017-02-21 LAB — MAGNESIUM: Magnesium: 2.3 mg/dL (ref 1.7–2.4)

## 2017-02-21 LAB — URIC ACID: Uric Acid, Serum: 3.7 mg/dL (ref 2.3–6.6)

## 2017-02-21 LAB — PHOSPHORUS: Phosphorus: 4 mg/dL (ref 2.5–4.6)

## 2017-02-21 MED ORDER — HEPARIN SOD (PORK) LOCK FLUSH 100 UNIT/ML IV SOLN: 250.0000 [IU] | Freq: Once | INTRAVENOUS | Status: DC | PRN

## 2017-02-21 MED ORDER — MORPHINE SULFATE (PF) 2 MG/ML IV SOLN
2.0000 mg | INTRAVENOUS | Status: DC | PRN
  Administered 2017-02-22 – 2017-02-25 (×7): 2 mg via INTRAVENOUS
  Filled 2017-02-21: qty 1
  Filled 2017-02-21: qty 2
  Filled 2017-02-21 (×5): qty 1

## 2017-02-21 MED ORDER — PALONOSETRON HCL INJECTION 0.25 MG/5ML
0.2500 mg | Freq: Once | INTRAVENOUS | Status: AC
  Administered 2017-02-21: 0.25 mg via INTRAVENOUS
  Filled 2017-02-21: qty 5

## 2017-02-21 MED ORDER — EPINEPHRINE PF 1 MG/10ML IJ SOSY: 0.2500 mg | PREFILLED_SYRINGE | Freq: Once | INTRAMUSCULAR | Status: DC | PRN

## 2017-02-21 MED ORDER — ALBUMIN HUMAN 25 % IV SOLN
25.0000 g | Freq: Once | INTRAVENOUS | Status: AC
  Administered 2017-02-22: 25 g via INTRAVENOUS
  Filled 2017-02-21 (×2): qty 100

## 2017-02-21 MED ORDER — FAMOTIDINE IN NACL 20-0.9 MG/50ML-% IV SOLN
20.0000 mg | Freq: Once | INTRAVENOUS | Status: AC
  Administered 2017-02-21: 20 mg via INTRAVENOUS
  Filled 2017-02-21 (×3): qty 50

## 2017-02-21 MED ORDER — SODIUM CHLORIDE 0.9% FLUSH
10.0000 mL | INTRAVENOUS | Status: DC | PRN
  Administered 2017-03-17: 10 mL
  Filled 2017-02-21: qty 40

## 2017-02-21 MED ORDER — METHYLPREDNISOLONE SODIUM SUCC 125 MG IJ SOLR: 125.0000 mg | Freq: Once | INTRAMUSCULAR | Status: DC | PRN

## 2017-02-21 MED ORDER — LIDOCAINE HCL 1 % IJ SOLN
INTRAMUSCULAR | Status: AC
  Filled 2017-02-21: qty 20

## 2017-02-21 MED ORDER — SODIUM CHLORIDE 0.9 % IV SOLN
20.0000 mg | Freq: Once | INTRAVENOUS | Status: AC
  Administered 2017-02-21: 20 mg via INTRAVENOUS
  Filled 2017-02-21: qty 2

## 2017-02-21 MED ORDER — SODIUM CHLORIDE 0.9 % IV SOLN
8.0000 mg | Freq: Three times a day (TID) | INTRAVENOUS | Status: DC | PRN
  Filled 2017-02-21: qty 4

## 2017-02-21 MED ORDER — ALBUTEROL SULFATE (2.5 MG/3ML) 0.083% IN NEBU: 2.5000 mg | INHALATION_SOLUTION | Freq: Once | RESPIRATORY_TRACT | Status: DC | PRN

## 2017-02-21 MED ORDER — DIPHENHYDRAMINE HCL 50 MG/ML IJ SOLN
50.0000 mg | Freq: Once | INTRAMUSCULAR | Status: AC
  Administered 2017-02-21: 50 mg via INTRAVENOUS
  Filled 2017-02-21: qty 1

## 2017-02-21 MED ORDER — SODIUM CHLORIDE 0.9% FLUSH
10.0000 mL | Freq: Two times a day (BID) | INTRAVENOUS | Status: DC
  Administered 2017-02-22 – 2017-03-14 (×20): 10 mL
  Administered 2017-03-14: 20 mL
  Administered 2017-03-16 – 2017-03-17 (×3): 10 mL

## 2017-02-21 MED ORDER — PANTOPRAZOLE SODIUM 40 MG IV SOLR
40.0000 mg | INTRAVENOUS | Status: DC
  Administered 2017-02-21 – 2017-02-23 (×3): 40 mg via INTRAVENOUS
  Filled 2017-02-21 (×3): qty 40

## 2017-02-21 MED ORDER — SODIUM CHLORIDE 0.9% FLUSH: 10.0000 mL | INTRAVENOUS | Status: DC | PRN

## 2017-02-21 MED ORDER — ALTEPLASE 2 MG IJ SOLR: 2.0000 mg | Freq: Once | INTRAMUSCULAR | Status: DC | PRN

## 2017-02-21 MED ORDER — FAMOTIDINE IN NACL 20-0.9 MG/50ML-% IV SOLN
20.0000 mg | Freq: Once | INTRAVENOUS | Status: DC | PRN
  Filled 2017-02-21: qty 50

## 2017-02-21 MED ORDER — COLD PACK MISC ONCOLOGY
1.0000 | Freq: Once | Status: AC | PRN
  Filled 2017-02-21: qty 1

## 2017-02-21 MED ORDER — PACLITAXEL CHEMO INJECTION 300 MG/50ML
175.0000 mg/m2 | Freq: Once | INTRAVENOUS | Status: AC
  Administered 2017-02-21: 258 mg via INTRAVENOUS
  Filled 2017-02-21: qty 43

## 2017-02-21 MED ORDER — MORPHINE SULFATE 2 MG/ML IJ SOLN: 2.0000 mg | INTRAMUSCULAR | Status: DC | PRN

## 2017-02-21 MED ORDER — LIDOCAINE HCL 1 % IJ SOLN
INTRAMUSCULAR | Status: AC | PRN
  Administered 2017-02-21: 10 mL

## 2017-02-21 MED ORDER — LORAZEPAM 2 MG/ML IJ SOLN
0.5000 mg | Freq: Four times a day (QID) | INTRAMUSCULAR | Status: DC | PRN
  Administered 2017-03-02 – 2017-03-16 (×4): 0.5 mg via INTRAVENOUS
  Filled 2017-02-21 (×4): qty 1

## 2017-02-21 MED ORDER — SODIUM CHLORIDE 0.9 % IV SOLN
Freq: Once | INTRAVENOUS | Status: AC | PRN
  Administered 2017-02-21: 16:00:00 via INTRAVENOUS

## 2017-02-21 MED ORDER — HEPARIN SOD (PORK) LOCK FLUSH 100 UNIT/ML IV SOLN: 500.0000 [IU] | Freq: Once | INTRAVENOUS | Status: DC | PRN

## 2017-02-21 MED ORDER — SODIUM CHLORIDE 0.9% FLUSH: 3.0000 mL | INTRAVENOUS | Status: DC | PRN

## 2017-02-21 MED ORDER — DIPHENHYDRAMINE HCL 50 MG/ML IJ SOLN: 50.0000 mg | Freq: Once | INTRAMUSCULAR | Status: DC | PRN

## 2017-02-21 MED ORDER — EPINEPHRINE PF 1 MG/ML IJ SOLN
0.5000 mg | Freq: Once | INTRAMUSCULAR | Status: DC | PRN
  Filled 2017-02-21: qty 1

## 2017-02-21 MED ORDER — DIPHENHYDRAMINE HCL 50 MG/ML IJ SOLN: 25.0000 mg | Freq: Once | INTRAMUSCULAR | Status: DC | PRN

## 2017-02-21 MED ORDER — SODIUM CHLORIDE 0.9 % IV SOLN
Freq: Once | INTRAVENOUS | Status: AC
  Administered 2017-02-21: 16:00:00 via INTRAVENOUS

## 2017-02-21 MED ORDER — SODIUM CHLORIDE 0.9 % IV SOLN
364.0000 mg | Freq: Once | INTRAVENOUS | Status: AC
  Administered 2017-02-21: 360 mg via INTRAVENOUS
  Filled 2017-02-21: qty 36

## 2017-02-21 MED ORDER — DEXAMETHASONE SODIUM PHOSPHATE 4 MG/ML IJ SOLN
12.0000 mg | Freq: Two times a day (BID) | INTRAMUSCULAR | Status: AC
  Administered 2017-02-22 – 2017-02-23 (×4): 12 mg via INTRAVENOUS
  Filled 2017-02-21 (×4): qty 3

## 2017-02-21 MED ORDER — PROCHLORPERAZINE EDISYLATE 5 MG/ML IJ SOLN
10.0000 mg | Freq: Four times a day (QID) | INTRAMUSCULAR | Status: DC | PRN
  Administered 2017-02-27 – 2017-03-10 (×6): 10 mg via INTRAVENOUS
  Filled 2017-02-21 (×8): qty 2

## 2017-02-21 NOTE — Sedation Documentation (Signed)
1.85L fluid removed

## 2017-02-21 NOTE — Telephone Encounter (Signed)
Reconsideration request form for pt's CT Abdomen and Pelvis performed on 02/18/2017 that did not receive prior auth before performed, has been sent to [facility] at fax number [phone]. I was advised to send this once claim from [facility] is submitted. This has been faxed on 1/25.

## 2017-02-21 NOTE — Progress Notes (Signed)
Calculations and dosage of paclitaxel and carboplatin verified by Reyne Dumas, RN

## 2017-02-21 NOTE — Progress Notes (Signed)
IP PROGRESS NOTE  Subjective:  No acute events overnight.  Patient was supposed to receive a PICC line earlier today, but the family changed their mind and requested a port placement.  Port was not placed as the patient's respiratory status is too unstable to undergo conscious sedation at this time and we are awaiting PICC placement at this point.   Objective: Vital signs in last 24 hours: Blood pressure 130/80, pulse 92, temperature 98.4 F (36.9 C), temperature source Oral, resp. rate 20, height 4' 10.5" (1.486 m), weight 125 lb 9.6 oz (57 kg), SpO2 93 %.  Intake/Output from previous day: 01/24 0701 - 01/25 0700 In: 360 [P.O.:360] Out: -   Physical Exam: Patient appears generally ill, somnolent likely due to recently administered morphine.  Wakes up and interacts with voice stimulation.  Appears to be answering questions appropriately in [place] HEENT: Anicteric sclera, moist mucous membranes Lungs: Decreased breath sounds bilaterally with expiratory wheezing and frequent nonproductive cough Cardiac: S1/S2, regular, no murmurs Abdomen: Distended, full, but not tense, diffusely tender.  Positive bowel sounds Lymph nodes: No palpable lymphadenopathy in the cervical, supraclavicular, axillary, or inguinal regions Neurologic: No gross focal neurological deficits Skin: No rash, petechiae, or ecchymosis. Musculoskeletal: Bilateral lower extremity swelling.   Lab Results: Recent Labs    02/20/17 0400 02/21/17 0444  WBC 17.6* 17.1*  HGB 12.3 13.4  HCT 39.3 41.9  PLT 348 395    BMET Recent Labs    02/20/17 0400 02/21/17 0444  NA 133* 130*  K 4.3 4.8  CL 93* 87*  CO2 31 31  GLUCOSE 142* 178*  BUN 26* 30*  CREATININE 0.66 0.82  CALCIUM 8.4* 9.1    Lab Results  Component Value Date   CEA1 0.9 02/19/2017    Studies/Results: No results found.  Medications: I have reviewed the patient's current medications.  Assessment/Plan: 67 year old female who presented with  abdominal pain and partial bowel obstruction with findings of widely metastatic malignancy including peritoneal carcinomatosis, hepatic lesions, and multifocal bilateral pulmonary involvement.  Peritoneal fluid positive for presence of cells consistent with adenocarcinoma.    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  Nevertheless, distribution of the disease as well as elevation of CA 125 and normal CA19-9 with noncontributory immunohistochemical profile favor primary peritoneal or ovarian etiology. --PICC line today --Start carboplatin AUC 5 + paclitaxel 125 mg/m every 3 weeks today. --Due to disease burden, will initiate monitoring for tumor lysis every 12 hours.  For now, leaving off allopurinol, but will initiate if significant hyperuricemia is noted.  In case of progressive hyperuricemia or increasing creatinine, will use rasburicase.  **Acute hypoxic respiratory failure: Patient is currently requiring oxygen supplementation to keep saturations adequate.  Differential includes pulmonary infection versus more likely lymphangitic infiltration by the underlying malignancy with multifocal pulmonary disease demonstrated by CT of the chest.  Most likely the improvement will occur if tumor response to chemotherapy. --Continue oxygen --Continue antibiotics for now with Augmentin to complete a 10-day treatment. --Will assess possible relief after additional therapeutic paracentesis.  **Recurrent malignant ascites: Due to underlying malignancy.  Will reaccumulate rapidly until the cancer is addressed. --Repeat therapeutic paracentesis by interventional radiology.  Remove as much volume as possible with albumin replacement  **Cancer-associated pain: Patient seems to be able to stay awake enough at this current medication levels with no  uncontrolled pain. --Current pain regimen:      --  MS Contin 60mg  PO Q12 hrs    --Hydrocodone/APAP 5/325mg  1-2 tab PO Q4hrs PRN --Will replace short-acting component with intravenous morphine while hospitalized and will eventually transition to morphine IR on discharge.  **Diabetes mellitus: Control improved after initiation of sliding scale.  Most likely, control will get worse once we have initiate systemic chemotherapy as it comes with glucocorticoid administration for antiemesis.  Patient may require additional treatment at that time. --Continue SSI for now  **Hyponatremia: Complex hyponatremia due to intravascular volume depletion, pain, nausea, metastatic disease in the liver. --Continue volume supplementation carefully due to potential to exacerbate ascites --We will monitor electrolytes closely as we initiate systemic therapy.  **PPx:  --Start pantoprazole for stress ulcer prophylaxis --Holding enoxaparin ppx for now due to need for repeat paracentesis, using mechanical DVT ppx  **Code Status: FULL CODE  **Disposition: Patient continues to remain unstable with multiple problems and continues to require ongoing hospitalization.  Will need to stay hospitalized throughout weekend for monitoring for possible tumor lysis development.  Next week, if the pain is well controlled, respiratory status improves, and patient is able to tolerate oral nutrition, will discharge home for outpatient follow-up in the cancer center.      LOS: 7 days   Ardath Sax, MD   02/21/2017, 9:00 AM

## 2017-02-21 NOTE — Progress Notes (Signed)
Teaching of Taxol and carboplatin given to patient and her daughter at the bedside. They all verbalized understanding.

## 2017-02-21 NOTE — Procedures (Signed)
Ultrasound-guided  therapeutic paracentesis performed yielding 1.9 liters of yellow fluid. No immediate complications.

## 2017-02-21 NOTE — Progress Notes (Signed)
Peripherally Inserted Central Catheter/Midline Placement  The IV Nurse has discussed with the patient and/or persons authorized to consent for the patient, the purpose of this procedure and the potential benefits and risks involved with this procedure.  The benefits include less needle sticks, lab draws from the catheter, and the patient may be discharged home with the catheter. Risks include, but not limited to, infection, bleeding, blood clot (thrombus formation), and puncture of an artery; nerve damage and irregular heartbeat and possibility to perform a PICC exchange if needed/ordered by physician.  Alternatives to this procedure were also discussed.  Bard Power PICC patient education guide, fact sheet on infection prevention and patient information card has been provided to patient /or left at bedside.    PICC/Midline Placement Documentation  PICC Single Lumen 02/21/17 PICC Left Brachial 39 cm 2 cm (Active)  Indication for Insertion or Continuance of Line Prolonged intravenous therapies 02/21/2017 11:00 AM  Exposed Catheter (cm) 2 cm 02/21/2017 11:00 AM  Dressing Change Due 02/28/17 02/21/2017 11:00 AM     Consent signed by daughter H'[name] Fetterly  Catalina Pizza 02/21/2017, 11:19 AM

## 2017-02-21 NOTE — Progress Notes (Signed)
MD wanted patient to have paracentesis done before chemo.Patient  went down for paracentesis around 1500 ,at that time chemo had already been mixed and brought to the floor.

## 2017-02-22 DIAGNOSIS — E119 Type 2 diabetes mellitus without complications: Secondary | ICD-10-CM

## 2017-02-22 DIAGNOSIS — J9690 Respiratory failure, unspecified, unspecified whether with hypoxia or hypercapnia: Secondary | ICD-10-CM

## 2017-02-22 DIAGNOSIS — Z5111 Encounter for antineoplastic chemotherapy: Secondary | ICD-10-CM

## 2017-02-22 DIAGNOSIS — G893 Neoplasm related pain (acute) (chronic): Secondary | ICD-10-CM

## 2017-02-22 LAB — CBC WITH DIFFERENTIAL/PLATELET
Basophils Absolute: 0 10*3/uL (ref 0.0–0.1)
Basophils Relative: 0 %
EOS PCT: 0 %
Eosinophils Absolute: 0 10*3/uL (ref 0.0–0.7)
HCT: 34.3 % — ABNORMAL LOW (ref 36.0–46.0)
Hemoglobin: 10.8 g/dL — ABNORMAL LOW (ref 12.0–15.0)
LYMPHS ABS: 0.4 10*3/uL — AB (ref 0.7–4.0)
Lymphocytes Relative: 3 %
MCH: 25.8 pg — AB (ref 26.0–34.0)
MCHC: 31.5 g/dL (ref 30.0–36.0)
MCV: 81.9 fL (ref 78.0–100.0)
MONO ABS: 0.4 10*3/uL (ref 0.1–1.0)
MONOS PCT: 3 %
Neutro Abs: 11.9 10*3/uL — ABNORMAL HIGH (ref 1.7–7.7)
Neutrophils Relative %: 94 %
PLATELETS: 295 10*3/uL (ref 150–400)
RBC: 4.19 MIL/uL (ref 3.87–5.11)
RDW: 14.1 % (ref 11.5–15.5)
WBC: 12.6 10*3/uL — AB (ref 4.0–10.5)

## 2017-02-22 LAB — COMPREHENSIVE METABOLIC PANEL
ALT: 20 U/L (ref 14–54)
ANION GAP: 8 (ref 5–15)
AST: 21 U/L (ref 15–41)
Albumin: 2.7 g/dL — ABNORMAL LOW (ref 3.5–5.0)
Alkaline Phosphatase: 62 U/L (ref 38–126)
BUN: 31 mg/dL — ABNORMAL HIGH (ref 6–20)
CHLORIDE: 86 mmol/L — AB (ref 101–111)
CO2: 32 mmol/L (ref 22–32)
CREATININE: 0.71 mg/dL (ref 0.44–1.00)
Calcium: 7.8 mg/dL — ABNORMAL LOW (ref 8.9–10.3)
Glucose, Bld: 217 mg/dL — ABNORMAL HIGH (ref 65–99)
Potassium: 3.9 mmol/L (ref 3.5–5.1)
Sodium: 126 mmol/L — ABNORMAL LOW (ref 135–145)
Total Bilirubin: 0.8 mg/dL (ref 0.3–1.2)
Total Protein: 5.8 g/dL — ABNORMAL LOW (ref 6.5–8.1)

## 2017-02-22 LAB — URIC ACID
URIC ACID, SERUM: 4.7 mg/dL (ref 2.3–6.6)
URIC ACID, SERUM: 5.3 mg/dL (ref 2.3–6.6)
URIC ACID, SERUM: 5.9 mg/dL (ref 2.3–6.6)

## 2017-02-22 LAB — GLUCOSE, CAPILLARY
Glucose-Capillary: 185 mg/dL — ABNORMAL HIGH (ref 65–99)
Glucose-Capillary: 200 mg/dL — ABNORMAL HIGH (ref 65–99)
Glucose-Capillary: 189 mg/dL — ABNORMAL HIGH (ref 65–99)
Glucose-Capillary: 336 mg/dL — ABNORMAL HIGH (ref 65–99)
Glucose-Capillary: 108 mg/dL — ABNORMAL HIGH (ref 65–99)

## 2017-02-22 LAB — MAGNESIUM
MAGNESIUM: 2 mg/dL (ref 1.7–2.4)
MAGNESIUM: 2.1 mg/dL (ref 1.7–2.4)
Magnesium: 2.2 mg/dL (ref 1.7–2.4)

## 2017-02-22 LAB — PHOSPHORUS
PHOSPHORUS: 3.5 mg/dL (ref 2.5–4.6)
PHOSPHORUS: 2.1 mg/dL — AB (ref 2.5–4.6)
Phosphorus: 3.6 mg/dL (ref 2.5–4.6)

## 2017-02-22 LAB — POTASSIUM: POTASSIUM: 4.2 mmol/L (ref 3.5–5.1)

## 2017-02-22 MED ORDER — IPRATROPIUM-ALBUTEROL 0.5-2.5 (3) MG/3ML IN SOLN
3.0000 mL | Freq: Two times a day (BID) | RESPIRATORY_TRACT | Status: DC
  Administered 2017-02-22 – 2017-02-25 (×6): 3 mL via RESPIRATORY_TRACT
  Filled 2017-02-22 (×6): qty 3

## 2017-02-22 NOTE — Progress Notes (Signed)
IP PROGRESS NOTE  Subjective:   Jessica Gregory reports tightness at the throat.  No nausea.  Her daughter is at the bedside.  She requests a regular diet.  Objective: Vital signs in last 24 hours: Blood pressure (!) 156/80, pulse 88, temperature 98 F (36.7 C), temperature source Oral, resp. rate 14, height 4' 10.5" (1.486 m), weight 125 lb 10.6 oz (57 kg), SpO2 98 %.  Intake/Output from previous day: No intake/output data recorded.  Physical Exam:  HEENT: No thrush Lungs: Bilateral expiratory wheeze, decreased breath sounds at the lower chest, no respiratory distress Cardiac: Regular rate and rhythm Abdomen: Distended with ascites Extremities: Pitting edema at the lower leg bilaterally   Portacath/PICC-without erythema  Lab Results: Recent Labs    02/21/17 0444 02/22/17 0530  WBC 17.1* 12.6*  HGB 13.4 10.8*  HCT 41.9 34.3*  PLT 395 295    BMET Recent Labs    02/21/17 0444 02/22/17 0020 02/22/17 0530  NA 130*  --  126*  K 4.8 4.2 3.9  CL 87*  --  86*  CO2 31  --  32  GLUCOSE 178*  --  217*  BUN 30*  --  31*  CREATININE 0.82  --  0.71  CALCIUM 9.1  --  7.8*    Lab Results  Component Value Date   CEA1 0.9 02/19/2017    Studies/Results: Ir Paracentesis  Result Date: 02/21/2017 INDICATION: Metastatic adenocarcinoma, 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 Request made for therapeutic paracentesis. EXAM: ULTRASOUND GUIDED THERAPEUTIC PARACENTESIS MEDICATIONS: None. COMPLICATIONS: None immediate. PROCEDURE: Informed written consent was obtained from the patient after a discussion of the risks, benefits and alternatives to treatment. A timeout was performed prior to the initiation of the procedure. Initial ultrasound scanning demonstrates a small to moderate amount of ascites within the left mid to lower abdominal quadrant. The left mid to lower abdomen was prepped and draped in the usual sterile fashion. 1% lidocaine was used for local  anesthesia. Following this, a Yueh catheter was introduced. An ultrasound image was saved for documentation purposes. The paracentesis was performed. The catheter was removed and a dressing was applied. The patient tolerated the procedure well without immediate post procedural complication. FINDINGS: A total of approximately 1.9 liters of yellow fluid was removed. IMPRESSION: Successful ultrasound-guided therapeutic paracentesis yielding 1.9 liters of peritoneal fluid. Read by: Rowe Robert, PA-C Electronically Signed   By: Aletta Edouard M.D.   On: 02/21/2017 15:26    Medications: I have reviewed the patient's current medications.  Assessment/Plan:  1.  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  Respiratory failure secondary to #1, continue supportive care  3.  Pain secondary to #1  4.  Diabetes  Jessica Gregory tolerated the first cycle of chemotherapy without significant acute toxicity.  She remains symptomatic from the metastatic carcinoma.  She is maintained on empiric antibiotics and Decadron for the respiratory failure.  She had a bowel movement yesterday.  She requests a regular diet.  Oncology will continue following her in the hospital and outpatient follow-up will be scheduled.   LOS: 8 days   Betsy Coder, MD   02/22/2017, 9:52 AM

## 2017-02-23 DIAGNOSIS — R1310 Dysphagia, unspecified: Secondary | ICD-10-CM

## 2017-02-23 LAB — CBC WITH DIFFERENTIAL/PLATELET
Basophils Absolute: 0 10*3/uL (ref 0.0–0.1)
Basophils Relative: 0 %
Eosinophils Absolute: 0 10*3/uL (ref 0.0–0.7)
Eosinophils Relative: 0 %
HEMATOCRIT: 36.7 % (ref 36.0–46.0)
HEMOGLOBIN: 11.6 g/dL — AB (ref 12.0–15.0)
LYMPHS ABS: 0.2 10*3/uL — AB (ref 0.7–4.0)
LYMPHS PCT: 2 %
MCH: 26.1 pg (ref 26.0–34.0)
MCHC: 31.6 g/dL (ref 30.0–36.0)
MCV: 82.5 fL (ref 78.0–100.0)
MONO ABS: 0.2 10*3/uL (ref 0.1–1.0)
MONOS PCT: 1 %
NEUTROS ABS: 16 10*3/uL — AB (ref 1.7–7.7)
Neutrophils Relative %: 97 %
Platelets: 248 10*3/uL (ref 150–400)
RBC: 4.45 MIL/uL (ref 3.87–5.11)
RDW: 13.8 % (ref 11.5–15.5)
WBC: 16.5 10*3/uL — ABNORMAL HIGH (ref 4.0–10.5)

## 2017-02-23 LAB — COMPREHENSIVE METABOLIC PANEL
ALBUMIN: 2.3 g/dL — AB (ref 3.5–5.0)
ALK PHOS: 66 U/L (ref 38–126)
ALT: 26 U/L (ref 14–54)
ANION GAP: 11 (ref 5–15)
AST: 38 U/L (ref 15–41)
BILIRUBIN TOTAL: 0.8 mg/dL (ref 0.3–1.2)
BUN: 27 mg/dL — ABNORMAL HIGH (ref 6–20)
CALCIUM: 8.2 mg/dL — AB (ref 8.9–10.3)
CO2: 32 mmol/L (ref 22–32)
Chloride: 92 mmol/L — ABNORMAL LOW (ref 101–111)
Creatinine, Ser: 0.58 mg/dL (ref 0.44–1.00)
GFR calc Af Amer: >60 mL/min (ref >60)
GLUCOSE: 216 mg/dL — AB (ref 65–99)
POTASSIUM: 3.9 mmol/L (ref 3.5–5.1)
Sodium: 135 mmol/L (ref 135–145)
TOTAL PROTEIN: 5.7 g/dL — AB (ref 6.5–8.1)

## 2017-02-23 LAB — GLUCOSE, CAPILLARY
GLUCOSE-CAPILLARY: 237 mg/dL — AB (ref 65–99)
Glucose-Capillary: 233 mg/dL — ABNORMAL HIGH (ref 65–99)
Glucose-Capillary: 232 mg/dL — ABNORMAL HIGH (ref 65–99)
Glucose-Capillary: 195 mg/dL — ABNORMAL HIGH (ref 65–99)

## 2017-02-23 LAB — MAGNESIUM
MAGNESIUM: 2.2 mg/dL (ref 1.7–2.4)
MAGNESIUM: 1.9 mg/dL (ref 1.7–2.4)

## 2017-02-23 LAB — URIC ACID
Uric Acid, Serum: 5.5 mg/dL (ref 2.3–6.6)
Uric Acid, Serum: 5.5 mg/dL (ref 2.3–6.6)

## 2017-02-23 LAB — PHOSPHORUS
PHOSPHORUS: 2.1 mg/dL — AB (ref 2.5–4.6)
Phosphorus: 2.2 mg/dL — ABNORMAL LOW (ref 2.5–4.6)

## 2017-02-23 MED ORDER — AMOXICILLIN-POT CLAVULANATE 250-62.5 MG/5ML PO SUSR
500.0000 mg | Freq: Three times a day (TID) | ORAL | Status: AC
  Administered 2017-02-23 – 2017-02-25 (×6): 500 mg via ORAL
  Filled 2017-02-23 (×6): qty 10

## 2017-02-23 MED ORDER — POLYETHYLENE GLYCOL 3350 17 G PO PACK: 17.0000 g | PACK | Freq: Every day | ORAL | Status: DC | PRN

## 2017-02-23 NOTE — Progress Notes (Signed)
IP PROGRESS NOTE  Subjective:   Jessica Gregory is up in the chair this morning.  She had bowel movements yesterday.  She complains of abdominal pain and shortness of breath.  She has difficulty swallowing pills.  Her daughter reports Jessica Gregory has been hallucinating from the morphine.  She would like another palliative paracentesis  Objective: Vital signs in last 24 hours: Blood pressure (!) 145/70, pulse (!) 109, temperature 98.8 F (37.1 C), temperature source Oral, resp. rate 15, height 4' 10.5" (1.486 m), weight 125 lb 10.6 oz (57 kg), SpO2 96 %.  Intake/Output from previous day: 01/26 0701 - 01/27 0700 In: 240 [P.O.:240] Out: -   Physical Exam:  HEENT: No thrush Lungs: Distant breath sounds, decreased wheezing Cardiac: Regular rate and rhythm Abdomen: Distended Extremities: Pitting edema at the lower leg bilaterally    Lab Results: Recent Labs    02/22/17 0530 02/23/17 0430  WBC 12.6* 16.5*  HGB 10.8* 11.6*  HCT 34.3* 36.7  PLT 295 248    BMET Recent Labs    02/22/17 0530 02/23/17 0430  NA 126* 135  K 3.9 3.9  CL 86* 92*  CO2 32 32  GLUCOSE 217* 216*  BUN 31* 27*  CREATININE 0.71 0.58  CALCIUM 7.8* 8.2*    Lab Results  Component Value Date   CEA1 0.9 02/19/2017    Studies/Results: Ir Paracentesis  Result Date: 02/21/2017 INDICATION: Metastatic adenocarcinoma, 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 Request made for therapeutic paracentesis. EXAM: ULTRASOUND GUIDED THERAPEUTIC PARACENTESIS MEDICATIONS: None. COMPLICATIONS: None immediate. PROCEDURE: Informed written consent was obtained from the patient after a discussion of the risks, benefits and alternatives to treatment. A timeout was performed prior to the initiation of the procedure. Initial ultrasound scanning demonstrates a small to moderate amount of ascites within the left mid to lower abdominal quadrant. The left mid to lower abdomen was prepped and draped in the usual  sterile fashion. 1% lidocaine was used for local anesthesia. Following this, a Yueh catheter was introduced. An ultrasound image was saved for documentation purposes. The paracentesis was performed. The catheter was removed and a dressing was applied. The patient tolerated the procedure well without immediate post procedural complication. FINDINGS: A total of approximately 1.9 liters of yellow fluid was removed. IMPRESSION: Successful ultrasound-guided therapeutic paracentesis yielding 1.9 liters of peritoneal fluid. Read by: Rowe Robert, PA-C Electronically Signed   By: Aletta Edouard M.D.   On: 02/21/2017 15:26    Medications: I have reviewed the patient's current medications.  Assessment/Plan:  1.  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  Respiratory failure secondary to #1, continue supportive care, likely secondary to tumor spread in the lungs  3.  Pain secondary to #1  4.  Diabetes  Jessica Gregory appears unchanged.  I will discontinue some of the oral medications as she has difficulty swallowing pills.  I will schedule a palliative paracentesis for tomorrow.  I explained to the family that improvement from the chemotherapy is not expected to occur rapidly.     LOS: 9 days   Betsy Coder, MD   02/23/2017, 8:47 AM

## 2017-02-24 ENCOUNTER — Inpatient Hospital Stay (HOSPITAL_COMMUNITY): Payer: 59

## 2017-02-24 DIAGNOSIS — R0602 Shortness of breath: Secondary | ICD-10-CM

## 2017-02-24 DIAGNOSIS — R188 Other ascites: Secondary | ICD-10-CM

## 2017-02-24 LAB — COMPREHENSIVE METABOLIC PANEL
ALK PHOS: 63 U/L (ref 38–126)
ALT: 31 U/L (ref 14–54)
AST: 36 U/L (ref 15–41)
Albumin: 2.4 g/dL — ABNORMAL LOW (ref 3.5–5.0)
Anion gap: 10 (ref 5–15)
BILIRUBIN TOTAL: 0.7 mg/dL (ref 0.3–1.2)
BUN: 19 mg/dL (ref 6–20)
CALCIUM: 8.3 mg/dL — AB (ref 8.9–10.3)
CHLORIDE: 90 mmol/L — AB (ref 101–111)
CO2: 37 mmol/L — ABNORMAL HIGH (ref 22–32)
CREATININE: 0.48 mg/dL (ref 0.44–1.00)
Glucose, Bld: 222 mg/dL — ABNORMAL HIGH (ref 65–99)
Potassium: 3.8 mmol/L (ref 3.5–5.1)
Sodium: 137 mmol/L (ref 135–145)
TOTAL PROTEIN: 5.7 g/dL — AB (ref 6.5–8.1)

## 2017-02-24 LAB — CBC WITH DIFFERENTIAL/PLATELET
Basophils Absolute: 0 10*3/uL (ref 0.0–0.1)
Basophils Relative: 0 %
EOS PCT: 0 %
Eosinophils Absolute: 0 10*3/uL (ref 0.0–0.7)
HEMATOCRIT: 37.5 % (ref 36.0–46.0)
Hemoglobin: 11.6 g/dL — ABNORMAL LOW (ref 12.0–15.0)
LYMPHS ABS: 0.3 10*3/uL — AB (ref 0.7–4.0)
LYMPHS PCT: 2 %
MCH: 25.7 pg — ABNORMAL LOW (ref 26.0–34.0)
MCHC: 30.9 g/dL (ref 30.0–36.0)
MCV: 83 fL (ref 78.0–100.0)
Monocytes Absolute: 0.1 10*3/uL (ref 0.1–1.0)
Monocytes Relative: 0 %
Neutro Abs: 16.2 10*3/uL — ABNORMAL HIGH (ref 1.7–7.7)
Neutrophils Relative %: 98 %
PLATELETS: 226 10*3/uL (ref 150–400)
RBC: 4.52 MIL/uL (ref 3.87–5.11)
RDW: 14 % (ref 11.5–15.5)
WBC: 16.6 10*3/uL — AB (ref 4.0–10.5)

## 2017-02-24 LAB — MAGNESIUM
MAGNESIUM: 1.9 mg/dL (ref 1.7–2.4)
Magnesium: 1.9 mg/dL (ref 1.7–2.4)

## 2017-02-24 LAB — URIC ACID
Uric Acid, Serum: 5.1 mg/dL (ref 2.3–6.6)
Uric Acid, Serum: 4.7 mg/dL (ref 2.3–6.6)

## 2017-02-24 LAB — GLUCOSE, CAPILLARY
GLUCOSE-CAPILLARY: 111 mg/dL — AB (ref 65–99)
Glucose-Capillary: 232 mg/dL — ABNORMAL HIGH (ref 65–99)
Glucose-Capillary: 300 mg/dL — ABNORMAL HIGH (ref 65–99)
Glucose-Capillary: 92 mg/dL (ref 65–99)

## 2017-02-24 LAB — PHOSPHORUS
PHOSPHORUS: 2.3 mg/dL — AB (ref 2.5–4.6)
Phosphorus: 1.5 mg/dL — ABNORMAL LOW (ref 2.5–4.6)

## 2017-02-24 MED ORDER — MORPHINE SULFATE ER 30 MG PO TBCR
30.0000 mg | EXTENDED_RELEASE_TABLET | Freq: Two times a day (BID) | ORAL | Status: DC
  Administered 2017-02-24 (×2): 30 mg via ORAL
  Filled 2017-02-24 (×2): qty 1

## 2017-02-24 MED ORDER — PANTOPRAZOLE SODIUM 40 MG PO TBEC
40.0000 mg | DELAYED_RELEASE_TABLET | Freq: Every day | ORAL | Status: DC
  Administered 2017-02-24 – 2017-03-01 (×6): 40 mg via ORAL
  Filled 2017-02-24 (×6): qty 1

## 2017-02-24 MED ORDER — ALBUMIN HUMAN 25 % IV SOLN
25.0000 g | Freq: Once | INTRAVENOUS | Status: AC
  Administered 2017-02-24: 25 g via INTRAVENOUS
  Filled 2017-02-24: qty 100

## 2017-02-24 MED ORDER — POLYETHYLENE GLYCOL 3350 17 G PO PACK: 17.0000 g | PACK | Freq: Every day | ORAL | Status: DC | PRN

## 2017-02-24 MED ORDER — LIDOCAINE HCL 1 % IJ SOLN
INTRAMUSCULAR | Status: AC
  Filled 2017-02-24: qty 20

## 2017-02-24 NOTE — Evaluation (Signed)
Physical Therapy Evaluation Patient Details Name: Jessica Gregory MRN: [id number] DOB: Sep 23, 1950 Today's Date: 02/24/2017   History of Present Illness  67 y.o. female with medical history significant of history of diabetes mellitus type 2, hyperlipidemia was sent to the ER for further evaluation due to abnormal CT scan and and patient feeling generally ill.  Patient has had abdominal discomfort for past couple of months. 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 Has had 1 diagnostic paracentsis and one pallaitive paracentesis thus far     Clinical Impression  On eval, pt required Min assist for mobility. She walked ~150 feet with a RW. Pt expressed some abdominal pain with activity. She tires easily after activity. Remained on [place] O2-sats 89-91% on 2L. Daughter present during session to provide history and assist with translation. Discussed d/c plan-pt will return home with daughter assisting as needed. Recommend RW for ambulation. Will follow during hospital stay.     Follow Up Recommendations Supervision/Assistance - 24 hour    Equipment Recommendations  Rolling walker with 5" wheels (youth height)    Recommendations for Other Services       Precautions / Restrictions Precautions Precautions: Fall Precaution Comments: monitor O2 sats (89-91% throughout session on 2 liters); cannot lay flat ("can't breath") Restrictions Weight Bearing Restrictions: No      Mobility  Bed Mobility Overal bed mobility: Needs Assistance Bed Mobility: Supine to Sit;Sit to Supine     Supine to sit: Supervision;HOB elevated Sit to supine: Min assist;HOB elevated   General bed mobility comments: Assist for LEs onto bed. Uncontrolled trunk descent.   Transfers Overall transfer level: Needs assistance Equipment used: Rolling walker (2 wheeled) Transfers: Sit to/from Stand Sit to Stand: Min guard         General transfer  comment: close guard for safety. Pt tends to pull up on walker.   Ambulation/Gait Ambulation/Gait assistance: Min guard Ambulation Distance (Feet): 150 Feet Assistive device: Rolling walker (2 wheeled) Gait Pattern/deviations: Step-through pattern;Decreased stride length     General Gait Details: close guard for safety. gesturing cues for proximity to walker and direction of walk. Remained on [place] O2 during session. Attempted to take a few steps without walker-gait more effortful.   Stairs            Wheelchair Mobility    Modified Rankin (Stroke Patients Only)       Balance Overall balance assessment: Needs assistance Sitting-balance support: No upper extremity supported;Feet supported Sitting balance-Leahy Scale: Good     Standing balance support: No upper extremity supported;During functional activity Standing balance-Leahy Scale: Fair Standing balance comment: standing at toilet to adjust clothing and hygiene                             Pertinent Vitals/Pain Pain Assessment: Faces Faces Pain Scale: Hurts little more Pain Location: abdomen Pain Descriptors / Indicators: Grimacing;Guarding;Sore;Aching Pain Intervention(s): Limited activity within patient's tolerance;Repositioned    Home Living Family/patient expects to be discharged to:: Private residence Living Arrangements: Children Available Help at Discharge: Family Type of Home: House Home Access: Stairs to enter   Technical brewer of Steps: 2 +1 Home Layout: One level Home Equipment: Hand held shower head      Prior Function Level of Independence: Independent               Hand Dominance   Dominant Hand: Right    Extremity/Trunk Assessment  Upper Extremity Assessment Upper Extremity Assessment: Defer to OT evaluation    Lower Extremity Assessment Lower Extremity Assessment: Generalized weakness    Cervical / Trunk Assessment Cervical / Trunk Assessment: Normal   Communication   Communication: Prefers language other than English(vietnamese)  Cognition Arousal/Alertness: Awake/alert Behavior During Therapy: WFL for tasks assessed/performed Overall Cognitive Status: Difficult to assess                                 General Comments: appears WFL. family present to translate      General Comments      Exercises     Assessment/Plan    PT Assessment Patient needs continued PT services  PT Problem List Decreased activity tolerance;Decreased balance;Decreased mobility;Decreased strength;Pain;Decreased knowledge of use of DME       PT Treatment Interventions Gait training;DME instruction;Functional mobility training;Balance training;Patient/family education;Therapeutic exercise;Therapeutic activities    PT Goals (Current goals can be found in the Care Plan section)  Acute Rehab PT Goals Patient Stated Goal: dtr (to take pt home) PT Goal Formulation: With family Time For Goal Achievement: 03/10/17 Potential to Achieve Goals: Fair    Frequency Min 3X/week   Barriers to discharge        Co-evaluation   Reason for Co-Treatment: To address functional/ADL transfers   OT goals addressed during session: ADL's and self-care;Strengthening/ROM       AM-PAC PT "6 Clicks" Daily Activity  Outcome Measure Difficulty turning over in bed (including adjusting bedclothes, sheets and blankets)?: A Lot Difficulty moving from lying on back to sitting on the side of the bed? : Unable Difficulty sitting down on and standing up from a chair with arms (e.g., wheelchair, bedside commode, etc,.)?: Unable Help needed moving to and from a bed to chair (including a wheelchair)?: A Little Help needed walking in hospital room?: A Little Help needed climbing 3-5 steps with a railing? : A Lot 6 Click Score: 12    End of Session Equipment Utilized During Treatment: Oxygen;Gait belt Activity Tolerance: Patient tolerated treatment well Patient  left: in bed;with call bell/phone within reach;with family/visitor present;with bed alarm set   PT Visit Diagnosis: Muscle weakness (generalized) (M62.81);Difficulty in walking, not elsewhere classified (R26.2);Pain Pain - part of body: (abdomen)    Time: 3154-0086 PT Time Calculation (min) (ACUTE ONLY): 24 min   Charges:   PT Evaluation $PT Eval Moderate Complexity: 1 Mod     PT G Codes:          Weston Anna, MPT Pager: [phone]

## 2017-02-24 NOTE — Procedures (Signed)
Ultrasound-guided  therapeutic paracentesis performed yielding 920 cc of slightly hazy, amber fluid. No immediate complications.

## 2017-02-24 NOTE — Evaluation (Signed)
Occupational Therapy Evaluation and Discharge Patient Details Name: Jessica Gregory MRN: [id number] DOB: 04-12-1950 Today's Date: 02/24/2017    History of Present Illness Jessica Gregory is a 67 y.o. female with medical history significant of history of diabetes mellitus type 2, hyperlipidemia was sent to the ER for further evaluation due to abnormal CT scan and and patient feeling generally ill.  Patient has had abdominal discomfort for past couple of months. 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 Has had 1 diagnostic paracentsis and one pallaitive paracentesis thus far   Clinical Impression   This 68 yo female admitted and found to have above presents to acute with increased need for supplemental O2 and  increased pain abdomen thus affecting her safety and PLOF at in an independent level. Dtr will be with her and can help her as much as she needs it. The pt only needs an 3n1 from an OT standpoint.     Follow Up Recommendations  No OT follow up;Supervision/Assistance - 24 hour    Equipment Recommendations  3 in 1 bedside commode;Other (comment)(RW)       Precautions / Restrictions Precautions Precautions: Fall Precaution Comments: monitor O2 sats (89-91% throughout session on 2 liters); cannot lay flat ("can't breath") Restrictions Weight Bearing Restrictions: No      Mobility Bed Mobility Overal bed mobility: Needs Assistance Bed Mobility: Supine to Sit;Sit to Supine     Supine to sit: Supervision;HOB elevated Sit to supine: Min assist;HOB elevated(for legs)      Transfers Overall transfer level: Needs assistance Equipment used: Rolling walker (2 wheeled) Transfers: Sit to/from Stand Sit to Stand: Min guard              Balance Overall balance assessment: Needs assistance Sitting-balance support: No upper extremity supported;Feet supported Sitting balance-Leahy Scale: Good     Standing  balance support: No upper extremity supported;During functional activity Standing balance-Leahy Scale: Fair Standing balance comment: standing at toilet to adjust clothing and hygiene                           ADL either performed or assessed with clinical judgement   ADL Overall ADL's : Needs assistance/impaired Eating/Feeding: Independent;Sitting   Grooming: Set up;Sitting   Upper Body Bathing: Set up;Sitting   Lower Body Bathing: Moderate assistance Lower Body Bathing Details (indicate cue type and reason): min gaurd A sit<>stand Upper Body Dressing : Set up;Sitting   Lower Body Dressing: Moderate assistance Lower Body Dressing Details (indicate cue type and reason): min guard A sit<>stand Toilet Transfer: Min guard;Ambulation;RW;Regular Toilet;Grab bars   Toileting- Clothing Manipulation and Hygiene: Min guard;Sit to/from Nurse, children's Details (indicate cue type and reason): pt has walk in shower at home with hand held shower head. I educated dtr (who will be at home with her) that it would probably work best if she faces the 3n1 towards the door so that she can get to her mom easily to help if needed)         Vision Patient Visual Report: No change from baseline              Pertinent Vitals/Pain Pain Assessment: Faces Faces Pain Scale: Hurts little more Pain Location: abdominal Pain Intervention(s): Limited activity within patient's tolerance;Repositioned     Hand Dominance Right   Extremity/Trunk Assessment Upper Extremity Assessment Upper Extremity Assessment: Overall WFL for tasks assessed   Lower  Extremity Assessment Lower Extremity Assessment: Defer to PT evaluation       Communication Communication Communication: Prefers language other than English(vietnamese)   Cognition Arousal/Alertness: Awake/alert Behavior During Therapy: WFL for tasks assessed/performed Overall Cognitive Status: Within Functional Limits for tasks  assessed                                                Home Living Family/patient expects to be discharged to:: Private residence Living Arrangements: Children Available Help at Discharge: Family Type of Home: House Home Access: Stairs to enter Technical brewer of Steps: 2 +1   Home Layout: One level     Bathroom Shower/Tub: Walk-in shower;Door   [employer] Toilet: Standard     Home Equipment: Hand held shower head          Prior Functioning/Environment Level of Independence: Independent                 OT Problem List: Cardiopulmonary status limiting activity;Impaired balance (sitting and/or standing);Pain;Increased edema         OT Goals(Current goals can be found in the care plan section) Acute Rehab OT Goals Patient Stated Goal: dtr (to take pt home)  OT Frequency:             Co-evaluation PT/OT/SLP Co-Evaluation/Treatment: Yes(partial) Reason for Co-Treatment: To address functional/ADL transfers   OT goals addressed during session: ADL's and self-care;Strengthening/ROM      AM-PAC PT "6 Clicks" Daily Activity     Outcome Measure Help from another person eating meals?: None Help from another person taking care of personal grooming?: A Little Help from another person toileting, which includes using toliet, bedpan, or urinal?: A Little Help from another person bathing (including washing, rinsing, drying)?: A Little Help from another person to put on and taking off regular upper body clothing?: A Little Help from another person to put on and taking off regular lower body clothing?: A Lot 6 Click Score: 18   End of Session Equipment Utilized During Treatment: Gait belt;Rolling walker;Oxygen(2 liters)  Activity Tolerance: Patient tolerated treatment well Patient left: in bed;with call bell/phone within reach;with family/visitor present(pt had ted hose rolled down to "give her legs a break". I explained to her dtr that this not  good due to the "ring" it is leaving around her leg, that better to take off for an hour and then put back on (dtr to put back on or she will ask staff too ))  OT Visit Diagnosis: Unsteadiness on feet (R26.81);Pain Pain - part of body: (abodmen)                Time: 9935-7017 OT Time Calculation (min): 16 min Charges:  OT General Charges $OT Visit: 1 Visit OT Evaluation $OT Eval Moderate Complexity: [address] [place] [phone] 02/24/2017

## 2017-02-24 NOTE — Progress Notes (Addendum)
Initial Nutrition Assessment  DOCUMENTATION CODES:   Not applicable  INTERVENTION:    Glucerna Shake po TID, each supplement provides 220 kcal and 10 grams of protein  Magic cup TID with meals, each supplement provides 290 kcal and 9 grams of protein  NUTRITION DIAGNOSIS:   Inadequate oral intake related to poor appetite, early satiety as evidenced by per patient/family report.  GOAL:   Patient will meet greater than or equal to 90% of their needs  MONITOR:   PO intake, Supplement acceptance, Weight trends, Labs, I & O's  REASON FOR ASSESSMENT:   Malnutrition Screening Tool    ASSESSMENT:   Pt with PMH significant for DM and HLD. Was recently found to have abnormal CT reading concerning for widespread metastatic disease. Admitted for newly diagnosed peritoneal carcinomatosis, hepatic lesions, and multifocal bilateral pulmonary involvement.   1/18- paracentesis 2.5 L drained 1/25- paracentesis drained 1.85 L/ Cycle 1 Taxol/carboplatin  Spoke with daughter at bedside. Pt is to have third paracentesis this afternoon. States pt's appetite had been poor two weeks prior to admission related to abdominal pain upon eating. After her first paracentesis, pt was eating well with minimal pain. PO intake has since decreased as a result of more fluid accumulation/early satiety. Pt ordering meals with mostly fruits, vegetables, and soup. Discussed the importance of protein to preserve lean body mass. Pt drinks one Glucerna per day. Encouraged 2-3 if she is able. Will add magic cups to provide more calories.   Daughter reports pt's UBW stays around 111 lb. Weight shows to fluctuate this admission likely from fluid accumulation. Nutrition-Focused physical exam completed. If poor PO intake persist pt is at high risk for becoming malnourished.   Medications reviewed and include: colace, 40 mg lasix, SSI Labs reviewed: Cl 90 (L) CBG 216-222 (H) Phosphorus 2.3 (L)  NUTRITION - FOCUSED PHYSICAL  EXAM:    Most Recent Value  Orbital Region  No depletion  Upper Arm Region  No depletion  Thoracic and Lumbar Region  Unable to assess  Buccal Region  No depletion  Temple Region  Mild depletion  Clavicle Bone Region  Mild depletion  Clavicle and Acromion Bone Region  Mild depletion  Scapular Bone Region  Unable to assess  Dorsal Hand  No depletion  Patellar Region  No depletion  Anterior Thigh Region  No depletion  Posterior Calf Region  No depletion  Edema (RD Assessment)  Mild  Hair  Reviewed  Eyes  Reviewed  Mouth  Reviewed  Skin  Reviewed  Nails  Reviewed     Diet Order:  Diet regular Room service appropriate? Yes; Fluid consistency: Thin  EDUCATION NEEDS:   Education needs have been addressed  Skin:  Skin Assessment: Reviewed RN Assessment  Last BM:  02/23/17  Height:   Ht Readings from Last 1 Encounters:  02/23/17 4' 10.5" (1.486 m)    Weight:   Wt Readings from Last 1 Encounters:  02/24/17 133 lb 9.6 oz (60.6 kg)    Ideal Body Weight:  44.3 kg  BMI:  Body mass index is 27.45 kg/m.  Estimated Nutritional Needs:   Kcal:  1400-1600 kcal/day  Protein:  70-80 g/day  Fluid:  >1.4 L/day    Mariana Single RD, LDN Clinical Nutrition Pager # - [phone]

## 2017-02-24 NOTE — Progress Notes (Signed)
The patient is receiving Protonix by the intravenous route.  Based on criteria approved by the Pharmacy and [facility], the medication is being converted to the equivalent oral dose form.  These criteria include: -No active GI bleeding -Able to tolerate diet of full liquids (or better) or tube feeding -Able to tolerate other medications by the oral or enteral route  If you have any questions about this conversion, please contact the Pharmacy Department (phone [phone]).  Thank you. Eudelia Bunch, Pharm.D. [phone] 02/24/2017 7:38 AM

## 2017-02-24 NOTE — Progress Notes (Signed)
IP PROGRESS NOTE  Subjective:  Patient has received her systemic chemotherapy on 02/21/17 after undergoing additional therapeutic paracentesis.  Appears to have tolerated chemotherapy without difficulties.  Over the weekend, passed some bowel movements, appears to have improved respiratory symptoms and somewhat decreased abdominal discomfort.  Did have some hallucinosis and confusion according to the family members.  At this time, reports increasing abdominal fullness and discomfort, requesting additional paracentesis.  According to the notes, a procedure was going to be ordered yesterday, but does not appear to have gone through.   Objective: Vital signs in last 24 hours: Blood pressure 126/70, pulse 82, temperature 97.6 F (36.4 C), temperature source Oral, resp. rate 16, height 4' 10.5" (1.486 m), weight 133 lb 9.6 oz (60.6 kg), SpO2 98 %.  Intake/Output from previous day: 01/27 0701 - 01/28 0700 In: 240 [P.O.:240] Out: -   Physical Exam: Patient appears generally ill, somnolent likely due to recently administered morphine.  Wakes up and interacts with voice stimulation.  Appears to be answering questions appropriately in [place] HEENT: Anicteric sclera, moist mucous membranes Lungs: Decreased breath sounds bilaterally with expiratory wheezing and frequent nonproductive cough Cardiac: S1/S2, regular, no murmurs Abdomen: Distended, full, diffusely tender.  Positive bowel sounds Lymph nodes: No palpable lymphadenopathy in the cervical, supraclavicular, axillary, or inguinal regions Neurologic: No gross focal neurological deficits Skin: No rash, petechiae, or ecchymosis. Musculoskeletal: Bilateral lower extremity swelling.   Lab Results: Recent Labs    02/23/17 0430 02/24/17 0520  WBC 16.5* 16.6*  HGB 11.6* 11.6*  HCT 36.7 37.5  PLT 248 226    BMET Recent Labs    02/23/17 0430 02/24/17 0520  NA 135 137  K 3.9 3.8  CL 92* 90*  CO2 32 37*  GLUCOSE 216* 222*  BUN  27* 19  CREATININE 0.58 0.48  CALCIUM 8.2* 8.3*    Lab Results  Component Value Date   CEA1 0.9 02/19/2017    Studies/Results: No results found.  Medications: I have reviewed the patient's current medications.  Assessment/Plan: 67 year old female who presented with abdominal pain and partial bowel obstruction with findings of widely metastatic malignancy including peritoneal carcinomatosis, hepatic lesions, and multifocal bilateral pulmonary involvement.  Peritoneal fluid positive for presence of cells consistent with adenocarcinoma.    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  Nevertheless, distribution of the disease as well as elevation of CA 125 and normal CA19-9 with noncontributory immunohistochemical profile favor primary peritoneal or ovarian etiology. Received the initial chemotherapy with carboplatin AUC 5 + paclitaxel 125 mg/m Q21d on 02/21/17.  No evidence of tumor lysis syndrome so far.  Previously noted hyperkalemia might of been attributable to the spontaneous tumor lysis or renal dysfunction due to ascites. --Continue monitoring for tumor lysis every 12 hours.  For now, leaving off allopurinol, but will initiate if significant hyperuricemia is noted.  In case of progressive hyperuricemia or increasing creatinine, will use rasburicase.  **Acute hypoxic respiratory failure: Patient is currently requiring oxygen supplementation to keep saturations adequate.  Differential includes pulmonary infection versus more likely lymphangitic infiltration by the underlying malignancy with multifocal pulmonary disease demonstrated by CT of the chest.  Most likely the improvement will occur if tumor response to chemotherapy.  Patient has received 10 days of antibiotic coverage for suspected pneumonia. --Continue oxygen --Stop  Augmentin tomorrow --Will assess possible relief after additional therapeutic paracentesis.  **Recurrent malignant ascites: Due to  underlying malignancy.  Will reaccumulate rapidly until the cancer is addressed. --Repeat therapeutic paracentesis by interventional radiology today.  Remove as much volume as possible with albumin replacement 6g/L removed  **Cancer-associated pain: Patient seems to be able to stay awake enough at this current medication levels with no uncontrolled pain. --Current pain regimen:      --MS Contin 60mg  PO Q12 hrs    --Morphine 2-4mg  IV Q2hrs PRN --Decrease MS Contin dose to 30mg  PO BID due to hallucinosis  **Diabetes mellitus: As expected, glucose is higher and now that patient has received glucocorticoids with her chemotherapy. --Continue SSI for now, may have to increase to higher grade scale  **Hyponatremia: Complex hyponatremia due to intravascular volume depletion, pain, nausea, metastatic disease in the liver.  Improved significantly over the weekend and resolved completely today. --Continue volume supplementation carefully due to potential to exacerbate ascites --We will monitor electrolytes closely  **PPx:  --Continue pantoprazole for stress ulcer prophylaxis --Holding enoxaparin ppx for now due to need for repeat paracentesis, using mechanical DVT ppx  **Code Status: FULL CODE  **Disposition: Patient continues to remain unstable with multiple problems and continues to require ongoing hospitalization.  This week, if the pain is well controlled, respiratory status improves, and patient is able to tolerate oral nutrition, will discharge home for outpatient follow-up in the cancer center. --Consult physical therapy, occupational therapy, and nutrition services for multifactorial assessment and possible need for skilled nursing/rehab placement on discharge.      LOS: 10 days   Ardath Sax, MD   02/24/2017, 2:58 PM

## 2017-02-25 DIAGNOSIS — C762 Malignant neoplasm of abdomen: Secondary | ICD-10-CM

## 2017-02-25 DIAGNOSIS — E876 Hypokalemia: Secondary | ICD-10-CM

## 2017-02-25 LAB — COMPREHENSIVE METABOLIC PANEL
ALBUMIN: 2.9 g/dL — AB (ref 3.5–5.0)
ALT: 31 U/L (ref 14–54)
ANION GAP: 13 (ref 5–15)
AST: 35 U/L (ref 15–41)
Alkaline Phosphatase: 61 U/L (ref 38–126)
BILIRUBIN TOTAL: 0.7 mg/dL (ref 0.3–1.2)
BUN: 16 mg/dL (ref 6–20)
CO2: 37 mmol/L — ABNORMAL HIGH (ref 22–32)
Calcium: 8.3 mg/dL — ABNORMAL LOW (ref 8.9–10.3)
Chloride: 86 mmol/L — ABNORMAL LOW (ref 101–111)
Creatinine, Ser: 0.49 mg/dL (ref 0.44–1.00)
GFR calc Af Amer: >60 mL/min (ref >60)
GFR calc non Af Amer: >60 mL/min (ref >60)
GLUCOSE: 177 mg/dL — AB (ref 65–99)
POTASSIUM: 3.1 mmol/L — AB (ref 3.5–5.1)
SODIUM: 136 mmol/L (ref 135–145)
Total Protein: 5.8 g/dL — ABNORMAL LOW (ref 6.5–8.1)

## 2017-02-25 LAB — GLUCOSE, CAPILLARY
GLUCOSE-CAPILLARY: 184 mg/dL — AB (ref 65–99)
GLUCOSE-CAPILLARY: 139 mg/dL — AB (ref 65–99)
Glucose-Capillary: 148 mg/dL — ABNORMAL HIGH (ref 65–99)

## 2017-02-25 LAB — CBC WITH DIFFERENTIAL/PLATELET
BASOS PCT: 0 %
Basophils Absolute: 0 10*3/uL (ref 0.0–0.1)
EOS ABS: 0 10*3/uL (ref 0.0–0.7)
Eosinophils Relative: 0 %
HEMATOCRIT: 38.4 % (ref 36.0–46.0)
Hemoglobin: 12 g/dL (ref 12.0–15.0)
Lymphocytes Relative: 5 %
Lymphs Abs: 0.7 10*3/uL (ref 0.7–4.0)
MCH: 25.9 pg — ABNORMAL LOW (ref 26.0–34.0)
MCHC: 31.3 g/dL (ref 30.0–36.0)
MCV: 82.8 fL (ref 78.0–100.0)
MONO ABS: 0.1 10*3/uL (ref 0.1–1.0)
MONOS PCT: 1 %
Neutro Abs: 12.3 10*3/uL — ABNORMAL HIGH (ref 1.7–7.7)
Neutrophils Relative %: 94 %
Platelets: 200 10*3/uL (ref 150–400)
RBC: 4.64 MIL/uL (ref 3.87–5.11)
RDW: 13.8 % (ref 11.5–15.5)
WBC: 13 10*3/uL — ABNORMAL HIGH (ref 4.0–10.5)

## 2017-02-25 LAB — MAGNESIUM: MAGNESIUM: 1.8 mg/dL (ref 1.7–2.4)

## 2017-02-25 LAB — PHOSPHORUS: PHOSPHORUS: 1.7 mg/dL — AB (ref 2.5–4.6)

## 2017-02-25 LAB — URIC ACID: URIC ACID, SERUM: 4.2 mg/dL (ref 2.3–6.6)

## 2017-02-25 MED ORDER — DEXAMETHASONE SODIUM PHOSPHATE 4 MG/ML IJ SOLN
2.0000 mg | INTRAMUSCULAR | Status: DC
  Administered 2017-02-25 – 2017-03-01 (×5): 2 mg via INTRAVENOUS
  Filled 2017-02-25 (×5): qty 1

## 2017-02-25 MED ORDER — SIMETHICONE 40 MG/0.6ML PO SUSP
40.0000 mg | Freq: Four times a day (QID) | ORAL | Status: DC | PRN
  Filled 2017-02-25: qty 0.6

## 2017-02-25 MED ORDER — DIPHENHYDRAMINE HCL 12.5 MG/5ML PO ELIX
12.5000 mg | ORAL_SOLUTION | Freq: Four times a day (QID) | ORAL | Status: DC | PRN
  Administered 2017-03-02: 12.5 mg via ORAL
  Filled 2017-02-25: qty 5

## 2017-02-25 MED ORDER — ONDANSETRON HCL 4 MG/2ML IJ SOLN
4.0000 mg | Freq: Four times a day (QID) | INTRAMUSCULAR | Status: DC | PRN
  Administered 2017-02-25 – 2017-02-26 (×2): 4 mg via INTRAVENOUS
  Filled 2017-02-25 (×2): qty 2

## 2017-02-25 MED ORDER — MORPHINE SULFATE 2 MG/ML IV SOLN
INTRAVENOUS | Status: DC
  Administered 2017-02-25: 12 mg via INTRAVENOUS
  Administered 2017-02-25: 6 mg via INTRAVENOUS
  Administered 2017-02-26: 9 mg via INTRAVENOUS
  Administered 2017-02-26: 4 mg via INTRAVENOUS
  Administered 2017-02-26: 6 mg via INTRAVENOUS
  Administered 2017-02-26: 15:00:00 via INTRAVENOUS
  Administered 2017-02-26: 12 mg via INTRAVENOUS
  Administered 2017-02-26: 9 mg via INTRAVENOUS
  Administered 2017-02-27: 4 mg via INTRAVENOUS
  Administered 2017-02-27: 5 mg via INTRAVENOUS
  Administered 2017-02-27: 2 mg via INTRAVENOUS
  Administered 2017-02-27: 8 mg via INTRAVENOUS
  Administered 2017-02-27: 22:00:00 via INTRAVENOUS
  Administered 2017-02-27: 9.5 mg via INTRAVENOUS
  Administered 2017-02-27: 4 mg via INTRAVENOUS
  Administered 2017-02-28: 17 mg via INTRAVENOUS
  Administered 2017-02-28: 17.92 mg via INTRAVENOUS
  Administered 2017-02-28: 13.87 mg via INTRAVENOUS
  Administered 2017-02-28: 10 mg via INTRAVENOUS
  Administered 2017-02-28: 27.5 mg via INTRAVENOUS
  Administered 2017-03-01: 25.9 mg via INTRAVENOUS
  Administered 2017-03-01: 18.86 mg via INTRAVENOUS
  Administered 2017-03-01: 05:00:00 via INTRAVENOUS
  Administered 2017-03-01: 8.42 mg via INTRAVENOUS
  Filled 2017-02-25 (×4): qty 30

## 2017-02-25 MED ORDER — NALOXONE HCL 0.4 MG/ML IJ SOLN: 0.4000 mg | INTRAMUSCULAR | Status: DC | PRN

## 2017-02-25 MED ORDER — MORPHINE SULFATE 2 MG/ML IV SOLN
INTRAVENOUS | Status: DC
  Administered 2017-02-25: 2 mg via INTRAVENOUS
  Administered 2017-02-25: 5 mg via INTRAVENOUS
  Filled 2017-02-25: qty 30

## 2017-02-25 MED ORDER — SODIUM CHLORIDE 0.9% FLUSH: 9.0000 mL | INTRAVENOUS | Status: DC | PRN

## 2017-02-25 MED ORDER — DIPHENHYDRAMINE HCL 50 MG/ML IJ SOLN: 12.5000 mg | Freq: Four times a day (QID) | INTRAMUSCULAR | Status: DC | PRN

## 2017-02-25 MED ORDER — POTASSIUM PHOSPHATES 15 MMOLE/5ML IV SOLN
15.0000 mmol | Freq: Once | INTRAVENOUS | Status: AC
  Administered 2017-02-25: 15 mmol via INTRAVENOUS
  Filled 2017-02-25: qty 5

## 2017-02-25 MED ORDER — POTASSIUM CHLORIDE 10 MEQ/50ML IV SOLN
10.0000 meq | INTRAVENOUS | Status: DC
  Administered 2017-02-25 (×3): 10 meq via INTRAVENOUS
  Filled 2017-02-25 (×4): qty 50

## 2017-02-25 NOTE — Progress Notes (Signed)
IP PROGRESS NOTE  Subjective:  Patient had a repeat paracentesis yesterday with some pain relief.  During the night and during the day, pain ranges from 7-10/10.  Breathing has improved, pain has been limiting patient from ambulating more.  Patient was able to only take approximately 1 can of Ensure over yesterday due to increasing abdominal pain.  Objective: Vital signs in last 24 hours: Blood pressure 132/64, pulse (!) 116, temperature 97.9 F (36.6 C), temperature source Oral, resp. rate 14, height 4' 10.5" (1.486 m), weight 134 lb 0.6 oz (60.8 kg), SpO2 94 %.  Intake/Output from previous day: 01/28 0701 - 01/29 0700 In: 60 [P.O.:60] Out: -   Physical Exam: Patient appears generally ill, somnolent likely due to recently administered morphine.  Wakes up and interacts with voice stimulation.  Appears to be answering questions appropriately in [place] HEENT: Anicteric sclera, moist mucous membranes Lungs: Decreased breath sounds bilaterally with expiratory wheezing and frequent nonproductive cough Cardiac: S1/S2, regular, no murmurs Abdomen: Distended, full, diffusely tender.  Positive bowel sounds Lymph nodes: No palpable lymphadenopathy in the cervical, supraclavicular, axillary, or inguinal regions Neurologic: No gross focal neurological deficits Skin: No rash, petechiae, or ecchymosis. Musculoskeletal: Bilateral lower extremity swelling.   Lab Results: Recent Labs    02/24/17 0520 02/25/17 0448  WBC 16.6* 13.0*  HGB 11.6* 12.0  HCT 37.5 38.4  PLT 226 200    BMET Recent Labs    02/24/17 0520 02/25/17 0448  NA 137 136  K 3.8 3.1*  CL 90* 86*  CO2 37* 37*  GLUCOSE 222* 177*  BUN 19 16  CREATININE 0.48 0.49  CALCIUM 8.3* 8.3*    Lab Results  Component Value Date   CEA1 0.9 02/19/2017    Studies/Results: US Paracentesis  Result Date: 02/24/2017 INDICATION: Metastatic adenocarcinoma, 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  Request made for therapeutic paracentesis. EXAM: ULTRASOUND GUIDED THERAPEUTIC PARACENTESIS MEDICATIONS: 1% lidocaine local COMPLICATIONS: None immediate. PROCEDURE: Informed written consent was obtained from the patient after a discussion of the risks, benefits and alternatives to treatment. A timeout was performed prior to the initiation of the procedure. Initial ultrasound scanning demonstrates a small amount of ascites within the right mid to lower abdominal quadrant. The right mid to lower abdomen was prepped and draped in the usual sterile fashion. 1% lidocaine was used for local anesthesia. Following this, a Yueh catheter was introduced. An ultrasound image was saved for documentation purposes. The paracentesis was performed. The catheter was removed and a dressing was applied. The patient tolerated the procedure well without immediate post procedural complication. FINDINGS: A total of approximately 920 cc of slightly hazy, amber fluid was removed. IMPRESSION: Successful ultrasound-guided therapeutic paracentesis yielding 920 cc of peritoneal fluid. Read by: Rowe Robert, PA-C Electronically Signed   By: Jerilynn Mages.  Shick M.D.   On: 02/24/2017 16:37    Medications: I have reviewed the patient's current medications.  Assessment/Plan: 67 year old female who presented with abdominal pain and partial bowel obstruction with findings of widely metastatic malignancy including peritoneal carcinomatosis, hepatic lesions, and multifocal bilateral pulmonary involvement.  Peritoneal fluid positive for presence of cells consistent with adenocarcinoma.    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  Nevertheless, distribution of the disease as well as elevation of CA 125 and normal CA19-9 with noncontributory immunohistochemical profile favor primary peritoneal  or ovarian etiology. Received the initial chemotherapy with carboplatin  AUC 5 + paclitaxel 125 mg/m Q21d on 02/21/17.  No evidence of tumor lysis syndrome so far.  Previously noted hyperkalemia might of been attributable to the spontaneous tumor lysis or renal dysfunction due to ascites. --Continue monitoring for tumor lysis every 12 hours.  For now, leaving off allopurinol, but will initiate if significant hyperuricemia is noted.  In case of progressive hyperuricemia or increasing creatinine, will use rasburicase.  **Acute hypoxic respiratory failure: Patient is currently requiring oxygen supplementation to keep saturations adequate.  Differential includes pulmonary infection versus more likely lymphangitic infiltration by the underlying malignancy with multifocal pulmonary disease demonstrated by CT of the chest.  Most likely the improvement will occur if tumor response to chemotherapy.  Patient has received 10 days of antibiotic coverage for suspected pneumonia. --Continue oxygen --Stop Augmentin today  **Recurrent malignant ascites: Due to underlying malignancy.  Will reaccumulate rapidly until the cancer is addressed. Paracentesis 02/21/17, 02/24/17 --Repeat therapeutic paracentesis by interventional radiology as needed.  Remove as much volume as possible with albumin replacement 6g/L removed  **Cancer-associated pain: Patient seems to be able to stay awake enough at this current medication levels with no uncontrolled pain. Pain control inadequate --Current pain regimen:     --MS Contin 30mg  PO Q12 hrs    --Morphine 2-4mg  IV Q2hrs PRN --D/c current pain meds and replace with PCA pump: 4mg  bolus, then 1mg  IV Q38min PRN -- will use to establish baseline requirement for pain control.  Tomorrow, will add 50% of the required as needed doses as a long-acting/continuous infusion.  We will continue monitoring for sedation.  **Diabetes mellitus: As expected, glucose is higher and now that patient has  received glucocorticoids with her chemotherapy. --Continue SSI for now, may have to increase to higher grade scale  **Malnutrition, combined calorie/protein, severe: Severe decrease in oral intake due to peritoneal carcinomatosis and partial bowel obstruction with pain exacerbated by food intake.  Patient has been seen by dietitian.  Phosphorus level has stabilized, albumin is slightly improved compared to the prior days. --Continue advancing nutrition as possible/tolerable.  **Electrolytes/Fluids:  Hyponatremia: Complex hyponatremia due to intravascular volume depletion, pain, nausea, metastatic disease in the liver.  Improved significantly over the weekend and resolved completely today.  Hypokalemia: Potassium has dropped today likely due to increased urine output and insufficient oral intake. --Continue volume supplementation carefully due to potential to exacerbate ascites --We will supplement with potassium chloride IV: 25mEq today.  **PPx:  --Continue pantoprazole for stress ulcer prophylaxis --Holding enoxaparin ppx for now due to need for repeat paracentesis, using mechanical DVT ppx  **Code Status: FULL CODE  **Disposition: Patient continues to remain unstable with multiple problems and continues to require ongoing hospitalization.  This week, if the pain is well controlled, respiratory status improves, and patient is able to tolerate oral nutrition, will discharge home for outpatient follow-up in the cancer center.  --Currently, uncontrolled pain, inadequate food intake.  Constipation/partial bowel obstruction.     LOS: 11 days   Ardath Sax, MD   02/25/2017, 9:09 AM

## 2017-02-25 NOTE — Care Management Note (Signed)
Case Management Note  Patient Details  Name: Jessica Gregory MRN: [id number] Date of Birth: 22-Feb-1950  Subjective/Objective:  67 yo admitted with Sepsis                  Action/Plan: From home with daughter and plans to return at discharge.  Expected Discharge Date:                  Expected Discharge Plan:  Home/Self Care  In-House Referral:     Discharge planning Services  CM Consult  Post Acute Care Choice:    Choice offered to:     DME Arranged:    DME Agency:     HH Arranged:    HH Agency:     Status of Service:  In process, will continue to follow  If discussed at Long Length of Stay Meetings, dates discussed:    Additional CommentsLynnell Catalan, RN 02/25/2017, 11:17 AM  [phone]

## 2017-02-25 NOTE — Progress Notes (Signed)
Pt speaks Mike Gip primarily and also Rh[place], a [place] dialect from [employer] these languages on the translator ipad tool and audio-translator is difficult.The daughter at the bedside was raised here in [place], has been serving as her translator, and is planning to stay at her bedside during her hospitalization.  Elizbeth Squires RN 3:44 PM 02/25/2017

## 2017-02-26 ENCOUNTER — Inpatient Hospital Stay (HOSPITAL_COMMUNITY): Payer: 59

## 2017-02-26 DIAGNOSIS — E878 Other disorders of electrolyte and fluid balance, not elsewhere classified: Secondary | ICD-10-CM

## 2017-02-26 DIAGNOSIS — J9601 Acute respiratory failure with hypoxia: Secondary | ICD-10-CM

## 2017-02-26 DIAGNOSIS — J9600 Acute respiratory failure, unspecified whether with hypoxia or hypercapnia: Secondary | ICD-10-CM

## 2017-02-26 LAB — COMPREHENSIVE METABOLIC PANEL
ALT: 32 U/L (ref 14–54)
AST: 35 U/L (ref 15–41)
Albumin: 2.5 g/dL — ABNORMAL LOW (ref 3.5–5.0)
Alkaline Phosphatase: 59 U/L (ref 38–126)
Anion gap: 10 (ref 5–15)
BUN: 13 mg/dL (ref 6–20)
CHLORIDE: 86 mmol/L — AB (ref 101–111)
CO2: 40 mmol/L — ABNORMAL HIGH (ref 22–32)
Calcium: 8.1 mg/dL — ABNORMAL LOW (ref 8.9–10.3)
Creatinine, Ser: 0.46 mg/dL (ref 0.44–1.00)
Glucose, Bld: 174 mg/dL — ABNORMAL HIGH (ref 65–99)
POTASSIUM: 3.8 mmol/L (ref 3.5–5.1)
Sodium: 136 mmol/L (ref 135–145)
Total Bilirubin: 0.7 mg/dL (ref 0.3–1.2)
Total Protein: 5.3 g/dL — ABNORMAL LOW (ref 6.5–8.1)

## 2017-02-26 LAB — GLUCOSE, CAPILLARY
GLUCOSE-CAPILLARY: 153 mg/dL — AB (ref 65–99)
Glucose-Capillary: 177 mg/dL — ABNORMAL HIGH (ref 65–99)
Glucose-Capillary: 168 mg/dL — ABNORMAL HIGH (ref 65–99)
Glucose-Capillary: 135 mg/dL — ABNORMAL HIGH (ref 65–99)

## 2017-02-26 LAB — CBC WITH DIFFERENTIAL/PLATELET
BASOS ABS: 0 10*3/uL (ref 0.0–0.1)
Basophils Relative: 0 %
EOS PCT: 0 %
Eosinophils Absolute: 0.1 10*3/uL (ref 0.0–0.7)
HCT: 39.3 % (ref 36.0–46.0)
Hemoglobin: 11.8 g/dL — ABNORMAL LOW (ref 12.0–15.0)
LYMPHS ABS: 0.6 10*3/uL — AB (ref 0.7–4.0)
LYMPHS PCT: 5 %
MCH: 25.2 pg — AB (ref 26.0–34.0)
MCHC: 30 g/dL (ref 30.0–36.0)
MCV: 84 fL (ref 78.0–100.0)
MONO ABS: 0 10*3/uL — AB (ref 0.1–1.0)
Monocytes Relative: 0 %
Neutro Abs: 10.6 10*3/uL — ABNORMAL HIGH (ref 1.7–7.7)
Neutrophils Relative %: 95 %
PLATELETS: 175 10*3/uL (ref 150–400)
RBC: 4.68 MIL/uL (ref 3.87–5.11)
RDW: 14 % (ref 11.5–15.5)
WBC: 11.2 10*3/uL — ABNORMAL HIGH (ref 4.0–10.5)

## 2017-02-26 LAB — BLOOD GAS, ARTERIAL
ACID-BASE EXCESS: 14.5 mmol/L — AB (ref 0.0–2.0)
BICARBONATE: 41.6 mmol/L — AB (ref 20.0–28.0)
Drawn by: 270211
O2 CONTENT: 2 L/min
O2 Saturation: 94.5 %
PATIENT TEMPERATURE: 98.6
pCO2 arterial: 65 mmHg — ABNORMAL HIGH (ref 32.0–48.0)
pH, Arterial: 7.422 (ref 7.350–7.450)
pO2, Arterial: 73.3 mmHg — ABNORMAL LOW (ref 83.0–108.0)

## 2017-02-26 LAB — PHOSPHORUS: PHOSPHORUS: 2.4 mg/dL — AB (ref 2.5–4.6)

## 2017-02-26 LAB — LACTIC ACID, PLASMA: LACTIC ACID, VENOUS: 1.1 mmol/L (ref 0.5–1.9)

## 2017-02-26 LAB — MAGNESIUM: MAGNESIUM: 1.8 mg/dL (ref 1.7–2.4)

## 2017-02-26 LAB — URIC ACID: Uric Acid, Serum: 3.2 mg/dL (ref 2.3–6.6)

## 2017-02-26 MED ORDER — MAGNESIUM SULFATE 2 GM/50ML IV SOLN
2.0000 g | Freq: Once | INTRAVENOUS | Status: AC
  Administered 2017-02-26: 2 g via INTRAVENOUS
  Filled 2017-02-26: qty 50

## 2017-02-26 MED ORDER — DEXTROSE 5 % IV SOLN
10.0000 mmol | Freq: Once | INTRAVENOUS | Status: AC
  Administered 2017-02-26: 10 mmol via INTRAVENOUS
  Filled 2017-02-26: qty 3.33

## 2017-02-26 MED ORDER — ONDANSETRON HCL 4 MG/2ML IJ SOLN
4.0000 mg | Freq: Four times a day (QID) | INTRAMUSCULAR | Status: DC | PRN
  Administered 2017-02-27 – 2017-03-03 (×6): 4 mg via INTRAVENOUS
  Filled 2017-02-26 (×6): qty 2

## 2017-02-26 NOTE — Consult Note (Signed)
PULMONARY / CRITICAL CARE MEDICINE   Name: Jessica Gregory MRN: [id number] DOB: 06-26-1950 PCP Forrest Moron, MD LOS 12 as of 02/26/2017     ADMISSION DATE:  02/17/2017 CONSULTATION DATE:  02/26/2017   REFERRING MD:  Dr Lebron Conners of Oncology  CHIEF COMPLAINT:  Acute resp failure with hypoxemia  HISTORY OF PRESENT ILLNESS:   History is provided by the oncologist, review of the chart and the daughter at the bedside.  Patient does not speak English  Jessica Gregory is a 67 year old [place] female with previous history of type 2 diabetes, hyperlipidemia.  Apparently she has been having some abdominal discomfort for a few months.  She had a CT scan of the abdomen at an urgent care setting which showed ascites with widespread metastatic disease.  She was admitted with dyspnea, fever and some nonproductive cough.  This was associated with poor appetite.  At admission February 14, 2017 she had a CT Joetta Manners chest that ruled out pulmonary embolism but showed diffuse widespread nodular metastatic disease.  Consult has been called because of hypoxemia and patient requiring oxygen.  According to the daughter patient has been on oxygen ever since admission.  According to the nurse patient is stable on 2 L of oxygen ever since admission.  The main issue is abdominal pain for which she gets pain for which she gets paracentesis and opioids.  Patient is currently been made a DNR/DNI but full medical care.  She status post chemotherapy approximately 1 week ago.  Overall she is frail and debilitated.  Pulmonary has been consulted because of hypoxemia  PAST MEDICAL HISTORY :  She  has a past medical history of Diabetes ([place]) and Language barrier (11/16/2014).  PAST SURGICAL HISTORY: She  has a past surgical history that includes IR Paracentesis (02/21/2017).  No Known Allergies  No current facility-administered medications on file prior to encounter.    Current Outpatient Medications on File Prior to  Encounter  Medication Sig  . metFORMIN (GLUCOPHAGE) 500 MG tablet Take 1 tablet (500 mg total) by mouth 2 (two) times daily with a meal.  . Phenylephrine-DM-GG-APAP (TYLENOL COLD/FLU SEVERE PO) Take 2 tablets by mouth every 6 (six) hours as needed (cold/flu symptoms).  . canagliflozin (INVOKANA) 100 MG TABS tablet Take 1 tablet (100 mg total) by mouth daily before breakfast. (Patient not taking: Reported on 02/26/2017)  . clobetasol cream (TEMOVATE) 8.50 % Apply 1 application topically 2 (two) times daily. (Patient not taking: Reported on 02/08/2017)    FAMILY HISTORY:  Her indicated that her mother is deceased. She indicated that her father is deceased. She indicated that all of her three sisters are alive. She indicated that her maternal grandmother is deceased. She indicated that her maternal grandfather is deceased. She indicated that her paternal grandmother is deceased. She indicated that her paternal grandfather is deceased.   SOCIAL HISTORY: She  reports that  has never smoked. she has never used smokeless tobacco. She reports that she does not drink alcohol or use drugs.  REVIEW OF SYSTEMS:   According to history of present illness   VITAL SIGNS: BP 105/66 (BP Location: Right Arm)   Pulse 98   Temp 98.2 F (36.8 C) (Oral)   Resp 13   Ht 4' 10.5" (1.486 m)   Wt 60.8 kg (134 lb 0.6 oz)   SpO2 97%   BMI 27.54 kg/m   HEMODYNAMICS:    VENTILATOR SETTINGS:    INTAKE / OUTPUT: I/O last 3 completed shifts: In: 69 [  P.O.:240; I.V.:1] Out: -      EXAM  General Appearance:    Looks chronically deconditioned and frail and pretty full and frail and pitiful and debilitated  Head:    Normocephalic, without obvious abnormality, atraumatic  Eyes:    PERRL -yes, conjunctiva/corneas -clear      Ears:    Normal external ear canals, both ears  Nose:   NG tube -no  Throat:  ETT TUBE -no, OG tube -no  Neck:   Supple,  No enlargement/tenderness/nodules     Lungs:      very mild  tachypnea but without any paradoxical respiration or accessory muscle use.  Scattered crackles present  Chest wall:    No deformity  He   S1 and S2 normal, no murmur, CVP -no.  Pressors -no intact  Abdomen:     Soft, no masses, no organomegaly  Genitalia:    Not done  Rectal:   not done  Extremities:   Extremities- intact     Skin:   Intact in exposed areas . Sacral area - unclear If decub     Neurologic:   Sedation - oral opioids -> RASS - -2       LABS  PULMONARY Recent Labs  Lab 02/26/17 0752  PHART 7.422  PCO2ART 65.0*  PO2ART 73.3*  HCO3 41.6*  O2SAT 94.5    CBC Recent Labs  Lab 02/24/17 0520 02/25/17 0448 02/26/17 0506  HGB 11.6* 12.0 11.8*  HCT 37.5 38.4 39.3  WBC 16.6* 13.0* 11.2*  PLT 226 200 175    COAGULATION No results for input(s): INR in the last 168 hours.  CARDIAC  No results for input(s): TROPONINI in the last 168 hours. No results for input(s): PROBNP in the last 168 hours.   CHEMISTRY Recent Labs  Lab 02/22/17 0530  02/23/17 0430 02/23/17 1700 02/24/17 0520 02/24/17 1845 02/25/17 0448 02/26/17 0506  NA 126*  --  135  --  137  --  136 136  K 3.9  --  3.9  --  3.8  --  3.1* 3.8  CL 86*  --  92*  --  90*  --  86* 86*  CO2 32  --  32  --  37*  --  37* 40*  GLUCOSE 217*  --  216*  --  222*  --  177* 174*  BUN 31*  --  27*  --  19  --  16 13  CREATININE 0.71  --  0.58  --  0.48  --  0.49 0.46  CALCIUM 7.8*  --  8.2*  --  8.3*  --  8.3* 8.1*  MG 2.1   < > 2.2 1.9 1.9 1.9 1.8 1.8  PHOS 3.5   < > 2.2* 2.1* 2.3* 1.5* 1.7* 2.4*   < > = values in this interval not displayed.   Estimated Creatinine Clearance: 54.9 mL/min (by C-G formula based on SCr of 0.46 mg/dL).   LIVER Recent Labs  Lab 02/22/17 0530 02/23/17 0430 02/24/17 0520 02/25/17 0448 02/26/17 0506  AST 21 38 36 35 35  ALT 20 26 31 31  32  ALKPHOS 62 66 63 61 59  BILITOT 0.8 0.8 0.7 0.7 0.7  PROT 5.8* 5.7* 5.7* 5.8* 5.3*  ALBUMIN 2.7* 2.3* 2.4* 2.9* 2.5*      INFECTIOUS No results for input(s): LATICACIDVEN, PROCALCITON in the last 168 hours.   ENDOCRINE CBG (last 3)  Recent Labs    02/25/17 1120 02/25/17 1813 02/26/17 0749  GLUCAP 148* 139* 177*         IMAGING x48h  - image(s) personally visualized  -   highlighted in bold US Paracentesis  Result Date: 02/24/2017 INDICATION: Metastatic adenocarcinoma, 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 Request made for therapeutic paracentesis. EXAM: ULTRASOUND GUIDED THERAPEUTIC PARACENTESIS MEDICATIONS: 1% lidocaine local COMPLICATIONS: None immediate. PROCEDURE: Informed written consent was obtained from the patient after a discussion of the risks, benefits and alternatives to treatment. A timeout was performed prior to the initiation of the procedure. Initial ultrasound scanning demonstrates a small amount of ascites within the right mid to lower abdominal quadrant. The right mid to lower abdomen was prepped and draped in the usual sterile fashion. 1% lidocaine was used for local anesthesia. Following this, a Yueh catheter was introduced. An ultrasound image was saved for documentation purposes. The paracentesis was performed. The catheter was removed and a dressing was applied. The patient tolerated the procedure well without immediate post procedural complication. FINDINGS: A total of approximately 920 cc of slightly hazy, amber fluid was removed. IMPRESSION: Successful ultrasound-guided therapeutic paracentesis yielding 920 cc of peritoneal fluid. Read by: Rowe Robert, PA-C Electronically Signed   By: Jerilynn Mages.  Shick M.D.   On: 02/24/2017 16:37       ASSESSMENT and PLAN  Acute respiratory failure with hypoxia (HCC) Currently mild acute hypoxemic resp failure. Etiology: metastatic nodular disease and lymphangitis. Will need to rule out CHF. No evidence of infection. No indication for BiPAP currently  Plan o2 for pulse ox > 88% Can use opioids for dyspnea relief  if worse No role for BIPAP currently (not sick enough) DNR/DNI -agree Lasix x 20mg  x 1 IV empiric Repeat cxr port Get echo and depending on results - consider furhter diuresis Monitor  If lymphangitis related, doubt this can be fixed  Electrolyte imbalance Mag < 2 Low phios +  Plan Replete both      FAMILY  - Updates: 02/26/2017 --> daughter at bedside  - Inter-disciplinary family meet or Palliative Care meeting due by:  DAy 7. Current LOS is LOS 12 days  CODE STATUS    Code Status Orders  (From admission, onward)        Start     Ordered   02/26/17 0805  Do not attempt resuscitation (DNR)  Continuous    Question Answer Comment  In the event of cardiac or respiratory ARREST Do not call a "code blue"   In the event of cardiac or respiratory ARREST Do not perform Intubation, CPR, defibrillation or ACLS   In the event of cardiac or respiratory ARREST Use medication by any route, position, wound care, and other measures to relive pain and suffering. May use oxygen, suction and manual treatment of airway obstruction as needed for comfort.      02/26/17 0805    Code Status History    Date Active Date Inactive Code Status Order ID Comments User Context   02/14/2017 00:35 02/26/2017 08:05 Full Code [id number]  Damita Lack, MD ED        DISPO Keep in  Bed 1334.   Overall very little for pulmonary to add but will follow again 02/27/17   Dr. Brand Males, M.D., [facility].C.P Pulmonary and Critical Care Medicine Staff Physician, [place] Director - Interstitial Lung Disease  Program  Pulmonary [place] [facility] [place], [place], [address]  Pager: [phone], If no answer or between  15:00h - 7:00h: call  [place] Telephone: [phone]

## 2017-02-26 NOTE — Progress Notes (Signed)
Physical Therapy Treatment Patient Details Name: Jessica Gregory MRN: [id number] DOB: 09/16/1950 Today's Date: 02/26/2017    History of Present Illness 67 y.o. female with medical history significant of history of diabetes mellitus type 2, hyperlipidemia was sent to the ER for further evaluation due to abnormal CT scan and and patient feeling generally ill.  Patient has had abdominal discomfort for past couple of months. 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 Has had 1 diagnostic paracentsis and one pallaitive paracentesis thus far    PT Comments    Patient progressing some with ambulation (family also report up walking earlier today).  Stable up in bathroom with distance supervision.  Family providing appropriate care for her.  No concerns about d/c as should have plenty of support.     Follow Up Recommendations  Supervision/Assistance - 24 hour;No PT follow up     Equipment Recommendations  Rolling walker with 5" wheels(youth walker)    Recommendations for Other Services       Precautions / Restrictions Precautions Precautions: Fall Precaution Comments: monitor O2 sats     Mobility  Bed Mobility               General bed mobility comments: up in recliner  Transfers Overall transfer level: Needs assistance Equipment used: Rolling walker (2 wheeled) Transfers: Sit to/from Stand Sit to Stand: Min assist         General transfer comment: cues for hand placement, assist for standing  Ambulation/Gait Ambulation/Gait assistance: Min guard Ambulation Distance (Feet): 150 Feet Assistive device: Rolling walker (2 wheeled) Gait Pattern/deviations: Step-through pattern;Decreased stride length;Shuffle;Trunk flexed     General Gait Details: cues to stay inside walker, for posture   Stairs            Wheelchair Mobility    Modified Rankin (Stroke Patients Only)       Balance  Overall balance assessment: Needs assistance   Sitting balance-Leahy Scale: Good     Standing balance support: No upper extremity supported;During functional activity Standing balance-Leahy Scale: Fair Standing balance comment: adjusts clothing in bathroom and washing hands                            Cognition Arousal/Alertness: Awake/alert Behavior During Therapy: WFL for tasks assessed/performed Overall Cognitive Status: Difficult to assess                                 General Comments: appears WFL. family present to translate      Exercises      General Comments General comments (skin integrity, edema, etc.): daughters present to translate and assist with IV/O2      Pertinent Vitals/Pain Faces Pain Scale: Hurts even more Pain Location: abdomen Pain Descriptors / Indicators: Grimacing;Guarding Pain Intervention(s): Monitored during session;PCA encouraged    Home Living                      Prior Function            PT Goals (current goals can now be found in the care plan section) Progress towards PT goals: Progressing toward goals    Frequency    Min 3X/week      PT Plan Current plan remains appropriate    Co-evaluation  AM-PAC PT "6 Clicks" Daily Activity  Outcome Measure  Difficulty turning over in bed (including adjusting bedclothes, sheets and blankets)?: A Lot Difficulty moving from lying on back to sitting on the side of the bed? : Unable Difficulty sitting down on and standing up from a chair with arms (e.g., wheelchair, bedside commode, etc,.)?: Unable Help needed moving to and from a bed to chair (including a wheelchair)?: A Little Help needed walking in hospital room?: A Little Help needed climbing 3-5 steps with a railing? : A Lot 6 Click Score: 12    End of Session Equipment Utilized During Treatment: Gait belt;Oxygen Activity Tolerance: Patient tolerated treatment well Patient  left: Other (comment)(with daughter in bathroom for bathing)   PT Visit Diagnosis: Muscle weakness (generalized) (M62.81);Difficulty in walking, not elsewhere classified (R26.2);Pain Pain - part of body: (abdomen)     Time: 4383-8184 PT Time Calculation (min) (ACUTE ONLY): 21 min  Charges:  $Gait Training: 8-22 mins                    G CodesMagda Gregory, [place] [phone] 02/26/2017    Jessica Gregory 02/26/2017, 4:12 PM

## 2017-02-26 NOTE — Progress Notes (Signed)
It is questionable whether pts pain is well controlled. Called the daughter this evening to speak with her mother about the pain. Pt. Said pain was better than it was in the ED, but remains around an 8. Pt told daughter the PCA dose works, but wears off quickly.

## 2017-02-26 NOTE — Assessment & Plan Note (Addendum)
Mg improved following supplementation PO4 remains low  Plan Replace as indicated Repeat in am

## 2017-02-26 NOTE — Progress Notes (Signed)
IP PROGRESS NOTE  Subjective:  Patient had some nausea and vomiting yesterday.  Increasing weakness and inability to ambulate due to concerns for falling.  Overnight, pain control has been suboptimal with pain running between 7-8/10 despite morphine PCA.  Objective: Vital signs in last 24 hours: Blood pressure 105/66, pulse 98, temperature 98.2 F (36.8 C), temperature source Oral, resp. rate 13, height 4' 10.5" (1.486 m), weight 134 lb 0.6 oz (60.8 kg), SpO2 97 %.  Intake/Output from previous day: 01/29 0701 - 01/30 0700 In: 241 [P.O.:240; I.V.:1] Out: -   Physical Exam: Patient appears generally ill, somnolent likely due to recently administered morphine.    Tachypneic this morning HEENT: Anicteric sclera, moist mucous membranes Lungs: Decreased breath sounds bilaterally with expiratory wheezing and frequent nonproductive cough Cardiac: S1/S2, regular, no murmurs Abdomen: Distended, full, diffusely tender, no significant worsening since yesterday.  Positive bowel sounds Lymph nodes: No palpable lymphadenopathy in the cervical, supraclavicular, axillary, or inguinal regions Neurologic: No gross focal neurological deficits Skin: No rash, petechiae, or ecchymosis. Musculoskeletal: Bilateral lower extremity swelling.   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 Request made for therapeutic paracentesis. EXAM: ULTRASOUND GUIDED THERAPEUTIC PARACENTESIS MEDICATIONS: 1% lidocaine local COMPLICATIONS:  None immediate. PROCEDURE: Informed written consent was obtained from the patient after a discussion of the risks, benefits and alternatives to treatment. A timeout was performed prior to the initiation of the procedure. Initial ultrasound scanning demonstrates a small amount of ascites within the right mid to lower abdominal quadrant. The right mid to lower abdomen was prepped and draped in the usual sterile fashion. 1% lidocaine was used for local anesthesia. Following this, a Yueh catheter was introduced. An ultrasound image was saved for documentation purposes. The paracentesis was performed. The catheter was removed and a dressing was applied. The patient tolerated the procedure well without immediate post procedural complication. FINDINGS: A total of approximately 920 cc of slightly hazy, amber fluid was removed. IMPRESSION: Successful ultrasound-guided therapeutic paracentesis yielding 920 cc of peritoneal fluid. Read by: Rowe Robert, PA-C Electronically Signed   By: Jerilynn Mages.  Shick M.D.   On: 02/24/2017 16:37    Medications: I have reviewed the patient's current medications.  Assessment/Plan: 67 year old female who presented with abdominal pain and partial bowel obstruction with findings of widely metastatic malignancy including peritoneal carcinomatosis, hepatic lesions, and multifocal bilateral pulmonary involvement.  Peritoneal fluid positive for presence of cells consistent with adenocarcinoma.    **Respiratory failure, mixed hypoxemic/hypercarbic: Respiratory rate increased this morning up to 22 oh with concurrent rise and bicarbonate in the peripheral blood on a.m. labs up to 40.  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  Nevertheless, distribution of the disease as well as elevation of CA 125 and normal CA19-9 with noncontributory immunohistochemical profile favor primary peritoneal or ovarian etiology. Received the initial chemotherapy with carboplatin AUC 5 + paclitaxel 125 mg/m Q21d on 02/21/17.  No evidence of tumor lysis syndrome so far.  Previously noted hyperkalemia might of been attributable to the spontaneous tumor lysis or renal dysfunction due to ascites. 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  Will reaccumulate rapidly until the cancer is addressed. Paracentesis 02/21/17, 02/24/17.  Persistent significant pain in the abdomen with increased bowel output.  Increasing pain is more likely to be associated with increased bowel mobility. --We will abstain from additional paracentesis at this point in time unless abdominal distention increases significantly.  **Cancer-associated pain: Patient seems to be able to stay awake enough at this current medication levels with no uncontrolled pain. Pain control inadequate --Current pain regimen:     --Morphine PCA pump: 2mg /hr basal rate + 2mg  IV Q38min PRN;  --Continue monitoring daily requirement for morphine and gradually increase basal rate as needed balancing the respiratory function, pain control, and mental state  **Diabetes mellitus: As expected, glucose is higher and now that patient has received glucocorticoids with her chemotherapy. --Continue SSI for now, may have to increase to higher grade scale  **Malnutrition, combined calorie/protein,  severe: Severe decrease in oral intake due to peritoneal carcinomatosis and partial bowel obstruction with pain exacerbated by food intake.  Patient has been seen by dietitian.  Phosphorus level has stabilized, albumin is slightly improved compared to the prior days. --Continue advancing nutrition as possible/tolerable.  **Electrolytes/Fluids:  Hyponatremia: Complex hyponatremia due to intravascular volume depletion, pain, nausea, metastatic disease in the liver.  Improved significantly over the weekend and resolved completely today.  Hypokalemia: Potassium has dropped today likely due to increased urine output and insufficient oral intake.  Replaced with IV yesterday, potassium up to 3.8 today. --Continue volume supplementation carefully due to potential to exacerbate ascites   **PPx:  --Continue pantoprazole for stress ulcer prophylaxis --Holding enoxaparin ppx for now due to need for repeat paracentesis, using mechanical DVT ppx  **Code Status:  --Changed to DNR today -- had extensive discussion with the patient's daughter who has partially translated the conversation to the patient.  Change to DNR is based on anticipated success rate with resuscitation should she had cardiac or pulmonary arrest which is likely less than 1% chance at the expense of extensive, painful, and ultimately futile intervention.  Patient is still willing to undergo supportive care with continued antibiotic treatments as needed, respiratory support including possible planned intubation or noninvasive ventilation.  **Disposition:  --Guarded addition, patient has worsening respiratory failure -- needs to remain hospitalized for pain control, respiratory care, and continued electrolyte management --Transferring to stepdown unit today for ventilatory support --Disposition as not possible at this time     LOS: 12 days   Ardath Sax, MD   02/26/2017, 8:53 AM

## 2017-02-26 NOTE — Assessment & Plan Note (Addendum)
Currently mild acute hypoxemic resp failure. Etiology: metastatic nodular disease and lymphangitis as well as decreased abdominal compliance/abdominal pain resulting in atelectasis. Will need to rule out CHF but doubt this is the etiology of her respiratory dysfunction. No evidence of infection. Remains off BIPAP.  Plan o2 for pulse ox > 88% Agree with opioid use, I think she is close to requiring comfort level care No role for BIPAP currently (not sick enough) DNR/DNI -agree F/u echo  If lymphangitis related, doubt this can be fixed

## 2017-02-27 ENCOUNTER — Inpatient Hospital Stay (HOSPITAL_COMMUNITY): Payer: 59

## 2017-02-27 DIAGNOSIS — I361 Nonrheumatic tricuspid (valve) insufficiency: Secondary | ICD-10-CM

## 2017-02-27 DIAGNOSIS — J9602 Acute respiratory failure with hypercapnia: Secondary | ICD-10-CM

## 2017-02-27 LAB — COMPREHENSIVE METABOLIC PANEL
ALBUMIN: 2.4 g/dL — AB (ref 3.5–5.0)
ALT: 29 U/L (ref 14–54)
ANION GAP: 12 (ref 5–15)
AST: 30 U/L (ref 15–41)
Alkaline Phosphatase: 60 U/L (ref 38–126)
BILIRUBIN TOTAL: 0.6 mg/dL (ref 0.3–1.2)
BUN: 15 mg/dL (ref 6–20)
CHLORIDE: 87 mmol/L — AB (ref 101–111)
CO2: 37 mmol/L — AB (ref 22–32)
Calcium: 8 mg/dL — ABNORMAL LOW (ref 8.9–10.3)
Creatinine, Ser: 0.51 mg/dL (ref 0.44–1.00)
GFR calc Af Amer: >60 mL/min (ref >60)
GFR calc non Af Amer: >60 mL/min (ref >60)
GLUCOSE: 229 mg/dL — AB (ref 65–99)
POTASSIUM: 3.8 mmol/L (ref 3.5–5.1)
SODIUM: 136 mmol/L (ref 135–145)
TOTAL PROTEIN: 5.2 g/dL — AB (ref 6.5–8.1)

## 2017-02-27 LAB — URIC ACID: URIC ACID, SERUM: 2.9 mg/dL (ref 2.3–6.6)

## 2017-02-27 LAB — CBC WITH DIFFERENTIAL/PLATELET
BASOS ABS: 0 10*3/uL (ref 0.0–0.1)
BASOS PCT: 0 %
EOS ABS: 0 10*3/uL (ref 0.0–0.7)
Eosinophils Relative: 1 %
HCT: 35.5 % — ABNORMAL LOW (ref 36.0–46.0)
Hemoglobin: 11 g/dL — ABNORMAL LOW (ref 12.0–15.0)
Lymphocytes Relative: 9 %
Lymphs Abs: 0.5 10*3/uL — ABNORMAL LOW (ref 0.7–4.0)
MCH: 25.6 pg — ABNORMAL LOW (ref 26.0–34.0)
MCHC: 31 g/dL (ref 30.0–36.0)
MCV: 82.6 fL (ref 78.0–100.0)
MONO ABS: 0 10*3/uL — AB (ref 0.1–1.0)
MONOS PCT: 1 %
NEUTROS ABS: 5.1 10*3/uL (ref 1.7–7.7)
Neutrophils Relative %: 89 %
PLATELETS: 135 10*3/uL — AB (ref 150–400)
RBC: 4.3 MIL/uL (ref 3.87–5.11)
RDW: 14 % (ref 11.5–15.5)
WBC: 5.6 10*3/uL (ref 4.0–10.5)

## 2017-02-27 LAB — ECHOCARDIOGRAM COMPLETE
HEIGHTINCHES: 58.5 in
WEIGHTICAEL: 2144 [oz_av]

## 2017-02-27 LAB — GLUCOSE, CAPILLARY
GLUCOSE-CAPILLARY: 148 mg/dL — AB (ref 65–99)
Glucose-Capillary: 202 mg/dL — ABNORMAL HIGH (ref 65–99)
Glucose-Capillary: 291 mg/dL — ABNORMAL HIGH (ref 65–99)

## 2017-02-27 LAB — MAGNESIUM: Magnesium: 2 mg/dL (ref 1.7–2.4)

## 2017-02-27 LAB — PHOSPHORUS: Phosphorus: 2.1 mg/dL — ABNORMAL LOW (ref 2.5–4.6)

## 2017-02-27 MED ORDER — FUROSEMIDE 10 MG/ML IJ SOLN
20.0000 mg | Freq: Every day | INTRAMUSCULAR | Status: DC
  Administered 2017-02-28: 20 mg via INTRAVENOUS
  Filled 2017-02-27: qty 2

## 2017-02-27 NOTE — Progress Notes (Signed)
PULMONARY / CRITICAL CARE MEDICINE   Name: Jessica Gregory MRN: [id number] DOB: [birth year] PCP Forrest Moron, MD LOS 13 as of 02/27/2017     ADMISSION DATE:  02/22/2017 CONSULTATION DATE:  02/27/2017   REFERRING MD:  Dr Lebron Conners of Oncology  CHIEF COMPLAINT:  Acute resp failure with hypoxemia  HISTORY OF PRESENT ILLNESS:   History is provided by the oncologist, review of the chart and the daughter at the bedside.  Patient does not speak English  Jessica Gregory is a 67 year old [place] female with previous history of type 2 diabetes, hyperlipidemia.  Apparently she has been having some abdominal discomfort for a few months.  She had a CT scan of the abdomen at an urgent care setting which showed ascites with widespread metastatic disease.  She was admitted with dyspnea, fever and some nonproductive cough.  This was associated with poor appetite.  At admission February 14, 2017 she had a CT Joetta Manners chest that ruled out pulmonary embolism but showed diffuse widespread nodular metastatic disease.  Consult has been called because of hypoxemia and patient requiring oxygen.  According to the daughter patient has been on oxygen ever since admission.  According to the nurse patient is stable on 2 L of oxygen ever since admission.  The main issue is abdominal pain for which she gets pain for which she gets paracentesis and opioids.  Patient is currently been made a DNR/DNI but full medical care.  She status post chemotherapy approximately 1 week ago.  Overall she is frail and debilitated.  Pulmonary has been consulted because of hypoxemia   Subjective Mostly c/o abd pain Anxious and possibly hallucinating at times per her daughter.   VITAL SIGNS: BP 94/63 (BP Location: Right Arm)   Pulse 92   Temp 98 F (36.7 C) (Oral)   Resp 19   Ht 4' 10.5" (1.486 m)   Wt 134 lb 0.6 oz (60.8 kg)   SpO2 98%   BMI 27.54 kg/m    INTAKE / OUTPUT:  EXAM General: This is a 67 year old female she is  chronically ill appearing, currently in some distress presumably from abdominal pain HEENT: Normocephalic atraumatic no jugular venous distention mucous membranes moist. Pulmonary: Decreased bases, no accessory use Cardiac: Regular rate and rhythm Neuro: Non-English-speaking, moves all extremities, anxious and restless in the bed.  Moaning, holding onto her abdomen Abdomen: Soft, tender, hypoactive bowel sounds Extremities: Brisk cap remarkable for lower extremity edema.  LABS  PULMONARY Recent Labs  Lab 02/26/17 0752  PHART 7.422  PCO2ART 65.0*  PO2ART 73.3*  HCO3 41.6*  O2SAT 94.5    CBC Recent Labs  Lab 02/25/17 0448 02/26/17 0506 02/27/17 0745  HGB 12.0 11.8* 11.0*  HCT 38.4 39.3 35.5*  WBC 13.0* 11.2* 5.6  PLT 200 175 135*    COAGULATION No results for input(s): INR in the last 168 hours.  CARDIAC  No results for input(s): TROPONINI in the last 168 hours. No results for input(s): PROBNP in the last 168 hours.   CHEMISTRY Recent Labs  Lab 02/23/17 0430  02/24/17 0520 02/24/17 1845 02/25/17 0448 02/26/17 0506 02/27/17 0745  NA 135  --  137  --  136 136 136  K 3.9  --  3.8  --  3.1* 3.8 3.8  CL 92*  --  90*  --  86* 86* 87*  CO2 32  --  37*  --  37* 40* 37*  GLUCOSE 216*  --  222*  --  177* 174* 229*  BUN 27*  --  19  --  16 13 15   CREATININE 0.58  --  0.48  --  0.49 0.46 0.51  CALCIUM 8.2*  --  8.3*  --  8.3* 8.1* 8.0*  MG 2.2   < > 1.9 1.9 1.8 1.8 2.0  PHOS 2.2*   < > 2.3* 1.5* 1.7* 2.4* 2.1*   < > = values in this interval not displayed.   Estimated Creatinine Clearance: 54.9 mL/min (by C-G formula based on SCr of 0.51 mg/dL).   LIVER Recent Labs  Lab 02/23/17 0430 02/24/17 0520 02/25/17 0448 02/26/17 0506 02/27/17 0745  AST 38 36 35 35 30  ALT 26 31 31  32 29  ALKPHOS 66 63 61 59 60  BILITOT 0.8 0.7 0.7 0.7 0.6  PROT 5.7* 5.7* 5.8* 5.3* 5.2*  ALBUMIN 2.3* 2.4* 2.9* 2.5* 2.4*     INFECTIOUS Recent Labs  Lab 02/26/17 1145   LATICACIDVEN 1.1     ENDOCRINE CBG (last 3)  Recent Labs    02/26/17 1630 02/26/17 2052 02/27/17 0723  GLUCAP 168* 135* 202*      IMAGING x48h  - image(s) personally visualized  -   highlighted in bold Dg Chest Port 1 View  Result Date: 02/26/2017 CLINICAL DATA:  Shortness of breath. EXAM: PORTABLE CHEST 1 VIEW COMPARISON:  Radiographs of February 18, 2017. FINDINGS: The heart size and mediastinal contours are within normal limits. No pneumothorax is noted. Interval placement of left-sided PICC line with distal tip in expected position of cavoatrial junction. Stable bilateral patchy airspace opacities are noted in both lungs, particularly on the right, concerning for pneumonia. Small right pleural effusion is noted. The visualized skeletal structures are unremarkable. IMPRESSION: Stable bilateral patchy airspace opacities are noted concerning for multifocal pneumonia. Small right pleural effusion is noted. Interval placement of left-sided PICC line with distal tip in expected position of cavoatrial junction. Electronically Signed   By: Marijo Conception, M.D.   On: 02/26/2017 10:35   ASSESSMENT and PLAN  Acute respiratory failure (HCC) Currently mild acute hypoxemic resp failure. Etiology: metastatic nodular disease and lymphangitis as well as decreased abdominal compliance/abdominal pain resulting in atelectasis. Will need to rule out CHF but doubt this is the etiology of her respiratory dysfunction. No evidence of infection. Remains off BIPAP.  Plan o2 for pulse ox > 88% Agree with opioid use, I think she is close to requiring comfort level care No role for BIPAP currently (not sick enough) DNR/DNI -agree F/u echo  If lymphangitis related, doubt this can be fixed  Electrolyte imbalance Mg improved following supplementation PO4 remains low  Plan Replace as indicated Repeat in am    FAMILY  - Updates: 02/27/2017 --> daughter at bedside  - Inter-disciplinary family meet or  Palliative Care meeting due by:  DAy 7. Current LOS is LOS 13 days  CODE STATUS    Code Status Orders  (From admission, onward)        Start     Ordered   02/26/17 0805  Do not attempt resuscitation (DNR)  Continuous    Question Answer Comment  In the event of cardiac or respiratory ARREST Do not call a "code blue"   In the event of cardiac or respiratory ARREST Do not perform Intubation, CPR, defibrillation or ACLS   In the event of cardiac or respiratory ARREST Use medication by any route, position, wound care, and other measures to relive pain and suffering. May use oxygen, suction and manual treatment  of airway obstruction as needed for comfort.      02/26/17 0805    Code Status History    Date Active Date Inactive Code Status Order ID Comments User Context   02/14/2017 00:35 02/26/2017 08:05 Full Code [id number]  Damita Lack, MD ED        DISPO Critical care will sign off.  There is nothing for [place] to offer here.  The primary focus should be her pain management.  Oncology is well equipped to deal with this.  I do not think she has a candidate for noninvasive positive pressure ventilation, and think the priority of her focus should be on her abdominal pain.  Erick Colace ACNP-BC [place] Pager # [phone] OR # [phone] if no answer

## 2017-02-27 NOTE — Progress Notes (Signed)
IP PROGRESS NOTE  Subjective:  Patient complains of persistent abdominal pain, 8/10 at this time.  Patient still forgets to use her PCA button.  Denies respiratory discomfort at this time.  Objective: Vital signs in last 24 hours: Blood pressure 94/63, pulse 92, temperature 98 F (36.7 C), temperature source Oral, resp. rate 19, height 4' 10.5" (1.486 m), weight 134 lb 0.6 oz (60.8 kg), SpO2 98 %.  Intake/Output from previous day: 01/30 0701 - 01/31 0700 In: 50 [P.O.:60] Out: -   Physical Exam: Patient appears generally ill, sitting up in her bed, appears somewhat uncomfortable, but without respiratory extremis.    HEENT: Anicteric sclera, moist mucous membranes Lungs: Decreased breath sounds bilaterally with expiratory wheezing and frequent nonproductive cough Cardiac: S1/S2, regular, no murmurs Abdomen: Distended, full, diffusely tender, no significant worsening since yesterday.  Positive bowel sounds Lymph nodes: No palpable lymphadenopathy in the cervical, supraclavicular, axillary, or inguinal regions Neurologic: No gross focal neurological deficits Skin: No rash, petechiae, or ecchymosis. Musculoskeletal: Bilateral lower extremity swelling.   Lab Results: Recent Labs    02/25/17 0448 02/26/17 0506  WBC 13.0* 11.2*  HGB 12.0 11.8*  HCT 38.4 39.3  PLT 200 175    BMET Recent Labs    02/25/17 0448 02/26/17 0506  NA 136 136  K 3.1* 3.8  CL 86* 86*  CO2 37* 40*  GLUCOSE 177* 174*  BUN 16 13  CREATININE 0.49 0.46  CALCIUM 8.3* 8.1*    Lab Results  Component Value Date   CEA1 0.9 02/19/2017    Studies/Results: Dg Chest Port 1 View  Result Date: 02/26/2017 CLINICAL DATA:  Shortness of breath. EXAM: PORTABLE CHEST 1 VIEW COMPARISON:  Radiographs of February 18, 2017. FINDINGS: The heart size and mediastinal contours are within normal limits. No pneumothorax is noted. Interval placement of left-sided PICC line with distal tip in expected position of cavoatrial  junction. Stable bilateral patchy airspace opacities are noted in both lungs, particularly on the right, concerning for pneumonia. Small right pleural effusion is noted. The visualized skeletal structures are unremarkable. IMPRESSION: Stable bilateral patchy airspace opacities are noted concerning for multifocal pneumonia. Small right pleural effusion is noted. Interval placement of left-sided PICC line with distal tip in expected position of cavoatrial junction. Electronically Signed   By: Marijo Conception, M.D.   On: 02/26/2017 10:35    Medications: I have reviewed the patient's current medications.  Assessment/Plan: 67 year old female who presented with abdominal pain and partial bowel obstruction with findings of widely metastatic malignancy including peritoneal carcinomatosis, hepatic lesions, and multifocal bilateral pulmonary involvement.  Peritoneal fluid positive for presence of cells consistent with adenocarcinoma.    **Respiratory failure mixed hypoxemic/hypercarbic: Likely due to combination of malignant infiltration of the lungs including lung infiltration by underlying malignancy including lymphangitic spread, pleural effusion secondary to the malignant ascites, and effects of significant levels of opioid medications delivered for pain control.  Seen and evaluated by pulmonology yesterday.  Recommendations and input are greatly appreciated. 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  Nevertheless, distribution of the disease as well as elevation of CA 125 and normal CA19-9 with noncontributory immunohistochemical profile favor primary peritoneal or ovarian etiology. Received the initial chemotherapy with carboplatin AUC 5 +  paclitaxel 125 mg/m Q21d on 02/21/17.  No evidence of tumor lysis  syndrome so far.  Previously noted hyperkalemia might of been attributable to the spontaneous tumor lysis or renal dysfunction due to ascites. 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  Will reaccumulate rapidly until the cancer is addressed. Paracentesis 02/21/17, 02/24/17.  Persistent significant pain in the abdomen with increased bowel output.  Increasing pain is more likely to be associated with increased bowel mobility. --We will abstain from additional paracentesis at this point in time unless abdominal distention increases significantly.  **Cancer-associated pain: Patient seems to be able to stay awake enough at this current medication levels with no uncontrolled pain. Pain control inadequate, but it is not clear howe much morphine has been delivered via the PCA over the past 24hrs as the record does not match the expected basal infusion rates  --Current pain regimen:     --Morphine PCA pump: 2mg /hr basal rate + 2mg  IV Q22min PRN;  --Continue monitoring daily requirement for morphine and gradually increase basal rate as needed balancing the respiratory function, pain control, and mental state  **Diabetes mellitus: As expected, glucose is higher and now that patient has received glucocorticoids with her chemotherapy. --Continue SSI for now, may have to increase to higher grade scale  **Malnutrition, combined calorie/protein, severe: Severe decrease in oral intake due to peritoneal carcinomatosis and partial bowel obstruction with pain exacerbated by food intake.  Patient has been seen by dietitian.  Phosphorus level has stabilized, albumin is slightly improved compared to the prior days. --Continue advancing nutrition as  possible/tolerable.  **Electrolytes/Fluids:  Hyponatremia: Complex hyponatremia due to intravascular volume depletion, pain, nausea, metastatic disease in the liver.  Improved significantly over the weekend and resolved completely today.  Hypokalemia: Potassium has dropped likely due to increased urine output and insufficient oral intake.  Replaced with IV KCl/KPhos, potassium improved up to 3.8 yesterday. --Continue volume supplementation carefully due to potential to exacerbate ascites   **PPx:  --Continue pantoprazole for stress ulcer prophylaxis --Holding enoxaparin ppx for now due to need for repeat paracentesis, using mechanical DVT ppx  **Code Status:  --DNR/DNI  **Disposition:  --Guarded due to combination of respiratory failure, uncontrolled pain and metastatic malignancy --Disposition as not possible at this time     LOS: 13 days   Ardath Sax, MD   02/27/2017, 7:24 AM

## 2017-02-27 NOTE — Progress Notes (Signed)
  Echocardiogram 2D Echocardiogram has been performed.  Merrie Roof F 02/27/2017, 2:25 PM

## 2017-02-28 DIAGNOSIS — E878 Other disorders of electrolyte and fluid balance, not elsewhere classified: Secondary | ICD-10-CM

## 2017-02-28 LAB — COMPREHENSIVE METABOLIC PANEL
ALT: 34 U/L (ref 14–54)
AST: 35 U/L (ref 15–41)
Albumin: 2.4 g/dL — ABNORMAL LOW (ref 3.5–5.0)
Alkaline Phosphatase: 62 U/L (ref 38–126)
Anion gap: 8 (ref 5–15)
BUN: 11 mg/dL (ref 6–20)
CHLORIDE: 85 mmol/L — AB (ref 101–111)
CO2: 41 mmol/L — AB (ref 22–32)
CREATININE: 0.47 mg/dL (ref 0.44–1.00)
Calcium: 7.7 mg/dL — ABNORMAL LOW (ref 8.9–10.3)
GFR calc non Af Amer: >60 mL/min (ref >60)
Glucose, Bld: 189 mg/dL — ABNORMAL HIGH (ref 65–99)
POTASSIUM: 3.7 mmol/L (ref 3.5–5.1)
SODIUM: 134 mmol/L — AB (ref 135–145)
Total Bilirubin: 0.6 mg/dL (ref 0.3–1.2)
Total Protein: 5.3 g/dL — ABNORMAL LOW (ref 6.5–8.1)

## 2017-02-28 LAB — GLUCOSE, CAPILLARY
GLUCOSE-CAPILLARY: 135 mg/dL — AB (ref 65–99)
Glucose-Capillary: 177 mg/dL — ABNORMAL HIGH (ref 65–99)
Glucose-Capillary: 152 mg/dL — ABNORMAL HIGH (ref 65–99)
Glucose-Capillary: 238 mg/dL — ABNORMAL HIGH (ref 65–99)

## 2017-02-28 LAB — CBC WITH DIFFERENTIAL/PLATELET
BASOS ABS: 0 10*3/uL (ref 0.0–0.1)
Basophils Relative: 0 %
EOS ABS: 0 10*3/uL (ref 0.0–0.7)
Eosinophils Relative: 1 %
HCT: 36.1 % (ref 36.0–46.0)
Hemoglobin: 11.2 g/dL — ABNORMAL LOW (ref 12.0–15.0)
Lymphocytes Relative: 15 %
Lymphs Abs: 0.6 10*3/uL — ABNORMAL LOW (ref 0.7–4.0)
MCH: 25.5 pg — ABNORMAL LOW (ref 26.0–34.0)
MCHC: 31 g/dL (ref 30.0–36.0)
MCV: 82.2 fL (ref 78.0–100.0)
Monocytes Absolute: 0.1 10*3/uL (ref 0.1–1.0)
Monocytes Relative: 3 %
Neutro Abs: 3.1 10*3/uL (ref 1.7–7.7)
Neutrophils Relative %: 81 %
PLATELETS: 143 10*3/uL — AB (ref 150–400)
RBC: 4.39 MIL/uL (ref 3.87–5.11)
RDW: 13.8 % (ref 11.5–15.5)
WBC: 3.8 10*3/uL — AB (ref 4.0–10.5)

## 2017-02-28 LAB — MAGNESIUM: MAGNESIUM: 1.8 mg/dL (ref 1.7–2.4)

## 2017-02-28 LAB — PHOSPHORUS: PHOSPHORUS: 2.2 mg/dL — AB (ref 2.5–4.6)

## 2017-02-28 LAB — URIC ACID: URIC ACID, SERUM: 2.5 mg/dL (ref 2.3–6.6)

## 2017-02-28 NOTE — Progress Notes (Signed)
IP PROGRESS NOTE  Subjective:  Patient reports better pain control overnight, was able to get some more sleep compared to previous nights.  Yesterday, ambulated 3 times with assistance of her family.  This morning, had a bout of acute nausea and vomited x1 without hematemesis.  Continues to have bowel movements with the last bowel movement earlier today.  Objective: Vital signs in last 24 hours: Blood pressure 127/68, pulse 89, temperature (!) 97.5 F (36.4 C), temperature source Oral, resp. rate 15, height 4' 10.5" (1.486 m), weight 134 lb (60.8 kg), SpO2 97 %.  Intake/Output from previous day: No intake/output data recorded.  Physical Exam: Patient appears generally ill, sitting up in her bed, appears somewhat uncomfortable, but without respiratory extremis.    HEENT: Anicteric sclera, moist mucous membranes Lungs: Decreased breath sounds bilaterally with expiratory wheezing and frequent nonproductive cough Cardiac: S1/S2, regular, no murmurs Abdomen: Stable to mildly improved abdominal distention.  Abdomen is soft and significantly less tender than on the preceding days. Lymph nodes: No palpable lymphadenopathy in the cervical, supraclavicular, axillary, or inguinal regions Neurologic: No gross focal neurological deficits Skin: No rash, petechiae, or ecchymosis. Musculoskeletal: Bilateral lower extremity swelling.   Lab Results: Recent Labs    02/27/17 0745 02/28/17 0500  WBC 5.6 3.8*  HGB 11.0* 11.2*  HCT 35.5* 36.1  PLT 135* 143*    BMET Recent Labs    02/27/17 0745 02/28/17 0500  NA 136 134*  K 3.8 3.7  CL 87* 85*  CO2 37* 41*  GLUCOSE 229* 189*  BUN 15 11  CREATININE 0.51 0.47  CALCIUM 8.0* 7.7*    Lab Results  Component Value Date   CEA1 0.9 02/19/2017    Studies/Results: Dg Chest Port 1 View  Result Date: 02/26/2017 CLINICAL DATA:  Shortness of breath. EXAM: PORTABLE CHEST 1 VIEW COMPARISON:  Radiographs of February 18, 2017. FINDINGS: The heart size  and mediastinal contours are within normal limits. No pneumothorax is noted. Interval placement of left-sided PICC line with distal tip in expected position of cavoatrial junction. Stable bilateral patchy airspace opacities are noted in both lungs, particularly on the right, concerning for pneumonia. Small right pleural effusion is noted. The visualized skeletal structures are unremarkable. IMPRESSION: Stable bilateral patchy airspace opacities are noted concerning for multifocal pneumonia. Small right pleural effusion is noted. Interval placement of left-sided PICC line with distal tip in expected position of cavoatrial junction. Electronically Signed   By: Marijo Conception, M.D.   On: 02/26/2017 10:35    Medications: I have reviewed the patient's current medications.  Assessment/Plan: 67 year old female who presented with abdominal pain and partial bowel obstruction with findings of widely metastatic malignancy including peritoneal carcinomatosis, hepatic lesions, and multifocal bilateral pulmonary involvement.  Peritoneal fluid positive for presence of cells consistent with adenocarcinoma.    **Respiratory failure mixed hypoxemic/hypercarbic: Likely due to combination of malignant infiltration of the lungs including lung infiltration by underlying malignancy including lymphangitic spread, pleural effusion secondary to the malignant ascites, and effects of significant levels of opioid medications delivered for pain control.  Seen and evaluated by pulmonology who is recommendations and input are greatly appreciated.  Bicarbonate level 41 today, likely due to increase in opioid delivery yesterday.  Echocardiogram was obtained yesterday demonstrating preserved left ventricular ejection fraction.  Weight remains stable between 02/25/17 and 02/27/17 at 61 kg --Continue daily weights --We will consider additional diuresis if weight is increasing  **Metastatic adenocarcinoma, likely primary peritoneal with liver  and lung metastasis: Differential included  cholangiocarcinoma or primary gallbladder adenocarcinoma based on the dominant lesion in the liver.  Nevertheless, distribution of the disease as well as elevation of CA 125 and normal CA19-9 with noncontributory immunohistochemical profile favor primary peritoneal or ovarian etiology. Received the initial chemotherapy with carboplatin AUC 5 + paclitaxel 125 mg/m Q21d on 02/21/17.  No evidence of tumor lysis syndrome so far.  Previously noted hyperkalemia might of been attributable to the spontaneous tumor lysis or renal dysfunction due to ascites.  Abdominal distention appears to be slightly better today without additional paracentesis.  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  Will reaccumulate rapidly until the cancer is addressed. Paracentesis 02/21/17, 02/24/17.  Persistent significant pain in the abdomen with increased bowel output.  Increasing pain is more likely to be associated with increased bowel mobility. --We will abstain from additional paracentesis at this point in time unless abdominal distention increases significantly.  **Cancer-associated pain: Patient seems to be able to stay awake enough at this current medication levels with no uncontrolled pain.  Pain control improved over the past 24 hours with pain level down to 6/10 compared to 8-9/10 in the days prior.  Patient has used 68 mg of IV morphine over the past 24 hours. --Current pain regimen:     --Morphine PCA pump: 2mg /hr basal rate + 2mg  IV Q33min PRN;  --Continue monitoring daily requirement for morphine and gradually increase basal rate as needed balancing the respiratory function, pain control, and mental state --at this time, increasing rate of  the morphine administration is not advisable considering rise in the bicarbonate level likely indicating increase in carbon dioxide retention due to increased morphine yesterday  **Diabetes mellitus: As expected, glucose is higher and now that patient has received glucocorticoids with her chemotherapy. --Continue SSI for now, may have to increase to higher grade scale  **Malnutrition, combined calorie/protein, severe: Severe decrease in oral intake due to peritoneal carcinomatosis and partial bowel obstruction with pain exacerbated by food intake.  Patient has been seen by dietitian.  Phosphorus level has stabilized, albumin is slightly improved compared to the prior days. --Continue advancing nutrition as possible/tolerable.  **Electrolytes/Fluids:  Hyponatremia: Complex hyponatremia due to intravascular volume depletion, pain, nausea, metastatic disease in the liver.  Slightly worse today, but remains in the mild range.  Possibly due to diuresis yesterday  Hypokalemia: Potassium has dropped likely due to increased urine output and insufficient oral intake.  Replaced with IV KCl/KPhos, potassium improved up to 3.8 yesterday. --Hold volume replacement at this time at a low level   **PPx:  --Continue pantoprazole for stress ulcer prophylaxis --Holding enoxaparin ppx for now due to need for repeat paracentesis, using mechanical DVT ppx  **Code Status:  --Full code: Previously, patient was DNR/DNI, but we had additional conversation yesterday and patient insists on revival to be attempted in case of cardiac or respiratory arrest.  Have explained that her likelihood of successful revival is negligible, likely less than 1% based on her extensive malignancy.  Nevertheless, she does not want to commit to DNR/DNI at this point in time.  **Disposition:  --Guarded due to combination of respiratory failure, uncontrolled pain and metastatic malignancy --Disposition as not possible at this time      LOS: 14 days   Ardath Sax, MD   02/28/2017, 8:36 AM

## 2017-02-28 NOTE — Progress Notes (Signed)
Physical Therapy Treatment Patient Details Name: Jessica Gregory MRN: [id number] DOB: [birth year] Today's Date: 02/28/2017    History of Present Illness 67 y.o. female with medical history significant of history of diabetes mellitus type 2, hyperlipidemia was sent to the ER for further evaluation due to abnormal CT scan and and patient feeling generally ill.  Patient has had abdominal discomfort for past couple of months. 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 Has had 1 diagnostic paracentsis and one pallaitive paracentesis thus far    PT Comments    Progressing with mobility. Family reports pt has been walking 2x/day. Today has been walking the entire unit with them. Pt was able to walk again with therapist. She walked ~500 feet with a RW. Family member mentioned another therapist discussed pt using "another walker". Discussed options possibly being youth height standard RW vs rollator. Brought rollator up to room for them to see and had pt take a few steps in room while using rollator. Unsure, presently, if it will be a good choice since pt has a tendency to keep walker too far ahead (both RW and rollator). Will attempt to practice rollator use on another day. O2 sats >90% on 2L [place] O2 during ambulation.     Follow Up Recommendations  Supervision/Assistance - 24 hour;No PT follow up     Equipment Recommendations  Rolling walker with 5" wheels (youth height) (if family is agreeable)   Recommendations for Other Services       Precautions / Restrictions Precautions Precautions: Fall Precaution Comments: monitor O2 sats  Restrictions Weight Bearing Restrictions: No    Mobility  Bed Mobility               General bed mobility comments: up in recliner  Transfers Overall transfer level: Needs assistance Equipment used: Rolling walker (2 wheeled);4-wheeled walker Transfers: Sit to/from Stand Sit to Stand:  Supervision         General transfer comment: cues for hand placement. supervision for safety, lines.   Ambulation/Gait Ambulation/Gait assistance: Min guard Ambulation Distance (Feet): 500 Feet Assistive device: Rolling walker (2 wheeled) Gait Pattern/deviations: Decreased stride length     General Gait Details: cues to stay inside walker. Noticed RW appears to be too high for pt. Family member stated they raised height due to pt tending to keep flexed posture when walking with them. Close guard for safety   Stairs            Wheelchair Mobility    Modified Rankin (Stroke Patients Only)       Balance                                            Cognition Arousal/Alertness: Awake/alert Behavior During Therapy: WFL for tasks assessed/performed Overall Cognitive Status: Difficult to assess                                 General Comments: appears WFL. family present to translate      Exercises      General Comments        Pertinent Vitals/Pain Pain Assessment: Faces Faces Pain Scale: Hurts a little bit Pain Location: abdomen Pain Intervention(s): Monitored during session;Repositioned    Home Living  Prior Function            PT Goals (current goals can now be found in the care plan section) Progress towards PT goals: Progressing toward goals    Frequency    Min 3X/week      PT Plan Current plan remains appropriate    Co-evaluation              AM-PAC PT "6 Clicks" Daily Activity  Outcome Measure  Difficulty turning over in bed (including adjusting bedclothes, sheets and blankets)?: A Lot Difficulty moving from lying on back to sitting on the side of the bed? : A Lot Difficulty sitting down on and standing up from a chair with arms (e.g., wheelchair, bedside commode, etc,.)?: A Little Help needed moving to and from a bed to chair (including a wheelchair)?: A Little Help  needed walking in hospital room?: A Little Help needed climbing 3-5 steps with a railing? : A Lot 6 Click Score: 15    End of Session Equipment Utilized During Treatment: Oxygen Activity Tolerance: Patient tolerated treatment well Patient left: in chair;with call bell/phone within reach;with family/visitor present   PT Visit Diagnosis: Muscle weakness (generalized) (M62.81);Difficulty in walking, not elsewhere classified (R26.2);Pain     Time: 4166-0630 PT Time Calculation (min) (ACUTE ONLY): 25 min  Charges:  $Gait Training: 23-37 mins                    G Codes:          Weston Anna, MPT Pager: [phone]

## 2017-02-28 DEATH — deceased

## 2017-03-01 LAB — CBC WITH DIFFERENTIAL/PLATELET
BASOS ABS: 0 10*3/uL (ref 0.0–0.1)
Basophils Relative: 0 %
Eosinophils Absolute: 0 10*3/uL (ref 0.0–0.7)
Eosinophils Relative: 0 %
HEMATOCRIT: 35.6 % — AB (ref 36.0–46.0)
Hemoglobin: 11.2 g/dL — ABNORMAL LOW (ref 12.0–15.0)
LYMPHS ABS: 0.5 10*3/uL — AB (ref 0.7–4.0)
LYMPHS PCT: 19 %
MCH: 25.6 pg — ABNORMAL LOW (ref 26.0–34.0)
MCHC: 31.5 g/dL (ref 30.0–36.0)
MCV: 81.5 fL (ref 78.0–100.0)
MONO ABS: 0.4 10*3/uL (ref 0.1–1.0)
Monocytes Relative: 16 %
NEUTROS ABS: 1.8 10*3/uL (ref 1.7–7.7)
Neutrophils Relative %: 65 %
Platelets: 136 10*3/uL — ABNORMAL LOW (ref 150–400)
RBC: 4.37 MIL/uL (ref 3.87–5.11)
RDW: 13.8 % (ref 11.5–15.5)
WBC: 2.7 10*3/uL — ABNORMAL LOW (ref 4.0–10.5)

## 2017-03-01 LAB — COMPREHENSIVE METABOLIC PANEL
ALT: 40 U/L (ref 14–54)
AST: 38 U/L (ref 15–41)
Albumin: 2.3 g/dL — ABNORMAL LOW (ref 3.5–5.0)
Alkaline Phosphatase: 79 U/L (ref 38–126)
Anion gap: 11 (ref 5–15)
BILIRUBIN TOTAL: 1 mg/dL (ref 0.3–1.2)
BUN: 15 mg/dL (ref 6–20)
CO2: 36 mmol/L — ABNORMAL HIGH (ref 22–32)
CREATININE: 0.51 mg/dL (ref 0.44–1.00)
Calcium: 7.7 mg/dL — ABNORMAL LOW (ref 8.9–10.3)
Chloride: 83 mmol/L — ABNORMAL LOW (ref 101–111)
GFR calc Af Amer: >60 mL/min (ref >60)
GFR calc non Af Amer: >60 mL/min (ref >60)
Glucose, Bld: 194 mg/dL — ABNORMAL HIGH (ref 65–99)
Potassium: 3.9 mmol/L (ref 3.5–5.1)
Sodium: 130 mmol/L — ABNORMAL LOW (ref 135–145)
TOTAL PROTEIN: 5.5 g/dL — AB (ref 6.5–8.1)

## 2017-03-01 LAB — PHOSPHORUS: Phosphorus: 2.7 mg/dL (ref 2.5–4.6)

## 2017-03-01 LAB — GLUCOSE, CAPILLARY
GLUCOSE-CAPILLARY: 203 mg/dL — AB (ref 65–99)
GLUCOSE-CAPILLARY: 135 mg/dL — AB (ref 65–99)
Glucose-Capillary: 152 mg/dL — ABNORMAL HIGH (ref 65–99)
Glucose-Capillary: 85 mg/dL (ref 65–99)

## 2017-03-01 LAB — MAGNESIUM: Magnesium: 1.8 mg/dL (ref 1.7–2.4)

## 2017-03-01 LAB — URIC ACID: URIC ACID, SERUM: 3.1 mg/dL (ref 2.3–6.6)

## 2017-03-01 MED ORDER — MORPHINE SULFATE 15 MG PO TABS
15.0000 mg | ORAL_TABLET | ORAL | Status: DC | PRN
  Administered 2017-03-02: 15 mg via ORAL
  Filled 2017-03-01: qty 1

## 2017-03-01 MED ORDER — MORPHINE SULFATE (PF) 2 MG/ML IV SOLN
2.0000 mg | INTRAVENOUS | Status: DC | PRN
  Administered 2017-03-01 – 2017-03-02 (×3): 2 mg via INTRAVENOUS
  Filled 2017-03-01 (×3): qty 1

## 2017-03-01 MED ORDER — MORPHINE SULFATE 2 MG/ML IJ SOLN: 2.0000 mg | INTRAMUSCULAR | Status: DC | PRN

## 2017-03-01 MED ORDER — MORPHINE SULFATE 2 MG/ML IV SOLN: INTRAVENOUS | Status: DC

## 2017-03-01 MED ORDER — MORPHINE SULFATE ER 30 MG PO TBCR
45.0000 mg | EXTENDED_RELEASE_TABLET | Freq: Two times a day (BID) | ORAL | Status: DC
  Administered 2017-03-01 (×2): 45 mg via ORAL
  Filled 2017-03-01 (×2): qty 1

## 2017-03-01 MED ORDER — MORPHINE SULFATE 2 MG/ML IV SOLN: INTRAVENOUS | Status: AC

## 2017-03-01 NOTE — Progress Notes (Signed)
IP PROGRESS NOTE  Subjective:  Significant improvement of abdominal pain overnight with current pain rating between 2 and 4.  No recurrent vomiting.  Patient reports return of appetite this morning, but still only able to eat small amounts of food at a time due to early satiety.  No new respiratory complaints.  Objective: Vital signs in last 24 hours: Blood pressure 118/78, pulse 88, temperature 98 F (36.7 C), temperature source Oral, resp. rate (!) 24, height 4' 10.5" (1.486 m), weight 111 lb 8.8 oz (50.6 kg), SpO2 96 %.  Intake/Output from previous day: 02/01 0701 - 02/02 0700 In: 480 [P.O.:480] Out: -   Physical Exam: Patient appears generally ill, sitting up in the chair eating breakfast, appears significantly more relaxed and comfortable both from respiratory and pain standpoint compared to the previous days.    HEENT: Anicteric sclera, moist mucous membranes Lungs: Decreased breath sounds bilaterally with expiratory wheezing and frequent nonproductive cough Cardiac: S1/S2, regular, no murmurs Abdomen: Abdominal distention appears to be slightly worse than yesterday, no increase in hardness of the abdomen at this time. Lymph nodes: No palpable lymphadenopathy in the cervical, supraclavicular, axillary, or inguinal regions Neurologic: No gross focal neurological deficits Skin: No rash, petechiae, or ecchymosis. Musculoskeletal: Bilateral lower extremity swelling.   Lab Results: Recent Labs    02/28/17 0500 03/01/17 0400  WBC 3.8* 2.7*  HGB 11.2* 11.2*  HCT 36.1 35.6*  PLT 143* 136*    BMET Recent Labs    02/28/17 0500 03/01/17 0400  NA 134* 130*  K 3.7 3.9  CL 85* 83*  CO2 41* 36*  GLUCOSE 189* 194*  BUN 11 15  CREATININE 0.47 0.51  CALCIUM 7.7* 7.7*    Lab Results  Component Value Date   CEA1 0.9 02/19/2017    Studies/Results: No results found.  Medications: I have reviewed the patient's current medications.  Assessment/Plan: 67 year old female  who presented with abdominal pain and partial bowel obstruction with findings of widely metastatic malignancy including peritoneal carcinomatosis, hepatic lesions, and multifocal bilateral pulmonary involvement.  Peritoneal fluid positive for presence of cells consistent with adenocarcinoma.    **Respiratory failure mixed hypoxemic/hypercarbic: Likely due to combination of malignant infiltration of the lungs including lung infiltration by underlying malignancy including lymphangitic spread, pleural effusion secondary to the malignant ascites, and effects of significant levels of opioid medications delivered for pain control.  Seen and evaluated by pulmonology who is recommendations and input are greatly appreciated.  Bicarbonate level down to 36 today, likely due to diuresis with furosemide.  Echocardiogram was obtained 02/27/17 demonstrating preserved left ventricular ejection fraction.  Weight is down to 50.6kg from 60.8kg on 02/27/17 stable from yesterday. 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  Nevertheless, distribution of the disease as well as elevation of CA 125 and normal CA19-9 with noncontributory immunohistochemical profile favor primary peritoneal or ovarian etiology. Received the initial chemotherapy with carboplatin AUC 5 + paclitaxel 125 mg/m Q21d on 02/21/17.  No evidence of tumor lysis syndrome so far.  Previously noted hyperkalemia might of been attributable to the spontaneous tumor lysis or renal dysfunction due to ascites.  Lack of nausea, improving pain, and appearance of appetite are very encouraging as signs of response to systemic therapy. 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  Will reaccumulate rapidly until the cancer is addressed. Paracentesis 02/21/17, 02/24/17.  Persistent significant pain in the abdomen with increased bowel output.  Increasing pain is more likely to be associated with increased bowel mobility. --We will abstain from additional paracentesis at this point in time unless abdominal distention increases significantly.  **Cancer-associated pain: Patient seems to be able to stay awake enough at this current medication levels with no uncontrolled pain.  Pain control improved over the past 24 hours with pain level down to 2-4/10 compared to 8-9/10 in the days prior.  Patient has used 54mg  of IV morphine over the past 24 hours, compared to 68mg  in the 24 hrs prior. --Current pain regimen:     --Morphine PCA pump: 2mg /hr basal rate + 2mg  IV Q42min PRN;  --We will initiate transition to oral pain regimen: Discontinue as needed opponent of the PCA right now, administer 45 mg of MS Contin, start MSIR 15 mg every 4 hours as needed, keep PCA continuous infusion at 2 mg/h until 1500-hour today to allow for long-acting morphine to achieve adequate plasma concentration, subsequently discontinue PCA completely.  Patient will have additional IV morphine dosing on as-needed basis for pain breaking through the oral regimen.  **Diabetes mellitus: As expected, glucose is higher and now that patient has received glucocorticoids with her chemotherapy. --Continue SSI for now, may have to increase to higher grade scale  **Malnutrition, combined calorie/protein, severe: Severe decrease in oral intake due to peritoneal carcinomatosis and partial bowel obstruction with pain exacerbated by food intake.  Patient has been seen by dietitian.  Phosphorus level has stabilized, albumin is slightly improved compared to the prior days. --Continue advancing  nutrition as possible/tolerable.  **Electrolytes/Fluids:  Hyponatremia: Complex hyponatremia due to intravascular volume depletion, pain, nausea, metastatic disease in the liver.  Na 130 today, likely due to forced diuresis over the past several days.  Hypokalemia: Potassium has dropped likely due to increased urine output and insufficient oral intake.  Replaced with IV KCl/KPhos, resolved without recurrence. --Discontinue furosemide -Recheck sodium level tomorrow.  If still falling, will obtain urine and plasma osmolality and urine sodium to assess for SIADH   **PPx:  --Continue pantoprazole for stress ulcer prophylaxis --Holding enoxaparin ppx for now due to need for repeat paracentesis, using mechanical DVT ppx  **Code Status:  --Full code: Previously, patient was DNR/DNI, but we had additional conversation yesterday and patient insists on revival to be attempted in case of cardiac or respiratory arrest.  Have explained that her likelihood of successful revival is negligible, likely less than 1% based on her extensive malignancy.  Nevertheless, she does not want to commit to DNR/DNI at this point in time.  **Disposition:  --Due to significant improvement of the pain levels, we are transitioning patient to oral pain medications.  Pain control is adequate over the next 24 hours, will consider discharge patient home on Sunday with follow-up in medical oncology/gynecological oncology clinic later next week     LOS: 15 days   Ardath Sax, MD   03/01/2017, 10:48 AM

## 2017-03-02 ENCOUNTER — Inpatient Hospital Stay (HOSPITAL_COMMUNITY): Payer: 59

## 2017-03-02 DIAGNOSIS — C762 Malignant neoplasm of abdomen: Secondary | ICD-10-CM

## 2017-03-02 DIAGNOSIS — A419 Sepsis, unspecified organism: Secondary | ICD-10-CM

## 2017-03-02 DIAGNOSIS — R410 Disorientation, unspecified: Secondary | ICD-10-CM

## 2017-03-02 DIAGNOSIS — E871 Hypo-osmolality and hyponatremia: Secondary | ICD-10-CM

## 2017-03-02 DIAGNOSIS — R140 Abdominal distension (gaseous): Secondary | ICD-10-CM

## 2017-03-02 LAB — COMPREHENSIVE METABOLIC PANEL
ALK PHOS: 90 U/L (ref 38–126)
ALT: 40 U/L (ref 14–54)
ANION GAP: 12 (ref 5–15)
AST: 33 U/L (ref 15–41)
Albumin: 2.5 g/dL — ABNORMAL LOW (ref 3.5–5.0)
BILIRUBIN TOTAL: 0.8 mg/dL (ref 0.3–1.2)
BUN: 19 mg/dL (ref 6–20)
CALCIUM: 8.1 mg/dL — AB (ref 8.9–10.3)
CO2: 35 mmol/L — AB (ref 22–32)
Chloride: 80 mmol/L — ABNORMAL LOW (ref 101–111)
Creatinine, Ser: 0.72 mg/dL (ref 0.44–1.00)
GFR calc non Af Amer: >60 mL/min (ref >60)
Glucose, Bld: 184 mg/dL — ABNORMAL HIGH (ref 65–99)
Potassium: 4.1 mmol/L (ref 3.5–5.1)
SODIUM: 127 mmol/L — AB (ref 135–145)
Total Protein: 6 g/dL — ABNORMAL LOW (ref 6.5–8.1)

## 2017-03-02 LAB — GLUCOSE, CAPILLARY
GLUCOSE-CAPILLARY: 155 mg/dL — AB (ref 65–99)
GLUCOSE-CAPILLARY: 191 mg/dL — AB (ref 65–99)
GLUCOSE-CAPILLARY: 54 mg/dL — AB (ref 65–99)
GLUCOSE-CAPILLARY: 50 mg/dL — AB (ref 65–99)
GLUCOSE-CAPILLARY: 82 mg/dL (ref 65–99)
Glucose-Capillary: 54 mg/dL — ABNORMAL LOW (ref 65–99)
Glucose-Capillary: 69 mg/dL (ref 65–99)

## 2017-03-02 LAB — C DIFFICILE QUICK SCREEN W PCR REFLEX
C DIFFICILE (CDIFF) INTERP: NOT DETECTED
C Diff antigen: NEGATIVE
C Diff toxin: NEGATIVE

## 2017-03-02 LAB — PHOSPHORUS: Phosphorus: 3.7 mg/dL (ref 2.5–4.6)

## 2017-03-02 LAB — ALBUMIN: Albumin: 2.2 g/dL — ABNORMAL LOW (ref 3.5–5.0)

## 2017-03-02 LAB — MAGNESIUM: Magnesium: 1.7 mg/dL (ref 1.7–2.4)

## 2017-03-02 LAB — URIC ACID: Uric Acid, Serum: 3.6 mg/dL (ref 2.3–6.6)

## 2017-03-02 LAB — POTASSIUM: POTASSIUM: 3.7 mmol/L (ref 3.5–5.1)

## 2017-03-02 LAB — MRSA PCR SCREENING: MRSA BY PCR: NEGATIVE

## 2017-03-02 LAB — SODIUM: Sodium: 128 mmol/L — ABNORMAL LOW (ref 135–145)

## 2017-03-02 MED ORDER — PANTOPRAZOLE SODIUM 40 MG IV SOLR
40.0000 mg | Freq: Every day | INTRAVENOUS | Status: DC
  Administered 2017-03-02 – 2017-03-10 (×9): 40 mg via INTRAVENOUS
  Filled 2017-03-02 (×9): qty 40

## 2017-03-02 MED ORDER — DEXTROSE 50 % IV SOLN
INTRAVENOUS | Status: AC
  Administered 2017-03-02: 50 mL via INTRAVENOUS
  Filled 2017-03-02: qty 50

## 2017-03-02 MED ORDER — DEXTROSE 50 % IV SOLN
50.0000 mL | Freq: Once | INTRAVENOUS | Status: AC
  Administered 2017-03-02: 50 mL via INTRAVENOUS

## 2017-03-02 MED ORDER — PIPERACILLIN-TAZOBACTAM 3.375 G IVPB
3.3750 g | Freq: Three times a day (TID) | INTRAVENOUS | Status: DC
  Administered 2017-03-02 – 2017-03-05 (×9): 3.375 g via INTRAVENOUS
  Filled 2017-03-02 (×10): qty 50

## 2017-03-02 MED ORDER — IOPAMIDOL (ISOVUE-300) INJECTION 61%
INTRAVENOUS | Status: AC
  Administered 2017-03-02: 100 mL
  Filled 2017-03-02: qty 100

## 2017-03-02 MED ORDER — VANCOMYCIN HCL IN DEXTROSE 1-5 GM/200ML-% IV SOLN
1000.0000 mg | INTRAVENOUS | Status: DC
  Administered 2017-03-02 – 2017-03-03 (×2): 1000 mg via INTRAVENOUS
  Filled 2017-03-02 (×2): qty 200

## 2017-03-02 MED ORDER — SODIUM CHLORIDE 0.9 % IV SOLN
2.0000 mg/h | INTRAVENOUS | Status: DC
  Administered 2017-03-02: 2 mg/h via INTRAVENOUS
  Filled 2017-03-02: qty 10

## 2017-03-02 MED ORDER — VANCOMYCIN HCL 1000 MG IV SOLR
1000.0000 mg | INTRAVENOUS | Status: DC
  Filled 2017-03-02: qty 1000

## 2017-03-02 MED ORDER — MORPHINE SULFATE (PF) 4 MG/ML IV SOLN
4.0000 mg | INTRAVENOUS | Status: DC | PRN
  Administered 2017-03-02 (×2): 4 mg via INTRAVENOUS
  Administered 2017-03-03: 1 mg via INTRAVENOUS
  Filled 2017-03-02 (×3): qty 1

## 2017-03-02 NOTE — Progress Notes (Signed)
Rapid Response Event Note    Overview:  Called to bedside to evaluate pt d/t agitation      Initial Focused Assessment: Pt sitting up on edge of bed seemly SOB and very agitated, with family at the bedside.  Pt family translating "pt stomach hurts"   Interventions: VS (hr 107) ST, sats 100% on NRB, BP 118/66(75) pt not following commands, and agitated.  Pt RN at bedside and MD arrived at bedside to evaluate pt as well.    Plan of Care (if not transferred):  Pt VSS and MD doesn't want to transfer pt at this time, has put in orders for further evaluation.  Event Summary:   at      at          Dyann Ruddle

## 2017-03-02 NOTE — Progress Notes (Signed)
Patient throughout the night has been experiencing significant discomfort and distress. Patient's daughter reports that her abdomen is causing her pain.  Daughter would like another paracentesis.  Patient also c/o hands and feet being cold. Patient's vitals wnl and pulses/ neuro response unchanged.  Patient does not want hands/feet to be covered despite encouragement.  Patient has been given q1h IV pain medication prn (morphine 2mg ) w/ minimal effectiveness.  MD was notified and pain medication dose was increased (morphine 4mg  q1h prn).  Patient was also given a breathing treatment to help with her shortness of breath.  Will continue to monitor patient overnight. Roderick Pee

## 2017-03-02 NOTE — Progress Notes (Signed)
Patient progressively became more confused this morning and continued to report pain 10/10 despite interventions. Patient around 0400 am  became extremely agitated and confused.  The patient's family member reported that she was not making any sense. MD was again notified. Patient's vitals remained stable but her mental condition was rapidly changing. Rapid Response nurse was called. While rapid response was assessing patient, MD arrived.  While assessing patient, we also noticed a clear, hard plastic tube protruding from her urthera.  Abdominal xray was obtained, and a morphine gtt started for discomfort.  Patient as of shift change was stable enough to stay on floor. MD still on floor putting in orders on patient.Roderick Pee

## 2017-03-02 NOTE — Progress Notes (Addendum)
IP PROGRESS NOTE  Subjective:  Patient suffered significant deterioration of her overall status overnight with delirium, agitation, increasing abdominal pain bloating, diarrhea with stool incontinence.  Later in the morning patient has developed fever and tachycardia.  Due to deterioration has been transferred to intensive care unit for increased monitoring.   Objective: Vital signs in last 24 hours: Blood pressure 130/68, pulse 84, temperature 98.4 F (36.9 C), temperature source Oral, resp. rate 14, height 4' 10.5" (1.486 m), weight 111 lb 12.4 oz (50.7 kg), SpO2 100 %.  Intake/Output from previous day: 02/02 0701 - 02/03 0700 In: 120 [P.O.:120] Out: 1100 [Urine:1100]  Physical Exam: Patient is laying in bed in fetal position resisting attempts to reposition her.  Unable to answer questions coherently   HEENT: Anicteric sclera, moist mucous membranes Lungs:  Gasping breaths.  Decreased breath sounds bilaterally with expiratory wheezing and frequent nonproductive cough Cardiac: S1/S2, regular, no murmurs Abdomen: Increased distention with tympanic sounds to percussion. Lymph nodes: No palpable lymphadenopathy in the cervical, supraclavicular, axillary, or inguinal regions Neurologic: Unable to evaluate today Skin: No rash, petechiae, or ecchymosis. Musculoskeletal: Bilateral lower extremity swelling.   Lab Results: Recent Labs    03/01/17 0400 03/02/17 0500  WBC 2.7* 2.8*  HGB 11.2* 11.4*  HCT 35.6* 35.6*  PLT 136* 167    BMET Recent Labs    03/01/17 0400 03/02/17 0500  NA 130* 127*  K 3.9 4.1  CL 83* 80*  CO2 36* 35*  GLUCOSE 194* 184*  BUN 15 19  CREATININE 0.51 0.72  CALCIUM 7.7* 8.1*    Lab Results  Component Value Date   CEA1 0.9 02/19/2017    Studies/Results: Dg Abd 1 View  Result Date: 03/02/2017 CLINICAL DATA:  Abdominal distention. EXAM: ABDOMEN - 1 VIEW COMPARISON:  02/16/2017 FINDINGS: Examination is technically limited due to motion artifact.  There appears to be gaseous distention of both small and large bowel. This is similar to the previous study. This is likely to indicate ileus. Enteritis less likely. IMPRESSION: Limited examination due to motion artifact. There appears to be mild gaseous distention of small and large bowel most consistent with ileus. Enteritis would be a less likely consideration. Electronically Signed   By: Lucienne Capers M.D.   On: 03/02/2017 06:39    Medications: I have reviewed the patient's current medications.  Assessment/Plan: 67 year old female who presented with abdominal pain and partial bowel obstruction with findings of widely metastatic malignancy including peritoneal carcinomatosis, hepatic lesions, and multifocal bilateral pulmonary involvement.  Peritoneal fluid positive for presence of cells consistent with adenocarcinoma.    **Sepsis/Delirium: Exact etiology unclear, suspecting development of infection/sepsis in the setting of impending neutropenia.  Terminal distention raises concern for possible viscus perforation, but in the setting of diarrhea with previous history of broad-spectrum antibiotics, Clostridium difficile or worsening bowel obstruction cannot be excluded at this time.  Previously obtain abdominal x-ray is of poor quality and is not tremendously useful in assessing the state. --We will attempt obtaining CT of the abdomen and pelvis without contrast. --Consult Critical Care/Pulmonology to assume primary role for the duration of ICU stay or consult depending on their preference --Start piperacillin/tazobactam and vancomycin as we are likely dealing with hospital-acquired infection  **Respiratory failure mixed hypoxemic/hypercarbic: Likely due to combination of malignant infiltration of the lungs including lung infiltration by underlying malignancy including lymphangitic spread, pleural effusion secondary to the malignant ascites, and effects of significant levels of opioid medications  delivered for pain control.  Seen and evaluated  by pulmonology who is recommendations and input are greatly appreciated.  Bicarbonate level down to 36 today, likely due to diuresis with furosemide.  Echocardiogram was obtained 02/27/17 demonstrating preserved left ventricular ejection fraction.  Weight is stable over the past several days, I do not think that the fluid retention is the current problem although patient's lower extremity swelling has increased since yesterday. 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  Nevertheless, distribution of the disease as well as elevation of CA 125 and normal CA19-9 with noncontributory immunohistochemical profile favor primary peritoneal or ovarian etiology. Received the initial chemotherapy with carboplatin AUC 5 + paclitaxel 125 mg/m Q21d on 02/21/17.  No evidence of tumor lysis syndrome so far.  Previously noted hyperkalemia might of been attributable to the spontaneous tumor lysis or renal dysfunction due to ascites.  Lack of nausea, improving pain, and appearance of appetite are very encouraging as signs of response to systemic therapy. 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  Will reaccumulate rapidly until the cancer is addressed. Paracentesis 02/21/17, 02/24/17.  Persistent significant pain in the abdomen with increased bowel output.  Increasing pain is more likely to be associated with increased bowel mobility. --We will abstain from additional paracentesis at this point in time unless abdominal distention  increases significantly.  **Cancer-associated pain: Patient seems to be able to stay awake enough at this current medication levels with no uncontrolled pain.  Patient was transitioned to oral pain medications yesterday and received 2 doses of MS Contin with significant increase in pain this morning. --Current pain regimen:     --MS Contin 45 mg Q12hrs    --MSIR 15 mg every 4 hours as needed --Discontinue oral pain medications.  Restart morphine IV 2 mg/hr continues infusion +4 mg hourly as needed  **Diabetes mellitus: As expected, glucose is higher and now that patient has received glucocorticoids with her chemotherapy. --Continue SSI for now, may have to increase to higher grade scale --Possible transition to insulin drip considering septic state  **Malnutrition, combined calorie/protein, severe: Severe decrease in oral intake due to peritoneal carcinomatosis and partial bowel obstruction with pain exacerbated by food intake.  Patient has been seen by dietitian.  Phosphorus level has stabilized, albumin is slightly improved compared to the prior days. --NPO for now due to risk of aspiration  **Electrolytes/Fluids:  Hyponatremia: Complex hyponatremia due to intravascular volume depletion, pain, nausea, metastatic disease in the liver.  Na further down to 127 today, likely attributable to pain and sepsis  Hypokalemia: Potassium has dropped likely due to increased urine output and insufficient oral intake.  Replaced with IV KCl/KPhos, resolved without recurrence. --Holding furosemide & antihypertensives for now -We will continue monitoring sodium level, may consider restarting IV fluid supplementation   **PPx:  --Continue pantoprazole for stress ulcer prophylaxis --Holding enoxaparin ppx for now due to need for repeat paracentesis, using mechanical DVT ppx  **Code Status:  --Full code: Previously, patient was DNR/DNI, but we had additional conversation yesterday and patient insists on  revival to be attempted in case of cardiac or respiratory arrest.  Have explained that her likelihood of successful revival is negligible, likely less than 1% based on her  extensive malignancy.  CODE STATUS reviewed again today outlining the poor prognosis in case of need for resuscitation.  Family continues to maintain for the patient to remain full code.  **Disposition:  --Patient is critically ill with sepsis in the setting of impending neutropenia     LOS: 16 days   Ardath Sax, MD   03/02/2017, 7:25 AM

## 2017-03-02 NOTE — Consult Note (Signed)
Reason for Consult: Vaginal foreign body   Jessica Gregory is an 67 y.o. female.  HPI: Asked to evaluate this patient with a plastic tube protruding from the vaginal introitus.  Unable to obtain a history as the patient is septic/altered mental status.  Past Medical History:  Diagnosis Date  . Diabetes ([place])   . Language barrier 11/16/2014   From [place], does not yet speak [place]    Past Surgical History:  Procedure Laterality Date  . IR PARACENTESIS  02/21/2017    History reviewed. No pertinent family history.  Social History:  reports that  has never smoked. she has never used smokeless tobacco. She reports that she does not drink alcohol or use drugs.  Allergies: No Known Allergies  Medications: I have reviewed the patient's current medications.  Results for orders placed or performed during the hospital encounter of 02/06/2017 (from the past 48 hour(s))  Glucose, capillary     Status: Abnormal   Collection Time: 02/28/17  5:01 PM  Result Value Ref Range   Glucose-Capillary 152 (H) 65 - 99 mg/dL   Comment 1 Notify RN    Comment 2 Document in Chart   Glucose, capillary     Status: Abnormal   Collection Time: 02/28/17  9:27 PM  Result Value Ref Range   Glucose-Capillary 238 (H) 65 - 99 mg/dL  Magnesium     Status: None   Collection Time: 03/01/17  4:00 AM  Result Value Ref Range   Magnesium 1.8 1.7 - 2.4 mg/dL    Comment: Performed at [facility], [place] [address] [place], [place] [address]  Phosphorus     Status: None   Collection Time: 03/01/17  4:00 AM  Result Value Ref Range   Phosphorus 2.7 2.5 - 4.6 mg/dL    Comment: Performed at [facility], [place] [address] [place], [place] [address]  Uric acid     Status: None   Collection Time: 03/01/17  4:00 AM  Result Value Ref Range   Uric Acid, Serum 3.1 2.3 - 6.6 mg/dL    Comment: Performed at [facility], [place] [address] [place], [place] [address]  CBC with  Differential     Status: Abnormal   Collection Time: 03/01/17  4:00 AM  Result Value Ref Range   WBC 2.7 (L) 4.0 - 10.5 K/uL   RBC 4.37 3.87 - 5.11 MIL/uL   Hemoglobin 11.2 (L) 12.0 - 15.0 g/dL   HCT 35.6 (L) 36.0 - 46.0 %   MCV 81.5 78.0 - 100.0 fL   MCH 25.6 (L) 26.0 - 34.0 pg   MCHC 31.5 30.0 - 36.0 g/dL   RDW 13.8 11.5 - 15.5 %   Platelets 136 (L) 150 - 400 K/uL   Neutrophils Relative % 65 %   Neutro Abs 1.8 1.7 - 7.7 K/uL   Lymphocytes Relative 19 %   Lymphs Abs 0.5 (L) 0.7 - 4.0 K/uL   Monocytes Relative 16 %   Monocytes Absolute 0.4 0.1 - 1.0 K/uL   Eosinophils Relative 0 %   Eosinophils Absolute 0.0 0.0 - 0.7 K/uL   Basophils Relative 0 %   Basophils Absolute 0.0 0.0 - 0.1 K/uL    Comment: Performed at [facility], [place] [address] [place], [place] [address]  Comprehensive metabolic panel     Status: Abnormal   Collection Time: 03/01/17  4:00 AM  Result Value Ref Range   Sodium 130 (L) 135 - 145 mmol/L  Potassium 3.9 3.5 - 5.1 mmol/L   Chloride 83 (L) 101 - 111 mmol/L   CO2 36 (H) 22 - 32 mmol/L   Glucose, Bld 194 (H) 65 - 99 mg/dL   BUN 15 6 - 20 mg/dL   Creatinine, Ser 0.51 0.44 - 1.00 mg/dL   Calcium 7.7 (L) 8.9 - 10.3 mg/dL   Total Protein 5.5 (L) 6.5 - 8.1 g/dL   Albumin 2.3 (L) 3.5 - 5.0 g/dL   AST 38 15 - 41 U/L   ALT 40 14 - 54 U/L   Alkaline Phosphatase 79 38 - 126 U/L   Total Bilirubin 1.0 0.3 - 1.2 mg/dL   GFR calc non Af Amer >60 >60 mL/min   GFR calc Af Amer >60 >60 mL/min    Comment: (NOTE) The eGFR has been calculated using the CKD EPI equation. This calculation has not been validated in all clinical situations. eGFR's persistently <60 mL/min signify possible Chronic Kidney Disease.    Anion gap 11 5 - 15    Comment: Performed at [facility], [place] [address], [place], [place] [address]  Glucose, capillary     Status: Abnormal   Collection Time: 03/01/17  7:26 AM  Result Value Ref Range    Glucose-Capillary 203 (H) 65 - 99 mg/dL   Comment 1 Notify RN    Comment 2 Document in Chart   Glucose, capillary     Status: Abnormal   Collection Time: 03/01/17 11:33 AM  Result Value Ref Range   Glucose-Capillary 152 (H) 65 - 99 mg/dL   Comment 1 Notify RN    Comment 2 Document in Chart   Glucose, capillary     Status: None   Collection Time: 03/01/17  5:14 PM  Result Value Ref Range   Glucose-Capillary 85 65 - 99 mg/dL   Comment 1 Notify RN    Comment 2 Document in Chart   Glucose, capillary     Status: Abnormal   Collection Time: 03/01/17  9:16 PM  Result Value Ref Range   Glucose-Capillary 135 (H) 65 - 99 mg/dL  Magnesium     Status: None   Collection Time: 03/02/17  5:00 AM  Result Value Ref Range   Magnesium 1.7 1.7 - 2.4 mg/dL    Comment: Performed at [facility], [place] [address], [place], [place] [address]  Phosphorus     Status: None   Collection Time: 03/02/17  5:00 AM  Result Value Ref Range   Phosphorus 3.7 2.5 - 4.6 mg/dL    Comment: Performed at [facility], [place] [address], [place], [place] [address]  Uric acid     Status: None   Collection Time: 03/02/17  5:00 AM  Result Value Ref Range   Uric Acid, Serum 3.6 2.3 - 6.6 mg/dL    Comment: Performed at [facility], [place] [address] [place],  [address]  CBC with Differential     Status: Abnormal   Collection Time: 03/02/17  5:00 AM  Result Value Ref Range   WBC 2.8 (L) 4.0 - 10.5 K/uL   RBC 4.39 3.87 - 5.11 MIL/uL   Hemoglobin 11.4 (L) 12.0 - 15.0 g/dL   HCT 35.6 (L) 36.0 - 46.0 %   MCV 81.1 78.0 - 100.0 fL   MCH 26.0 26.0 - 34.0 pg   MCHC 32.0 30.0 - 36.0 g/dL   RDW 13.7 11.5 - 15.5 %   Platelets 167 150 - 400 K/uL  Neutrophils Relative % 52 %   Lymphocytes Relative 20 %   Monocytes Relative 28 %   Eosinophils Relative 0 %   Basophils Relative 0 %   Neutro Abs 1.4 (L) 1.7 - 7.7 K/uL   Lymphs Abs 0.6 (L) 0.7 - 4.0 K/uL   Monocytes  Absolute 0.8 0.1 - 1.0 K/uL   Eosinophils Absolute 0.0 0.0 - 0.7 K/uL   Basophils Absolute 0.0 0.0 - 0.1 K/uL    Comment: Performed at [facility], [place] [address] [place], [place] [address]  Comprehensive metabolic panel     Status: Abnormal   Collection Time: 03/02/17  5:00 AM  Result Value Ref Range   Sodium 127 (L) 135 - 145 mmol/L   Potassium 4.1 3.5 - 5.1 mmol/L   Chloride 80 (L) 101 - 111 mmol/L   CO2 35 (H) 22 - 32 mmol/L   Glucose, Bld 184 (H) 65 - 99 mg/dL   BUN 19 6 - 20 mg/dL   Creatinine, Ser 0.72 0.44 - 1.00 mg/dL   Calcium 8.1 (L) 8.9 - 10.3 mg/dL   Total Protein 6.0 (L) 6.5 - 8.1 g/dL   Albumin 2.5 (L) 3.5 - 5.0 g/dL   AST 33 15 - 41 U/L   ALT 40 14 - 54 U/L   Alkaline Phosphatase 90 38 - 126 U/L   Total Bilirubin 0.8 0.3 - 1.2 mg/dL   GFR calc non Af Amer >60 >60 mL/min   GFR calc Af Amer >60 >60 mL/min    Comment: (NOTE) The eGFR has been calculated using the CKD EPI equation. This calculation has not been validated in all clinical situations. eGFR's persistently <60 mL/min signify possible Chronic Kidney Disease.    Anion gap 12 5 - 15    Comment: Performed at [facility], [place] [address] [place], [place] [address]  C difficile quick scan w PCR reflex     Status: None   Collection Time: 03/02/17  6:29 AM  Result Value Ref Range   C Diff antigen NEGATIVE NEGATIVE   C Diff toxin NEGATIVE NEGATIVE   C Diff interpretation No C. difficile detected.     Comment: Performed at [facility], [place] [address] [place], [place] [address]  Glucose, capillary     Status: Abnormal   Collection Time: 03/02/17  7:54 AM  Result Value Ref Range   Glucose-Capillary 155 (H) 65 - 99 mg/dL  Culture, blood (routine x 2)     Status: None (Preliminary result)   Collection Time: 03/02/17  8:00 AM  Result Value Ref Range   Specimen Description      BLOOD PIC Performed at [facility] [address], [place], [place] [address]    Special Requests      Blood Culture adequate volume BOTTLES DRAWN AEROBIC AND ANAEROBIC   Culture PENDING    Report Status PENDING   Culture, blood (routine x 2)     Status: None (Preliminary result)   Collection Time: 03/02/17  8:00 AM  Result Value Ref Range   Specimen Description      BLOOD RIGHT HAND Performed at [facility] [address], [place], [place] [address]    Special Requests Blood Culture adequate volume IN PEDIATRIC BOTTLE    Culture PENDING    Report Status PENDING   Glucose, capillary     Status: Abnormal   Collection Time: 03/02/17 12:14 PM  Result Value Ref Range  Glucose-Capillary 191 (H) 65 - 99 mg/dL   Comment 1 Notify RN    Comment 2 Document in Chart     Dg Abd 1 View  Result Date: 03/02/2017 CLINICAL DATA:  Abdominal distention. EXAM: ABDOMEN - 1 VIEW COMPARISON:  02/16/2017 FINDINGS: Examination is technically limited due to motion artifact. There appears to be gaseous distention of both small and large bowel. This is similar to the previous study. This is likely to indicate ileus. Enteritis less likely. IMPRESSION: Limited examination due to motion artifact. There appears to be mild gaseous distention of small and large bowel most consistent with ileus. Enteritis would be a less likely consideration. Electronically Signed   By: Lucienne Capers M.D.   On: 03/02/2017 06:39   Dg Chest Port 1 View  Result Date: 03/02/2017 CLINICAL DATA:  Acute respiratory failure. EXAM: PORTABLE CHEST 1 VIEW COMPARISON:  02/26/2017. FINDINGS: Normal sized heart. Left PICC tip in the upper right atrium. Increased diffuse prominence of the pulmonary vasculature and interstitial markings with small bilateral pleural effusions. No acute bony abnormality. IMPRESSION: Mild worsening of changes of congestive heart failure. Electronically Signed   By: Claudie Revering M.D.   On: 03/02/2017 11:18    ROS Blood pressure 107/70, pulse (!)  106, temperature (!) 100.4 F (38 C), temperature source Axillary, resp. rate (!) 24, height 4' 10.5" (1.486 m), weight 111 lb 12.4 oz (50.7 kg), SpO2 100 %. Physical Exam  Genitourinary:     There is a foreign body in the vagina.  Genitourinary Comments: Rectal exam--the foreign body did not to appear to penetrate into the rectum    Assessment/Plan: Likely a segment of a neglected pessary.  It appears that the device was likely fractured previously.  >consider further evaluation for possible fistula when the patient's clinical picture stabilizes >review the CT abd/pelvis >a formal GYN ONC consult is plannned  Lahoma Crocker 03/02/2017, 12:31 PM

## 2017-03-02 NOTE — Progress Notes (Signed)
Pharmacy Antibiotic Note  Ardel Jagger is a 67 y.o. female admitted on 02/05/2017 with abdominal pain and partial bowel obstruction with findings of widely metastatic malignancy including peritoneal carcinomatosis, hepatic lesions, and multifocal bilateral pulmonary involvement. Peritoneal fluid positive for presence of cells consistent with adenocarcinoma.   Pharmacy has been consulted for vancomycin and zosyn dosing for febrile neutropenia.  Today, 03/02/2017  Rapid response event noted Tmax 100.4 WBC down 2.8, ANC 1.4 SCr up 0.72, CrCl 50 ml/min Recultured  Plan: Zosyn 3.375gm IV q8h (4hr extended infusions) Vancomycin 1g IV q36h (estimated AUC 470) Check vancomycin peak and trough at steady state to calculate AUC (goal 400-500) Follow up renal function & cultures  Height: 4' 10.5" (148.6 cm) Weight: 111 lb 12.4 oz (50.7 kg) IBW/kg (Calculated) : 42.05  Temp (24hrs), Avg:98.6 F (37 C), Min:97.6 F (36.4 C), Max:100.4 F (38 C)  Recent Labs  Lab 02/26/17 0506 02/26/17 1145 02/27/17 0745 02/28/17 0500 03/01/17 0400 03/02/17 0500  WBC 11.2*  --  5.6 3.8* 2.7* 2.8*  CREATININE 0.46  --  0.51 0.47 0.51 0.72  LATICACIDVEN  --  1.1  --   --   --   --     Estimated Creatinine Clearance: 50.4 mL/min (by C-G formula based on SCr of 0.72 mg/dL).    No Known Allergies  Antimicrobials this admission:  1/18 Vancomycin >> 1/18, resumed 1/22 >> 1/24 1/17 Zosyn >> 1/18, resumed 1/22 >> 1/24 1/19 Rocephin >> 1/22 1/24 Augmentin >>1/29 2/3 Vancomycin resumed >> 2/3 Zosyn resumed >>  Dose adjustments this admission:  1/23: adj maintenance dose Vanc from 500mg  to 750mg  q24h per current renal fxn/AUC  Microbiology results:  1/17 BCx: NGF  1/18 UCx: NGF 1/17 Influenza panel: neg 1/18 MRSA PCR: neg 1/18 peritoneal washings: NGF 2/3 C.diff: 2/3 BCx: 2/3 MRSA PCR:  Thank you for allowing pharmacy to be a part of this patient's care.  Peggyann Juba, PharmD, BCPS Pager:  [phone] 03/02/2017 8:31 AM

## 2017-03-02 NOTE — Progress Notes (Signed)
Patients vital signs obtained at 0753 and MD notified of such, Rapid response called and patient transferred to ICU.

## 2017-03-02 NOTE — Progress Notes (Signed)
PULMONARY / CRITICAL CARE MEDICINE   Name: Jessica Gregory MRN: [id number] DOB: 21-Jan-1951 PCP Forrest Moron, MD LOS 16 as of 03/02/2017     ADMISSION DATE:  02/25/2017 CONSULTATION DATE:  03/02/2017   REFERRING MD:  Dr Lebron Conners of Oncology  CHIEF COMPLAINT:  Acute resp failure with hypoxemia  HISTORY OF PRESENT ILLNESS:     67 year old [place] female with previous history of type 2 diabetes, hyperlipidemia.  Apparently she has been having some abdominal discomfort for a few months.  She had a CT scan of the abdomen at an urgent care setting which showed ascites with widespread metastatic disease.  She was admitted with dyspnea, fever and some nonproductive cough.  This was associated with poor appetite.  At admission February 14, 2017 she had a CTa chest that ruled out pulmonary embolism but showed diffuse widespread nodular metastatic disease.  Consult has been called because of hypoxemia and patient requiring oxygen.     The main issue was abdominal pain for which she gets pain for which she gets paracentesis and opioids. PCCM eval and signed off 1/17 as pt /fm desired NCB status and focus was directed toward pain management, not escalation of care   03/02/17 early am on floor pt became agitated, delirious per notes c/o severe abd pain and transferred back to ICU and CCM serviced asked to see again at Dr Clydene Laming request  - noted to have borderline neutropenia and already covered for neutropenic sepsis by Onc early am 2/3 (see micro data below)       Subjective Just stopped MS drip, looks more comfortable on NRM  bp soft      VITAL SIGNS: BP 107/70 (BP Location: Right Arm)   Pulse (!) 106   Temp (!) 100.4 F (38 C) (Axillary)   Resp (!) 24   Ht 4' 10.5" (1.486 m)   Wt 111 lb 12.4 oz (50.7 kg)   SpO2 100%   BMI 22.96 kg/m    INTAKE / OUTPUT:   Intake/Output Summary (Last 24 hours) at 03/02/2017 0955 Last data filed at 03/02/2017 0047 Gross per 24 hour  Intake -   Output 1100 ml  Net -1100 ml     EXAM   Elderly asian female looks comfortable at present (MS drip just turned off  HOB 45 degrees  No jvd Oropharynx clear Neck supple Lungs with a few scattered exp > insp rhonchi bilaterally RRR no s3 or or sign murmur Abd tense and not palpated aggressively as pt not in pain but was 10/10 earlier today  Extr warm with no edema or clubbing noted     I personally reviewed images and agree with radiology impression as follows:  pCXR:   03/02/16 Small lung volumes/ air in gut/ no acute infilrates/ c/w met dz   LABS  PULMONARY Recent Labs  Lab 02/26/17 0752  PHART 7.422  PCO2ART 65.0*  PO2ART 73.3*  HCO3 41.6*  O2SAT 94.5    CBC Recent Labs  Lab 02/28/17 0500 03/01/17 0400 03/02/17 0500  HGB 11.2* 11.2* 11.4*  HCT 36.1 35.6* 35.6*  WBC 3.8* 2.7* 2.8*  PLT 143* 136* 167    COAGULATION No results for input(s): INR in the last 168 hours.  CARDIAC  No results for input(s): TROPONINI in the last 168 hours. No results for input(s): PROBNP in the last 168 hours.   CHEMISTRY Recent Labs  Lab 02/26/17 0506 02/27/17 0745 02/28/17 0500 03/01/17 0400 03/02/17 0500  NA 136 136 134* 130* 127*  K 3.8 3.8 3.7 3.9 4.1  CL 86* 87* 85* 83* 80*  CO2 40* 37* 41* 36* 35*  GLUCOSE 174* 229* 189* 194* 184*  BUN _0 CREATININE 0.46 0.51 0.47 0.51 0.72  CALCIUM 8.1* 8.0* 7.7* 7.7* 8.1*  MG 1.8 2.0 1.8 1.8 1.7  PHOS 2.4* 2.1* 2.2* 2.7 3.7   Estimated Creatinine Clearance: 50.4 mL/min (by C-G formula based on SCr of 0.72 mg/dL).   LIVER Recent Labs  Lab 02/26/17 0506 02/27/17 0745 02/28/17 0500 03/01/17 0400 03/02/17 0500  AST 35 30 35 38 33  ALT 32 29 34 40 40  ALKPHOS 59 60 62 79 90  BILITOT 0.7 0.6 0.6 1.0 0.8  PROT 5.3* 5.2* 5.3* 5.5* 6.0*  ALBUMIN 2.5* 2.4* 2.4* 2.3* 2.5*     INFECTIOUS Recent Labs  Lab 02/26/17 1145  LATICACIDVEN 1.1     ENDOCRINE CBG (last 3)  Recent Labs    03/01/17 1714  03/01/17 2116 03/02/17 0754  GLUCAP 85 135* 155*      IMAGING x48h  - image(s) personally visualized  -   highlighted in bold Dg Abd 1 View  Result Date: 03/02/2017 CLINICAL DATA:  Abdominal distention. EXAM: ABDOMEN - 1 VIEW COMPARISON:  02/16/2017 FINDINGS: Examination is technically limited due to motion artifact. There appears to be gaseous distention of both small and large bowel. This is similar to the previous study. This is likely to indicate ileus. Enteritis less likely. IMPRESSION: Limited examination due to motion artifact. There appears to be mild gaseous distention of small and large bowel most consistent with ileus. Enteritis would be a less likely consideration. Electronically Signed   By: Lucienne Capers M.D.   On: 03/02/2017 06:39   ASSESSMENT and PLAN     1) Acute encephalopathy secondary to ? Sepsis / ? 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 Critical Care Time devoted to patient care services described in this note is 45 minutes.   Clearly the high likelihood of  prolonging suffering from pulmonary interventions vastly outweighs any reasonable chance of benefit from offering anything else because medical science has done all it can here to restore health.  Therefore  I don't have any additional recs  except to consider hospice sooner rather than later - paradoxically many patients with respiratory diseases live longer and better once a palliative approach is used in this setting.     Christinia Gully, MD Pulmonary and [place] [phone] After 5:30 PM or weekends, use [facility] [phone]

## 2017-03-03 DIAGNOSIS — R509 Fever, unspecified: Secondary | ICD-10-CM

## 2017-03-03 DIAGNOSIS — K5909 Other constipation: Secondary | ICD-10-CM

## 2017-03-03 DIAGNOSIS — K5900 Constipation, unspecified: Secondary | ICD-10-CM

## 2017-03-03 LAB — CBC WITH DIFFERENTIAL/PLATELET
BASOS ABS: 0 10*3/uL (ref 0.0–0.1)
BASOS PCT: 0 %
Basophils Absolute: 0 10*3/uL (ref 0.0–0.1)
Basophils Relative: 0 %
EOS ABS: 0.1 10*3/uL (ref 0.0–0.7)
EOS PCT: 0 %
Eosinophils Absolute: 0 10*3/uL (ref 0.0–0.7)
Eosinophils Relative: 2 %
HCT: 35.6 % — ABNORMAL LOW (ref 36.0–46.0)
HCT: 34 % — ABNORMAL LOW (ref 36.0–46.0)
HEMOGLOBIN: 11.4 g/dL — AB (ref 12.0–15.0)
HEMOGLOBIN: 10.8 g/dL — AB (ref 12.0–15.0)
LYMPHS ABS: 1.2 10*3/uL (ref 0.7–4.0)
LYMPHS PCT: 18 %
Lymphocytes Relative: 20 %
Lymphs Abs: 0.6 10*3/uL — ABNORMAL LOW (ref 0.7–4.0)
MCH: 26 pg (ref 26.0–34.0)
MCH: 25.5 pg — ABNORMAL LOW (ref 26.0–34.0)
MCHC: 32 g/dL (ref 30.0–36.0)
MCHC: 31.8 g/dL (ref 30.0–36.0)
MCV: 81.1 fL (ref 78.0–100.0)
MCV: 80.4 fL (ref 78.0–100.0)
Monocytes Absolute: 0.8 10*3/uL (ref 0.1–1.0)
Monocytes Absolute: 1.4 10*3/uL — ABNORMAL HIGH (ref 0.1–1.0)
Monocytes Relative: 28 %
Monocytes Relative: 20 %
NEUTROS ABS: 1.4 10*3/uL — AB (ref 1.7–7.7)
NEUTROS PCT: 52 %
Neutro Abs: 4.2 10*3/uL (ref 1.7–7.7)
Neutrophils Relative %: 60 %
PLATELETS: 159 10*3/uL (ref 150–400)
Platelets: 167 10*3/uL (ref 150–400)
RBC: 4.39 MIL/uL (ref 3.87–5.11)
RBC: 4.23 MIL/uL (ref 3.87–5.11)
RDW: 13.7 % (ref 11.5–15.5)
RDW: 13.9 % (ref 11.5–15.5)
WBC: 2.8 10*3/uL — ABNORMAL LOW (ref 4.0–10.5)
WBC: 6.9 10*3/uL (ref 4.0–10.5)

## 2017-03-03 LAB — COMPREHENSIVE METABOLIC PANEL
ALBUMIN: 2.1 g/dL — AB (ref 3.5–5.0)
ALK PHOS: 89 U/L (ref 38–126)
ALT: 33 U/L (ref 14–54)
ANION GAP: 11 (ref 5–15)
AST: 28 U/L (ref 15–41)
BUN: 19 mg/dL (ref 6–20)
CALCIUM: 7.8 mg/dL — AB (ref 8.9–10.3)
CHLORIDE: 84 mmol/L — AB (ref 101–111)
CO2: 36 mmol/L — AB (ref 22–32)
Creatinine, Ser: 0.66 mg/dL (ref 0.44–1.00)
GFR calc non Af Amer: >60 mL/min (ref >60)
GLUCOSE: 91 mg/dL (ref 65–99)
POTASSIUM: 3.4 mmol/L — AB (ref 3.5–5.1)
SODIUM: 131 mmol/L — AB (ref 135–145)
Total Bilirubin: 0.6 mg/dL (ref 0.3–1.2)
Total Protein: 5.6 g/dL — ABNORMAL LOW (ref 6.5–8.1)

## 2017-03-03 LAB — MAGNESIUM: Magnesium: 1.9 mg/dL (ref 1.7–2.4)

## 2017-03-03 LAB — GLUCOSE, CAPILLARY
GLUCOSE-CAPILLARY: 84 mg/dL (ref 65–99)
GLUCOSE-CAPILLARY: 89 mg/dL (ref 65–99)
GLUCOSE-CAPILLARY: 82 mg/dL (ref 65–99)
GLUCOSE-CAPILLARY: 103 mg/dL — AB (ref 65–99)

## 2017-03-03 LAB — URIC ACID: Uric Acid, Serum: 2.9 mg/dL (ref 2.3–6.6)

## 2017-03-03 LAB — PHOSPHORUS: PHOSPHORUS: 2.6 mg/dL (ref 2.5–4.6)

## 2017-03-03 MED ORDER — CHLORHEXIDINE GLUCONATE CLOTH 2 % EX PADS
6.0000 | MEDICATED_PAD | Freq: Every day | CUTANEOUS | Status: DC
  Administered 2017-03-03 – 2017-03-17 (×8): 6 via TOPICAL

## 2017-03-03 MED ORDER — ADULT MULTIVITAMIN LIQUID CH
15.0000 mL | Freq: Every day | ORAL | Status: DC
  Administered 2017-03-03 – 2017-03-13 (×11): 15 mL via ORAL
  Filled 2017-03-03 (×13): qty 15

## 2017-03-03 MED ORDER — ADULT MULTIVITAMIN W/MINERALS CH: 1.0000 | ORAL_TABLET | Freq: Every day | ORAL | Status: DC

## 2017-03-03 MED ORDER — SENNOSIDES-DOCUSATE SODIUM 8.6-50 MG PO TABS
1.0000 | ORAL_TABLET | Freq: Two times a day (BID) | ORAL | Status: DC
  Administered 2017-03-05 – 2017-03-16 (×17): 1 via ORAL
  Filled 2017-03-03 (×22): qty 1

## 2017-03-03 MED ORDER — BOOST / RESOURCE BREEZE PO LIQD CUSTOM
1.0000 | Freq: Three times a day (TID) | ORAL | Status: DC
  Administered 2017-03-03 – 2017-03-05 (×2): 1 via ORAL

## 2017-03-03 MED ORDER — UNJURY CHICKEN SOUP POWDER
8.0000 [oz_av] | Freq: Two times a day (BID) | ORAL | Status: DC
  Administered 2017-03-03 – 2017-03-16 (×10): 8 [oz_av] via ORAL
  Filled 2017-03-03 (×31): qty 27

## 2017-03-03 MED ORDER — POTASSIUM CHLORIDE 10 MEQ/50ML IV SOLN
10.0000 meq | INTRAVENOUS | Status: AC
  Administered 2017-03-03 (×4): 10 meq via INTRAVENOUS
  Filled 2017-03-03 (×4): qty 50

## 2017-03-03 MED ORDER — ENSURE ENLIVE PO LIQD: 237.0000 mL | Freq: Two times a day (BID) | ORAL | Status: DC

## 2017-03-03 MED ORDER — MORPHINE SULFATE (PF) 4 MG/ML IV SOLN
4.0000 mg | INTRAVENOUS | Status: DC | PRN
  Administered 2017-03-03: 4 mg via INTRAVENOUS
  Administered 2017-03-03 (×3): 2 mg via INTRAVENOUS
  Administered 2017-03-03: 1 mg via INTRAVENOUS
  Administered 2017-03-04 – 2017-03-05 (×3): 4 mg via INTRAVENOUS
  Filled 2017-03-03 (×9): qty 1

## 2017-03-03 MED ORDER — SIMETHICONE 40 MG/0.6ML PO SUSP
40.0000 mg | Freq: Four times a day (QID) | ORAL | Status: DC
  Administered 2017-03-03 – 2017-03-17 (×52): 40 mg via ORAL
  Filled 2017-03-03 (×61): qty 0.6

## 2017-03-03 MED ORDER — FUROSEMIDE 20 MG PO TABS
20.0000 mg | ORAL_TABLET | Freq: Every day | ORAL | Status: DC
  Administered 2017-03-03 – 2017-03-06 (×4): 20 mg via ORAL
  Filled 2017-03-03 (×4): qty 1

## 2017-03-03 MED ORDER — PSYLLIUM 95 % PO PACK
1.0000 | PACK | Freq: Every day | ORAL | Status: DC
  Administered 2017-03-04 – 2017-03-07 (×4): 1 via ORAL
  Filled 2017-03-03 (×11): qty 1

## 2017-03-03 MED ORDER — POLYETHYLENE GLYCOL 3350 17 G PO PACK: 17.0000 g | PACK | Freq: Every day | ORAL | Status: DC | PRN

## 2017-03-03 MED ORDER — ENOXAPARIN SODIUM 40 MG/0.4ML ~~LOC~~ SOLN
40.0000 mg | SUBCUTANEOUS | Status: DC
  Administered 2017-03-03 – 2017-03-17 (×11): 40 mg via SUBCUTANEOUS
  Filled 2017-03-03 (×13): qty 0.4

## 2017-03-03 NOTE — Progress Notes (Signed)
Date: March 03, 2017 Velva Harman, BSN, [place], [place] [phone] Chart and notes review for patient progress and needs./ goals of care meeting pending. Ascites increasing/ Will follow for case management and discharge needs. No cm or discharge needs present at time of this review. Next review date: [id number]

## 2017-03-03 NOTE — Consult Note (Signed)
03/03/2017 9:30 AM   Jessica Gregory [birth year] [id number]  Referring provider: Dr. Jerilynn Mages. [place]  CC: Urethral foreign body  HPI: The patient is a 67 year old female presented to the hospital with sepsis.  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  Urology was consulted for potential foreign body seen on CT scan within her urethra.  She currently is in severe abdominal pain from her metastatic disease.  In terms of voiding, she does note that she has a good stream and feels that she empties her bladder.  She has no voiding complaints at this time nor does she have any difficulty with urination.  She did on this hospitalization have a pessary removed for prolapse that was apparently placed in 1993.  There is concerned that he still has residual issues with her urethra based on her CT scan.   PMH: Past Medical History:  Diagnosis Date  . Diabetes ([place])   . Language barrier 11/16/2014   From [place], does not yet speak English    Surgical History: Past Surgical History:  Procedure Laterality Date  . IR PARACENTESIS  02/21/2017   Allergies: No Known Allergies  Family History: History reviewed. No pertinent family history.  Social History:  reports that  has never smoked. she has never used smokeless tobacco. She reports that she does not drink alcohol or use drugs.  ROS: 12 point ROS negative except for above  Physical Exam: BP 119/70   Pulse (!) 108   Temp (!) 96.8 F (36 C) (Axillary)   Resp 18   Ht 4' 10.5" (1.486 m)   Wt 119 lb 7.8 oz (54.2 kg)   SpO2 100%   BMI 24.55 kg/m   Constitutional:  Alert and oriented, No acute distress. HEENT: [place] AT, moist mucus membranes.  Trachea midline, no masses. Cardiovascular: No clubbing, cyanosis, or edema. Respiratory: Normal respiratory effort, no increased work of breathing. GI: Abdomen is soft, tender to palpation diffusively GU: No CVA tenderness.  Skin: No rashes, bruises  or suspicious lesions. Lymph: No cervical or inguinal adenopathy. Neurologic: Grossly intact, no focal deficits, moving all 4 extremities. Psychiatric: Normal mood and affect.  Laboratory Data: Lab Results  Component Value Date   WBC 6.9 03/03/2017   HGB 10.8 (L) 03/03/2017   HCT 34.0 (L) 03/03/2017   MCV 80.4 03/03/2017   PLT 159 03/03/2017    Lab Results  Component Value Date   CREATININE 0.66 03/03/2017    No results found for: PSA  No results found for: TESTOSTERONE  Lab Results  Component Value Date   HGBA1C 9.4 02/14/2017    Urinalysis    Component Value Date/Time   COLORURINE YELLOW 02/27/2017 1848   APPEARANCEUR CLEAR 02/24/2017 1848   LABSPEC >1.046 (H) 02/06/2017 1848   PHURINE 6.0 02/20/2017 1848   GLUCOSEU NEGATIVE 02/11/2017 1848   HGBUR SMALL (A) 02/22/2017 1848   BILIRUBINUR NEGATIVE 02/09/2017 1848   BILIRUBINUR moderate (A) 02/14/2017 1127   KETONESUR 80 (A) 02/06/2017 1848   PROTEINUR NEGATIVE 02/12/2017 1848   UROBILINOGEN 1.0 02/08/2017 1127   NITRITE NEGATIVE 02/09/2017 1848   LEUKOCYTESUR MODERATE (A) 02/06/2017 1848    Pertinent Imaging: CT reviewed as above   Assessment & Plan:    1. Metastatic cancer of unknown origin 2. Possible urethral foreign body The patient has metastatic cancer of unclear origin currently.  She currently is voiding well.  She has no urinary complaints at this time.  Her bladder is empty  on CT scan.  I reviewed the images. I am not convinced that the calcification is even within her urethra.  Since she is voiding well, I do not think she needs any further intervention at this time.  She would benefit from cystoscopy to ensure that her urethra has no foreign bodies as an outpatient.  We can arrange for this outpatient cystoscopy pending her overall status.  If she is made palliative care, there is no further urological intervention needed.  If her and her family decide to choose maximal therapy, I will arrange for  her to have a cystoscopy when she is discharged as an outpatient.   Nickie Retort, MD

## 2017-03-03 NOTE — Progress Notes (Addendum)
PULMONARY / CRITICAL CARE MEDICINE   Name: Jessica Gregory MRN: [id number] DOB: 1950-05-05 PCP Forrest Moron, MD LOS 17 as of 03/03/2017     ADMISSION DATE:  02/15/2017 CONSULTATION DATE:  03/03/2017   REFERRING MD:  Dr Lebron Conners of Oncology  CHIEF COMPLAINT:  Acute resp failure with hypoxemia  HISTORY OF PRESENT ILLNESS:     67 year old [place] female with previous history of type 2 diabetes, hyperlipidemia.  Apparently she has been having some abdominal discomfort for a few months.  She had a CT scan of the abdomen at an urgent care setting which showed ascites with widespread metastatic disease.  She was admitted with dyspnea, fever and some nonproductive cough.  This was associated with poor appetite.  At admission February 14, 2017 she had a CTa chest that ruled out pulmonary embolism but showed diffuse widespread nodular metastatic disease.  Consult has been called because of hypoxemia and patient requiring oxygen.     The main issue was abdominal pain for which she gets pain for which she gets paracentesis and opioids. PCCM eval and signed off 1/17 as pt /fm desired NCB status and focus was directed toward pain management, not escalation of care   03/02/17 early am on floor pt became agitated, delirious per notes c/o severe abd pain and transferred back to ICU and CCM serviced asked to see again at Dr Clydene Laming request  - noted to have borderline neutropenia and already covered for neutropenic sepsis by Onc early am 2/3 (see micro data below)    Subjective Stable overnight. C/O abdominal pain.   VITAL SIGNS: BP 119/70   Pulse (!) 108   Temp (!) 96.8 F (36 C) (Axillary)   Resp 18   Ht 4' 10.5" (1.486 m)   Wt 119 lb 7.8 oz (54.2 kg)   SpO2 100%   BMI 24.55 kg/m    INTAKE / OUTPUT:   Intake/Output Summary (Last 24 hours) at 03/03/2017 0948 Last data filed at 03/02/2017 1800 Gross per 24 hour  Intake 313.7 ml  Output -  Net 313.7 ml     EXAM Gen:      No acute  distress, moderate distress HEENT:  EOMI, sclera anicteric Neck:     No masses; no thyromegaly Lungs:    Clear to auscultation bilaterally; normal respiratory effort CV:         Regular rate and rhythm; no murmurs Abd:      Distended tense abdomen.  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  LABS  PULMONARY Recent Labs  Lab 02/26/17 0752  PHART 7.422  PCO2ART 65.0*  PO2ART 73.3*  HCO3 41.6*  O2SAT 94.5    CBC Recent Labs  Lab 03/01/17 0400 03/02/17 0500 03/03/17 0334  HGB 11.2* 11.4* 10.8*  HCT 35.6* 35.6* 34.0*  WBC 2.7* 2.8* 6.9  PLT 136* 167 159    COAGULATION No results for input(s): INR in the last 168 hours.  CARDIAC  No results for input(s): TROPONINI in the last 168 hours. No results for input(s): PROBNP in the last 168 hours.   CHEMISTRY Recent Labs  Lab 02/27/17 0745 02/28/17 0500 03/01/17 0400 03/02/17 0500 03/02/17 1750 03/03/17 5329  NA 136 134* 130* 127* 128* 131*  K 3.8 3.7 3.9 4.1 3.7 3.4*  CL 87* 85* 83* 80*  --  84*  CO2 37* 41* 36* 35*  --  36*  GLUCOSE 229* 189* 194* 184*  --  91  BUN 15 11 15 19   --  19  CREATININE 0.51 0.47 0.51 0.72  --  0.66  CALCIUM 8.0* 7.7* 7.7* 8.1*  --  7.8*  MG 2.0 1.8 1.8 1.7  --  1.9  PHOS 2.1* 2.2* 2.7 3.7  --  2.6   Estimated Creatinine Clearance: 51.9 mL/min (by C-G formula based on SCr of 0.66 mg/dL).   LIVER Recent Labs  Lab 02/27/17 0745 02/28/17 0500 03/01/17 0400 03/02/17 0500 03/02/17 1750 03/03/17 0334  AST 30 35 38 33  --  28  ALT 29 34 40 40  --  33  ALKPHOS 60 62 79 90  --  89  BILITOT 0.6 0.6 1.0 0.8  --  0.6  PROT 5.2* 5.3* 5.5* 6.0*  --  5.6*   ALBUMIN 2.4* 2.4* 2.3* 2.5* 2.2* 2.1*     INFECTIOUS Recent Labs  Lab 02/26/17 1145  LATICACIDVEN 1.1     ENDOCRINE CBG (last 3)  Recent Labs    03/02/17 2039 03/02/17 2327 03/03/17 0722  GLUCAP 82 69 84      IMAGING x48h  - image(s) personally visualized  -   highlighted in bold Dg Abd 1 View  Result Date: 03/02/2017 CLINICAL DATA:  Abdominal distention. EXAM: ABDOMEN - 1 VIEW COMPARISON:  02/16/2017 FINDINGS: Examination is technically limited due to motion artifact. There appears to be gaseous distention of both small and large bowel. This is similar to the previous study. This is likely to indicate ileus. Enteritis less likely. IMPRESSION: Limited examination due to motion artifact. There appears to be mild gaseous distention of small and large bowel most consistent with ileus. Enteritis would be a less likely consideration. Electronically Signed   By: Lucienne Capers M.D.   On: 03/02/2017 06:39   Ct Abdomen Pelvis W Contrast  Result Date: 03/02/2017 CLINICAL DATA:  Foreign body in urethra.  Stage IV cancer. EXAM: CT ABDOMEN AND PELVIS WITH CONTRAST TECHNIQUE: Multidetector CT imaging of the abdomen and pelvis was performed using the standard protocol following bolus administration of intravenous contrast. CONTRAST:  135mL ISOVUE-300 IOPAMIDOL (ISOVUE-300) INJECTION 61% COMPARISON:  02/09/2017. FINDINGS: Lower chest: Innumerable pulmonary nodules/metastases. Mild patchy right lower lobe opacity, possibly atelectasis. Hepatobiliary: Abnormal appearance of the gallbladder fundus (series 2/image 34), suggesting gallbladder adenocarcinoma, with direct mass-like invasion of the adjacent hepatic parenchyma. Combined tumor involving the liver and gallbladder measures approximately 5.1 x 8.7 x 6.4 cm (series 2/image 32; coronal image 32). 2.1 cm hepatic metastasis in segment 8 (series 2/image 24). These findings are mildly progressive from recent CT. Pancreas: Within normal limits.  Spleen: Within normal limits. Adrenals/Urinary Tract: Adrenal glands are within normal limits. 9 mm cyst in the posterior right lower kidney (series 2/image 43). Left kidney is within normal limits. No hydronephrosis. Bladder is within normal limits. Stomach/Bowel: Stomach is within normal limits. Dilated loops of small bowel in the right mid abdomen (series 2/image 70), with decompressed loops of terminal ileum (series 2/image 64), raising the possibility of small bowel obstruction. However, the colon is nondilated and in fact mildly distended and fluid-filled. As such, this overall appearance may reflect adynamic ileus, particularly if this patient is on narcotic pain medication. Vascular/Lymphatic: No evidence of abdominal aortic aneurysm. Atherosclerotic  calcifications of the abdominal aorta and branch vessels. Small upper abdominal lymph nodes. Widespread omental caking (for example, series 2/image 49). Scattered peritoneal implants, including a dominant 4.0 cm implant in the right mid abdomen (series 2/image 39). Additional implants in the left upper abdomen anterior to the spleen. This appearance is grossly unchanged. Reproductive: Uterus is grossly unremarkable. No adnexal masses. Other: Moderate abdominopelvic ascites, partially loculated with mass effect beneath the right mid abdominal wall (series 2/image 45, reflecting malignant ascites. This appearance is grossly unchanged. A punctate radiodensity is present at the urethral meatus (series 2/image 95) and was also present on prior CT. This may be related to the patient's known plastic (non radiopaque) foreign body. However, this is not well visualized on CT. Musculoskeletal: Visualized osseous structures are within normal limits. IMPRESSION: Punctate radiodensity at the urethral meatus, possibly related to the patient's known plastic (non radiopaque) foreign body, not well visualized on CT. 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 Associated hepatic metastasis. Associated widespread peritoneal disease with omental caking and ascites, grossly unchanged. Dilated loops of small bowel in the right mid abdomen, favoring adynamic ileus due to lack of colonic decompression. Widespread pulmonary metastases, incompletely visualized. Electronically Signed   By: Julian Hy M.D.   On: 03/02/2017 14:03   Dg Chest Port 1 View  Result Date: 03/02/2017 CLINICAL DATA:  Acute respiratory failure. EXAM: PORTABLE CHEST 1 VIEW COMPARISON:  02/26/2017. FINDINGS: Normal sized heart. Left PICC tip in the upper right atrium. Increased diffuse prominence of the pulmonary vasculature and interstitial markings with small bilateral pleural effusions. No acute bony abnormality. IMPRESSION: Mild worsening of changes of congestive heart failure. Electronically Signed   By: Claudie Revering M.D.   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 She is doing better today and is due to get transferred out of the ICU  DNR status was reversed over the weekend.  Discussed with family and daughter today.  I recommended that she be made DNR again as it would be impossible to get her off the vent in case of respiratory failure.  We do not have much else to offer. Discussed with Dr. Lebron Conners.  Will get palliative care involved to continue goals of care discussion.  PCCM will be available as needed during this admission.  Marshell Garfinkel MD [facility] Pager [phone] If no answer or after 3pm call: [phone] 03/03/2017, 10:03 AM

## 2017-03-03 NOTE — Progress Notes (Signed)
IP PROGRESS NOTE  Subjective:  Patient's clinical course has improved significantly in the afternoon yesterday with recovery from delirium and significant improvement in the abdominal pain down to level of 2/10.  Patient was found to have a foreign body in her vaginal wall with some injury sustained to the soft tissues of the vagina and labia minor.  That was a plastic C shaped object which appears to be a pessary.  Patient now recalls having a possible uterine prolapse in the 90s receiving the placement of the pessary at that time.  She does not think that she had any follow-up for this since then.  Recently, her daughter was asking the patient to obtain a Pap smear, but patient has refused at that time.  Currently, continues to complain of abdominal distention.  Pain is now rated 10 out of 10.  No active nausea  Objective: Vital signs in last 24 hours: Blood pressure 119/70, pulse (!) 108, temperature 97.8 F (36.6 C), temperature source Oral, resp. rate 18, height 4' 10.5" (1.486 m), weight 119 lb 7.8 oz (54.2 kg), SpO2 100 %.  Intake/Output from previous day: 02/03 0701 - 02/04 0700 In: 313.7 [I.V.:13.7; IV Piggyback:300] Out: -   Physical Exam: Patient is laying in bed in fetal position resisting attempts to reposition her.  Unable to answer questions coherently   HEENT: Anicteric sclera, moist mucous membranes Lungs:  Gasping breaths.  Decreased breath sounds bilaterally with expiratory wheezing and frequent nonproductive cough Cardiac: S1/S2, regular, no murmurs Abdomen: Increased distention with tympanic sounds to percussion. Lymph nodes: No palpable lymphadenopathy in the cervical, supraclavicular, axillary, or inguinal regions Neurologic: Unable to evaluate today Skin: No rash, petechiae, or ecchymosis. Musculoskeletal: Bilateral lower extremity swelling.   Lab Results: Recent Labs    03/02/17 0500 03/03/17 0334  WBC 2.8* 6.9  HGB 11.4* 10.8*  HCT 35.6* 34.0*  PLT 167  159    BMET Recent Labs    03/02/17 0500 03/02/17 1750 03/03/17 0334  NA 127* 128* 131*  K 4.1 3.7 3.4*  CL 80*  --  84*  CO2 35*  --  36*  GLUCOSE 184*  --  91  BUN 19  --  19  CREATININE 0.72  --  0.66  CALCIUM 8.1*  --  7.8*    Lab Results  Component Value Date   CEA1 0.9 02/19/2017    Studies/Results: Dg Abd 1 View  Result Date: 03/02/2017 CLINICAL DATA:  Abdominal distention. EXAM: ABDOMEN - 1 VIEW COMPARISON:  02/16/2017 FINDINGS: Examination is technically limited due to motion artifact. There appears to be gaseous distention of both small and large bowel. This is similar to the previous study. This is likely to indicate ileus. Enteritis less likely. IMPRESSION: Limited examination due to motion artifact. There appears to be mild gaseous distention of small and large bowel most consistent with ileus. Enteritis would be a less likely consideration. Electronically Signed   By: Lucienne Capers M.D.   On: 03/02/2017 06:39   Ct Abdomen Pelvis W Contrast  Result Date: 03/02/2017 CLINICAL DATA:  Foreign body in urethra.  Stage IV cancer. EXAM: CT ABDOMEN AND PELVIS WITH CONTRAST TECHNIQUE: Multidetector CT imaging of the abdomen and pelvis was performed using the standard protocol following bolus administration of intravenous contrast. CONTRAST:  128mL ISOVUE-300 IOPAMIDOL (ISOVUE-300) INJECTION 61% COMPARISON:  01/31/2017. FINDINGS: Lower chest: Innumerable pulmonary nodules/metastases. Mild patchy right lower lobe opacity, possibly atelectasis. Hepatobiliary: Abnormal appearance of the gallbladder fundus (series 2/image 34), suggesting gallbladder adenocarcinoma,  with direct mass-like invasion of the adjacent hepatic parenchyma. Combined tumor involving the liver and gallbladder measures approximately 5.1 x 8.7 x 6.4 cm (series 2/image 32; coronal image 32). 2.1 cm hepatic metastasis in segment 8 (series 2/image 24). These findings are mildly progressive from recent CT. Pancreas:  Within normal limits. Spleen: Within normal limits. Adrenals/Urinary Tract: Adrenal glands are within normal limits. 9 mm cyst in the posterior right lower kidney (series 2/image 43). Left kidney is within normal limits. No hydronephrosis. Bladder is within normal limits. Stomach/Bowel: Stomach is within normal limits. Dilated loops of small bowel in the right mid abdomen (series 2/image 70), with decompressed loops of terminal ileum (series 2/image 64), raising the possibility of small bowel obstruction. However, the colon is nondilated and in fact mildly distended and fluid-filled. As such, this overall appearance may reflect adynamic ileus, particularly if this patient is on narcotic pain medication. Vascular/Lymphatic: No evidence of abdominal aortic aneurysm. Atherosclerotic calcifications of the abdominal aorta and branch vessels. Small upper abdominal lymph nodes. Widespread omental caking (for example, series 2/image 49). Scattered peritoneal implants, including a dominant 4.0 cm implant in the right mid abdomen (series 2/image 39). Additional implants in the left upper abdomen anterior to the spleen. This appearance is grossly unchanged. Reproductive: Uterus is grossly unremarkable. No adnexal masses. Other: Moderate abdominopelvic ascites, partially loculated with mass effect beneath the right mid abdominal wall (series 2/image 45, reflecting malignant ascites. This appearance is grossly unchanged. A punctate radiodensity is present at the urethral meatus (series 2/image 95) and was also present on prior CT. This may be related to the patient's known plastic (non radiopaque) foreign body. However, this is not well visualized on CT. Musculoskeletal: Visualized osseous structures are within normal limits. IMPRESSION: Punctate radiodensity at the urethral meatus, possibly related to the patient's known plastic (non radiopaque) foreign body, not well visualized on CT. 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 Associated hepatic metastasis. Associated widespread peritoneal disease with omental caking and ascites, grossly unchanged. Dilated loops of small bowel in the right mid abdomen, favoring adynamic ileus due to lack of colonic decompression. Widespread pulmonary metastases, incompletely visualized. Electronically Signed   By: Julian Hy M.D.   On: 03/02/2017 14:03   Dg Chest Port 1 View  Result Date: 03/02/2017 CLINICAL DATA:  Acute respiratory failure. EXAM: PORTABLE CHEST 1 VIEW COMPARISON:  02/26/2017. FINDINGS: Normal sized heart. Left PICC tip in the upper right atrium. Increased diffuse prominence of the pulmonary vasculature and interstitial markings with small bilateral pleural effusions. No acute bony abnormality. IMPRESSION: Mild worsening of changes of congestive heart failure. Electronically Signed   By: Claudie Revering M.D.   On: 03/02/2017 11:18    Medications: I have reviewed the patient's current medications.  Assessment/Plan: 67 year old female who presented with abdominal pain and partial bowel obstruction with findings of widely metastatic malignancy including peritoneal carcinomatosis, hepatic lesions, and multifocal bilateral pulmonary involvement.  Peritoneal fluid positive for presence of cells consistent with adenocarcinoma.    **SBO, partial/Ileus due to opioids: Recent CT of the abdomen pelvis demonstrates persistent small bowel distention consistent with at least partial obstruction.  In addition to that,: Appears distended with retention of stool which is likely due to opioid medications. --Unfortunately, cannot use methylnaltrexone due to presence of bowel obstruction. - Will resume gentle laxatives and reduce opioid doses   --Senna/Docusate + Psyllum  --Miralax PRN  **Sepsis/Delirium: Unclear if combination of severe pain, opioid medications, and possible infection  combined to contribute to the presentation  last night.  CT of the abdomen/pelvis demonstrates no evidence of free air in the peritoneum.  Essentially stable appearance of the known malignancy.  A possible foreign body is noted in the urethra.  Differential for that includes a nephrolith versus portion of the pessary --Continue piperacillin/tazobactam and vancomycin due to febrile episode last night and possibility of developing neutropenia in the setting of recent systemic chemotherapy.  If patient is afebrile over 72 hours, will discontinue antibiotics. --Will limit opioid medications to short-acting forms only apparently does not tolerate MS Contin that has been tried several times  **Respiratory failure mixed hypoxemic/hypercarbic: Likely due to combination of malignant infiltration of the lungs including lung infiltration by underlying malignancy including lymphangitic spread, pleural effusion secondary to the malignant ascites, and effects of significant levels of opioid medications delivered for pain control.  Seen and evaluated by pulmonology who is recommendations and input are greatly appreciated.  Bicarbonate level down to 36 today, likely due to diuresis with furosemide.  Echocardiogram was obtained 02/27/17 demonstrating preserved left ventricular ejection fraction.  Weight is stable over the past several days, I do not think that the fluid retention is the current problem although patient's lower extremity swelling has increased since yesterday. 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  Nevertheless, distribution of the disease as well as elevation of CA 125 and normal CA19-9 with noncontributory immunohistochemical profile favor primary peritoneal or ovarian  etiology. Received the initial chemotherapy with carboplatin AUC 5 + paclitaxel 125 mg/m Q21d on 02/21/17.  Repeat CT scan of the abdomen and pelvis demonstrates essentially stable findings.  Radiographic appearance is still very worrisome for possible primary gallbladder carcinoma based on the thickened gallbladder and appearance of direct hepatic invasion.  At this time, patient has received the first dose of systemic chemotherapy and no additional treatment will be administered until 03/14/17.  My hope is that the patient is discharged from hospital by that time to follow-up as outpatient. --Daily labs --Too early to assess treatment response at this time  **Recurrent malignant ascites: Due to underlying malignancy.  Will reaccumulate rapidly until the cancer is addressed. Paracentesis 02/21/17, 02/24/17.  Persistent significant pain in the abdomen with increased bowel output.  Increasing pain is more likely to be associated with increased bowel mobility. --We will abstain from additional paracentesis at this point in time unless abdominal distention increases significantly.  **Cancer-associated pain: Patient seems to be able to stay awake enough at this current medication levels with no uncontrolled pain.  Pain has improved significantly last night after removal of the retained pessary.  This morning, increasing abdominal pain likely due to combination of small bowel obstruction, malignancy, and constipation due to opioids.  Abstain from future use of long-acting opioids due to negative effect on bowel motility and mentation less patient chooses to proceed with comfort-only management style. --Current pain regimen:     --MSIR 4mg  IV Q1hrs PRN Pain =>5 --If adequate pain control is obtained on IV, will consider attempting transition to short-acting pain medication such as hydromorphone. -Agree with palliative care consult to assist me in the symptom management  **Diabetes mellitus: As expected,  glucose is higher and now that patient has received glucocorticoids with her chemotherapy. --Continue SSI for now, may have  to increase to higher grade scale  **Malnutrition, combined calorie/protein, severe: Severe decrease in oral intake due to peritoneal carcinomatosis and partial bowel obstruction with pain exacerbated by food intake.  Patient has been seen by dietitian.  Phosphorus level has stabilized, albumin is slightly improved compared to the prior days. --Resume diet and advance as tolerated  **Electrolytes/Fluids:  Hyponatremia: Complex hyponatremia due to intravascular volume depletion, pain, nausea, metastatic disease in the liver.  Na improving today  Hypokalemia: Potassium has dropped likely due to increased urine output and insufficient oral intake.  Replaced with IV KCl/KPhos, resolved without recurrence. --Resuming furosemide, will administer IV potassium replacement today.   **PPx:  --Continue pantoprazole for stress ulcer prophylaxis  --Start enoxaparin 40 mg subcu daily for DVT prophylaxis  **Code Status:  --Full code: Previously, patient was DNR/DNI, but we had additional conversation yesterday and patient insists on revival to be attempted in case of cardiac or respiratory arrest.  Have explained that her likelihood of successful revival is negligible, likely less than 1% based on her extensive malignancy.  CODE STATUS reviewed again today outlining the poor prognosis in case of need for resuscitation.  Family continues to maintain for the patient to remain full code.  **Disposition:  --Unclear at this time.  Need to establish reliable pain control as well and has improved bowel motility prior to the patient being able to return home.  Patient has a very supportive family who assist her in mobility and self-cares.    LOS: 17 days   Ardath Sax, MD   03/03/2017, 7:11 AM

## 2017-03-03 NOTE — Progress Notes (Addendum)
Nutrition Follow-up  DOCUMENTATION CODES:   Not applicable  INTERVENTION:  - Will order Boost Breeze TID, each supplement provides 250 kcal and 9 grams of protein. - Will order [place]jury Chicken Soup BID, each 8 ounce serving provides 100 kcal and 21 grams of protein. - Will order daily multivitamin with minerals. - Continue to encourage PO intakes.    NUTRITION DIAGNOSIS:   Inadequate oral intake related to poor appetite, early satiety as evidenced by per patient/family report. -ongoing  GOAL:   Patient will meet greater than or equal to 90% of their needs -unmet  MONITOR:   PO intake, Supplement acceptance, Weight trends, Labs, I & O's  ASSESSMENT:   Pt with PMH significant for DM and HLD. Was recently found to have abnormal CT reading concerning for widespread metastatic disease. Admitted for newly diagnosed intra-abdominal and pulmonary metastases.   Significant Events: 1/18- paracentesis drained 2.5 L  1/25- paracentesis drained 1.85 L/ Cycle 1 Taxol/carboplatin 1/28- paracentesis drained 0.92 L   Weight has been fluctuating since admission with current weight being consistent with weight from admission (1/18) until 1/23. Will continue to monitor closely. Estimated nutrition needs updated based on extensive nature of metastatic disease and neutropenia.   She continues with decreased/poor appetite and has been eating 10-50% at meals since previous assessment with most meals being <25% and mainly bites of items. Diet downgraded from Regular to CLD yesterday at 6:10 PM d/t CT showing partial SBO vs ileus.  Reviewed Dr. Matilde Bash note from this AM in detail. Palliative likely to be consulted. Will continue to monitor POC/GOC and associated nutrition-related needs.  Medications reviewed; 20 mg oral Lasix/day, sliding scale Novolog, 10 mEq IV KCl x4 runs today, 1 packet Metamucil/day. Labs reviewed; Na: 131 mmol/L, K: 3.4 mmol/L, Cl: 84 mmol/L, Ca: 7.8 mg/dL.    Diet Order:   Diet clear liquid Room service appropriate? Yes; Fluid consistency: Thin  EDUCATION NEEDS:   Education needs have been addressed  Skin:  Skin Assessment: Reviewed RN Assessment  Last BM:  2-3  Height:   Ht Readings from Last 1 Encounters:  02/23/17 4' 10.5" (1.486 m)    Weight:   Wt Readings from Last 1 Encounters:  03/03/17 119 lb 7.8 oz (54.2 kg)    Ideal Body Weight:  44.3 kg  BMI:  Body mass index is 24.55 kg/m.  Estimated Nutritional Needs:   Kcal:  1790-1950 (33-36 kcal/kg)  Protein:  80-92 grams (1.5-1.7 grams/kg)  Fluid:  >/= 1.8 L/day       Jarome Matin, MS, RD, LDN, [facility] Inpatient Clinical Dietitian Pager # [phone] After hours/weekend pager # [phone]

## 2017-03-04 LAB — CBC WITH DIFFERENTIAL/PLATELET
BASOS ABS: 0 10*3/uL (ref 0.0–0.1)
Basophils Relative: 0 %
Eosinophils Absolute: 0 10*3/uL (ref 0.0–0.7)
Eosinophils Relative: 0 %
HCT: 33 % — ABNORMAL LOW (ref 36.0–46.0)
Hemoglobin: 10.2 g/dL — ABNORMAL LOW (ref 12.0–15.0)
LYMPHS ABS: 1.6 10*3/uL (ref 0.7–4.0)
Lymphocytes Relative: 12 %
MCH: 25.4 pg — ABNORMAL LOW (ref 26.0–34.0)
MCHC: 30.9 g/dL (ref 30.0–36.0)
MCV: 82.1 fL (ref 78.0–100.0)
MONO ABS: 1.5 10*3/uL — AB (ref 0.1–1.0)
Monocytes Relative: 11 %
NEUTROS PCT: 77 %
Neutro Abs: 10.2 10*3/uL — ABNORMAL HIGH (ref 1.7–7.7)
Platelets: 167 10*3/uL (ref 150–400)
RBC: 4.02 MIL/uL (ref 3.87–5.11)
RDW: 14 % (ref 11.5–15.5)
WBC: 13.3 10*3/uL — AB (ref 4.0–10.5)
nRBC: 1 /100 WBC — ABNORMAL HIGH

## 2017-03-04 LAB — COMPREHENSIVE METABOLIC PANEL
ALBUMIN: 1.9 g/dL — AB (ref 3.5–5.0)
ALT: 30 U/L (ref 14–54)
ANION GAP: 14 (ref 5–15)
AST: 26 U/L (ref 15–41)
Alkaline Phosphatase: 88 U/L (ref 38–126)
BILIRUBIN TOTAL: 0.8 mg/dL (ref 0.3–1.2)
BUN: 11 mg/dL (ref 6–20)
CO2: 33 mmol/L — AB (ref 22–32)
Calcium: 7.5 mg/dL — ABNORMAL LOW (ref 8.9–10.3)
Chloride: 87 mmol/L — ABNORMAL LOW (ref 101–111)
Creatinine, Ser: 0.56 mg/dL (ref 0.44–1.00)
GFR calc Af Amer: >60 mL/min (ref >60)
GFR calc non Af Amer: >60 mL/min (ref >60)
GLUCOSE: 130 mg/dL — AB (ref 65–99)
Potassium: 3.6 mmol/L (ref 3.5–5.1)
SODIUM: 134 mmol/L — AB (ref 135–145)
TOTAL PROTEIN: 5 g/dL — AB (ref 6.5–8.1)

## 2017-03-04 LAB — GLUCOSE, CAPILLARY
GLUCOSE-CAPILLARY: 88 mg/dL (ref 65–99)
GLUCOSE-CAPILLARY: 111 mg/dL — AB (ref 65–99)
Glucose-Capillary: 122 mg/dL — ABNORMAL HIGH (ref 65–99)
Glucose-Capillary: 112 mg/dL — ABNORMAL HIGH (ref 65–99)

## 2017-03-04 LAB — MAGNESIUM: MAGNESIUM: 1.9 mg/dL (ref 1.7–2.4)

## 2017-03-04 LAB — PHOSPHORUS: Phosphorus: 2.4 mg/dL — ABNORMAL LOW (ref 2.5–4.6)

## 2017-03-04 MED ORDER — HYDROMORPHONE HCL 4 MG PO TABS
2.0000 mg | ORAL_TABLET | ORAL | Status: DC | PRN
  Administered 2017-03-05 – 2017-03-10 (×19): 2 mg via ORAL
  Filled 2017-03-04 (×20): qty 1

## 2017-03-04 NOTE — Consult Note (Signed)
Consultation Note Date: 03/04/2017   Patient Name: Jessica Gregory  DOB: 1950/12/14  MRN: [id number]  Age / Sex: 67 y.o., female  PCP: Jessica Moron, Gregory Referring Physician: Ardath Sax, Gregory  Reason for Consultation: Establishing goals of care and Pain control  HPI/Patient Profile: 67 y.o. female  admitted on 02/05/2017   Clinical Assessment and Goals of Care: 67 yo with abd pain, partial bowel obstruction, widely metastatic malignancy including peritoneal carcinomatosis, hepatic lesions, lung involvement and malignant ascites. Hospital issues include waxing and waning mental status, recent resp failure, malnutrition.   A palliative consult has been requested for pain management and [place] discussions.   The patient is resting in chair, she is in no distress currently, daughter by bedside. How ever, during my visit, the patient periodically closes her eyes and begins to moan, daughter states that the IV Morphine helps.   We reviewed the patient's pain regimen in detail, briefly approached goals/wises and values.   NEXT OF KIN  Daughter Jessica Gregory who is primary caregiver and decision maker, she is the patient's only child.  The patient, her husband and daughter live together in [place], [place].    SUMMARY OF RECOMMENDATIONS    Add low dose PO Dilaudid PRN and monitor. Agree with not starting scheduled long acting opioids just yet.  Morphine IV PRN for rescue.  Full code for now, patient's daughter is primary caregiver and decision maker, she states that both herself and her father are aware of the patient's serious illness, they continue to consider scope of DNR DNI, to remain full code for now, to continue discussions.  Code Status/Advance Care Planning:  Full code     Symptom Management:  See above   Palliative Prophylaxis:   Bowel regimen.   Psycho-social/Spiritual:   Desire for further  Chaplaincy support no   Additional Recommendations: none   Prognosis:   < 12 months?  Discharge Planning: To Be Determined      Primary Diagnoses: Present on Admission: . Sepsis ([place])   I have reviewed the medical record, interviewed the patient and family, and examined the patient. The following aspects are pertinent.  Past Medical History:  Diagnosis Date  . Diabetes ([place])   . Language barrier 11/16/2014   From [place], does not yet speak [place]   Social History   Socioeconomic History  . Marital status: Married    Spouse name: None  . Number of children: None  . Years of education: None  . Highest education level: None  Social Needs  . Financial resource strain: Somewhat hard  . Food insecurity - worry: Sometimes true  . Food insecurity - inability: Sometimes true  . Transportation needs - medical: No  . Transportation needs - non-medical: No  Occupational History  . Occupation: work at [employer]  . Smoking status: Never Smoker  . Smokeless tobacco: Never Used  Substance and Sexual Activity  . Alcohol use: No  . Drug use: No  . Sexual activity: No  Other Topics Concern  .  None  Social History Narrative   Education: Other   Exercise: No   History reviewed. No pertinent family history. Scheduled Meds: . Chlorhexidine Gluconate Cloth  6 each Topical Q0600  . enoxaparin (LOVENOX) injection  40 mg Subcutaneous Q24H  . feeding supplement  1 Container Oral TID BM  . furosemide  20 mg Oral Daily  . insulin aspart  0-9 Units Subcutaneous TID WC  . multivitamin  15 mL Oral Daily  . pantoprazole (PROTONIX) IV  40 mg Intravenous Daily  . protein supplement  8 oz Oral BID  . psyllium  1 packet Oral Daily  . senna-docusate  1 tablet Oral BID  . simethicone  40 mg Oral QID  . sodium chloride flush  10-40 mL Intracatheter Q12H   Continuous Infusions: . piperacillin-tazobactam (ZOSYN)  IV 3.375 g (03/04/17 1736)  . vancomycin Stopped (03/04/17  0100)   PRN Meds:.HYDROmorphone, levalbuterol, LORazepam, morphine injection, polyethylene glycol, prochlorperazine, sodium chloride flush, sodium chloride flush, sodium chloride flush Medications Prior to Admission:  Prior to Admission medications   Medication Sig Start Date End Date Taking? Authorizing Provider  metFORMIN (GLUCOPHAGE) 500 MG tablet Take 1 tablet (500 mg total) by mouth 2 (two) times daily with a meal. 08/01/16  Yes Stallings, Jessica Gregory  Phenylephrine-DM-GG-APAP (TYLENOL COLD/FLU SEVERE PO) Take 2 tablets by mouth every 6 (six) hours as needed (cold/flu symptoms).   Yes Provider, Historical, Gregory  canagliflozin (INVOKANA) 100 MG TABS tablet Take 1 tablet (100 mg total) by mouth daily before breakfast. Patient not taking: Reported on 02/26/2017 08/01/16   Jessica Moron, Gregory  clobetasol cream (TEMOVATE) 1.61 % Apply 1 application topically 2 (two) times daily. Patient not taking: Reported on 01/31/2017 08/01/16   Jessica Moron, Gregory   No Known Allergies Review of Systems +abd pain Physical Exam Asian lady resting in chair abd distension Diminished bases S1 S2 No edema Thin muscle wasting  Vital Signs: BP 134/74 (BP Location: Right Arm)   Pulse (!) 115   Temp 97.6 F (36.4 C) (Axillary)   Resp 16   Ht 4' 10.5" (1.486 m)   Wt 56.4 kg (124 lb 5.4 oz)   SpO2 100%   BMI 25.54 kg/m  Pain Assessment: No/denies pain POSS *See Group Information*: 1-Acceptable,Awake and alert Pain Score: 8    SpO2: SpO2: 100 % O2 Device:SpO2: 100 % O2 Flow Rate: .O2 Flow Rate (L/min): 4 L/min  IO: Intake/output summary:   Intake/Output Summary (Last 24 hours) at 03/04/2017 2044 Last data filed at 03/04/2017 1106 Gross per 24 hour  Intake 330 ml  Output -  Net 330 ml    LBM: Last BM Date: 03/04/17 Baseline Weight: Weight: 55.2 kg (121 lb 11.1 oz) Most recent weight: Weight: 56.4 kg (124 lb 5.4 oz)     Palliative Assessment/Data:    PPS 30% Time In: 1600 Time Out: 1700 Time  Total: 60 min Greater than 50%  of this time was spent counseling and coordinating care related to the above assessment and plan.  Signed by: Loistine Chance, Gregory  [phone]  Please contact Palliative Medicine Team phone at [phone] for questions and concerns.  For individual provider: See Shea Evans

## 2017-03-04 NOTE — Progress Notes (Signed)
IP PROGRESS NOTE  Subjective:  Pain poorly controlled through the night.  Somewhat better in the morning with intermittent morphine doses.  Continues to pass liquid flatus.  One episode of emesis last night, but none since.  No recurrent fever.  Objective: Vital signs in last 24 hours: Blood pressure (!) 141/75, pulse (!) 111, temperature (!) 97.4 F (36.3 C), temperature source Axillary, resp. rate 20, height 4' 10.5" (1.486 m), weight 124 lb 5.4 oz (56.4 kg), SpO2 100 %.  Intake/Output from previous day: 02/04 0701 - 02/05 0700 In: 600 [IV Piggyback:600] Out: -   Physical Exam: Patient is laying in bed in fetal position resisting attempts to reposition her.  Unable to answer questions coherently   HEENT: Anicteric sclera, moist mucous membranes Lungs:  Gasping breaths.  Decreased breath sounds bilaterally with expiratory wheezing and frequent nonproductive cough Cardiac: S1/S2, regular, no murmurs Abdomen: Increased distention with tympanic sounds to percussion. Lymph nodes: No palpable lymphadenopathy in the cervical, supraclavicular, axillary, or inguinal regions Neurologic: Unable to evaluate today Skin: No rash, petechiae, or ecchymosis. Musculoskeletal: Bilateral lower extremity swelling.   Lab Results: Recent Labs    03/03/17 0334 03/04/17 0435  WBC 6.9 13.3*  HGB 10.8* 10.2*  HCT 34.0* 33.0*  PLT 159 167    BMET Recent Labs    03/03/17 0334 03/04/17 0435  NA 131* 134*  K 3.4* 3.6  CL 84* 87*  CO2 36* 33*  GLUCOSE 91 130*  BUN 19 11  CREATININE 0.66 0.56  CALCIUM 7.8* 7.5*    Lab Results  Component Value Date   CEA1 0.9 02/19/2017    Studies/Results: Ct Abdomen Pelvis W Contrast  Result Date: 03/02/2017 CLINICAL DATA:  Foreign body in urethra.  Stage IV cancer. EXAM: CT ABDOMEN AND PELVIS WITH CONTRAST TECHNIQUE: Multidetector CT imaging of the abdomen and pelvis was performed using the standard protocol following bolus administration of intravenous  contrast. CONTRAST:  156mL ISOVUE-300 IOPAMIDOL (ISOVUE-300) INJECTION 61% COMPARISON:  02/14/2017. FINDINGS: Lower chest: Innumerable pulmonary nodules/metastases. Mild patchy right lower lobe opacity, possibly atelectasis. Hepatobiliary: Abnormal appearance of the gallbladder fundus (series 2/image 34), suggesting gallbladder adenocarcinoma, with direct mass-like invasion of the adjacent hepatic parenchyma. Combined tumor involving the liver and gallbladder measures approximately 5.1 x 8.7 x 6.4 cm (series 2/image 32; coronal image 32). 2.1 cm hepatic metastasis in segment 8 (series 2/image 24). These findings are mildly progressive from recent CT. Pancreas: Within normal limits. Spleen: Within normal limits. Adrenals/Urinary Tract: Adrenal glands are within normal limits. 9 mm cyst in the posterior right lower kidney (series 2/image 43). Left kidney is within normal limits. No hydronephrosis. Bladder is within normal limits. Stomach/Bowel: Stomach is within normal limits. Dilated loops of small bowel in the right mid abdomen (series 2/image 70), with decompressed loops of terminal ileum (series 2/image 64), raising the possibility of small bowel obstruction. However, the colon is nondilated and in fact mildly distended and fluid-filled. As such, this overall appearance may reflect adynamic ileus, particularly if this patient is on narcotic pain medication. Vascular/Lymphatic: No evidence of abdominal aortic aneurysm. Atherosclerotic calcifications of the abdominal aorta and branch vessels. Small upper abdominal lymph nodes. Widespread omental caking (for example, series 2/image 49). Scattered peritoneal implants, including a dominant 4.0 cm implant in the right mid abdomen (series 2/image 39). Additional implants in the left upper abdomen anterior to the spleen. This appearance is grossly unchanged. Reproductive: Uterus is grossly unremarkable. No adnexal masses. Other: Moderate abdominopelvic ascites, partially  loculated with  mass effect beneath the right mid abdominal wall (series 2/image 45, reflecting malignant ascites. This appearance is grossly unchanged. A punctate radiodensity is present at the urethral meatus (series 2/image 95) and was also present on prior CT. This may be related to the patient's known plastic (non radiopaque) foreign body. However, this is not well visualized on CT. Musculoskeletal: Visualized osseous structures are within normal limits. IMPRESSION: Punctate radiodensity at the urethral meatus, possibly related to the patient's known plastic (non radiopaque) foreign body, not well visualized on CT. 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 Associated hepatic metastasis. Associated widespread peritoneal disease with omental caking and ascites, grossly unchanged. Dilated loops of small bowel in the right mid abdomen, favoring adynamic ileus due to lack of colonic decompression. Widespread pulmonary metastases, incompletely visualized. Electronically Signed   By: Julian Hy M.D.   On: 03/02/2017 14:03   Dg Chest Port 1 View  Result Date: 03/02/2017 CLINICAL DATA:  Acute respiratory failure. EXAM: PORTABLE CHEST 1 VIEW COMPARISON:  02/26/2017. FINDINGS: Normal sized heart. Left PICC tip in the upper right atrium. Increased diffuse prominence of the pulmonary vasculature and interstitial markings with small bilateral pleural effusions. No acute bony abnormality. IMPRESSION: Mild worsening of changes of congestive heart failure. Electronically Signed   By: Claudie Revering M.D.   On: 03/02/2017 11:18    Medications: I have reviewed the patient's current medications.  Assessment/Plan: 67 year old female who presented with abdominal pain and partial bowel obstruction with findings of widely metastatic malignancy including peritoneal carcinomatosis, hepatic lesions, and multifocal bilateral pulmonary involvement.  Peritoneal fluid  positive for presence of cells consistent with adenocarcinoma.    **SBO, partial/Ileus due to opioids: Recent CT of the abdomen pelvis demonstrates persistent small bowel distention consistent with at least partial obstruction.  In addition to that,: Appears distended with retention of stool which is likely due to opioid medications. --Unfortunately, cannot use methylnaltrexone due to presence of bowel obstruction. -Continue gentle laxatives and reduce opioid doses   --Senna/Docusate + Psyllum  --Miralax PRN  **Sepsis/Delirium: Unclear if combination of severe pain, opioid medications, and possible infection combined to contribute to the presentation last night.  CT of the abdomen/pelvis demonstrates no evidence of free air in the peritoneum.  Essentially stable appearance of the known malignancy.  A possible foreign body is noted in the urethra.  Differential for that includes a nephrolith versus portion of the pessary --Continue piperacillin/tazobactam and vancomycin due to febrile episode last night and possibility of developing neutropenia in the setting of recent systemic chemotherapy.  If patient is afebrile over 72 hours, will discontinue antibiotics will complete treatment--tomorrow morning if no additional febrile spikes of hypothermia.. --Will limit opioid medications to short-acting forms only apparently does not tolerate MS Contin that has been tried several times  **Respiratory failure mixed hypoxemic/hypercarbic: Likely due to combination of malignant infiltration of the lungs including lung infiltration by underlying malignancy including lymphangitic spread, pleural effusion secondary to the malignant ascites, and effects of significant levels of opioid medications delivered for pain control.  Seen and evaluated by pulmonology who is recommendations and input are greatly appreciated.  Bicarbonate level down to 36 today, likely due to diuresis with furosemide.  Echocardiogram was obtained  02/27/17 demonstrating preserved left ventricular ejection fraction.  Weight is stable over the past several days, I do not think that the fluid retention is the current problem although patient's lower extremity swelling has increased since yesterday. --Continue daily weights --Continue furosemide 20  mg daily due to weight gain likely representing swelling lower extremities.  Effectiveness of furosemide is likely limited by profound hypoalbuminemia.  Will consider administering intravenous albumin tomorrow to improve diuresis.  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  Nevertheless, distribution of the disease as well as elevation of CA 125 and normal CA19-9 with noncontributory immunohistochemical profile favor primary peritoneal or ovarian etiology. Received the initial chemotherapy with carboplatin AUC 5 + paclitaxel 125 mg/m Q21d on 02/21/17.  Repeat CT scan of the abdomen and pelvis demonstrates essentially stable findings.  Radiographic appearance is still very worrisome for possible primary gallbladder carcinoma based on the thickened gallbladder and appearance of direct hepatic invasion.  At this time, patient has received the first dose of systemic chemotherapy and no additional treatment will be administered until 03/14/17.  My hope is that the patient is discharged from hospital by that time to follow-up as outpatient. --Daily labs --Too early to assess treatment response at this time  **Recurrent malignant ascites: Due to underlying malignancy.  Will reaccumulate rapidly until the cancer is addressed. Paracentesis 02/21/17, 02/24/17.  Persistent significant pain in the abdomen with increased bowel output.  Increasing pain is more likely to be associated with increased bowel mobility. --We will abstain from additional  paracentesis at this point in time unless abdominal distention increases significantly.  **Cancer-associated pain: Patient seems to be able to stay awake enough at this current medication levels with no uncontrolled pain.  Pain has improved significantly last night after removal of the retained pessary.  This morning, increasing abdominal pain likely due to combination of small bowel obstruction, malignancy, and constipation due to opioids.  Abstain from future use of long-acting opioids due to negative effect on bowel motility and mentation less patient chooses to proceed with comfort-only management style. --Current pain regimen:     --MSIR 4mg  IV Q1hrs PRN Pain =>5 --If adequate pain control is obtained on IV, will consider attempting transition to short-acting pain medication such as hydromorphone. -Agree with palliative care consult to assist me in the symptom management  **Diabetes mellitus: As expected, glucose is higher and now that patient has received glucocorticoids with her chemotherapy. --Continue SSI for now, may have to increase to higher grade scale  **Malnutrition, combined calorie/protein, severe: Severe decrease in oral intake due to peritoneal carcinomatosis and partial bowel obstruction with pain exacerbated by food intake.  Patient has been seen by dietitian.  Phosphorus level has stabilized, albumin is slightly improved compared to the prior days. --Resume diet and advance as tolerated  **Electrolytes/Fluids:  Hyponatremia: Complex hyponatremia due to intravascular volume depletion, pain, nausea, metastatic disease in the liver.  Na improving today  Hypokalemia: Potassium has dropped likely due to increased urine output and insufficient oral intake.  Replaced with IV KCl/KPhos, resolved without recurrence. --Resuming furosemide, will administer IV potassium replacement today.   **PPx:  --Continue pantoprazole for stress ulcer prophylaxis  --Start enoxaparin 40 mg subcu  daily for DVT prophylaxis  **Code Status:  --Full code: Previously, patient was DNR/DNI, but we had additional conversation yesterday and patient insists on revival to be attempted in case of cardiac or respiratory arrest.  Have explained that her likelihood of successful revival is negligible, likely less than 1% based on her extensive malignancy.  CODE STATUS reviewed again today outlining the poor prognosis in case of need for resuscitation.  Family continues to maintain for the  patient to remain full code.  **Disposition:  --Unclear at this time.  Need to establish reliable pain control as well and has improved bowel motility prior to the patient being able to return home.  Patient has a very supportive family who assist her in mobility and self-cares.    LOS: 18 days   Ardath Sax, MD   03/04/2017, 8:32 AM

## 2017-03-05 LAB — COMPREHENSIVE METABOLIC PANEL
ALT: 30 U/L (ref 14–54)
ANION GAP: 16 — AB (ref 5–15)
AST: 25 U/L (ref 15–41)
Albumin: 1.9 g/dL — ABNORMAL LOW (ref 3.5–5.0)
Alkaline Phosphatase: 95 U/L (ref 38–126)
BUN: 10 mg/dL (ref 6–20)
CO2: 31 mmol/L (ref 22–32)
CREATININE: 0.5 mg/dL (ref 0.44–1.00)
Calcium: 7.5 mg/dL — ABNORMAL LOW (ref 8.9–10.3)
Chloride: 87 mmol/L — ABNORMAL LOW (ref 101–111)
Glucose, Bld: 126 mg/dL — ABNORMAL HIGH (ref 65–99)
POTASSIUM: 2.8 mmol/L — AB (ref 3.5–5.1)
SODIUM: 134 mmol/L — AB (ref 135–145)
Total Bilirubin: 0.9 mg/dL (ref 0.3–1.2)
Total Protein: 5 g/dL — ABNORMAL LOW (ref 6.5–8.1)

## 2017-03-05 LAB — CBC WITH DIFFERENTIAL/PLATELET
BASOS ABS: 0 10*3/uL (ref 0.0–0.1)
BASOS PCT: 0 %
Eosinophils Absolute: 0 10*3/uL (ref 0.0–0.7)
Eosinophils Relative: 0 %
HCT: 33.7 % — ABNORMAL LOW (ref 36.0–46.0)
Hemoglobin: 10.4 g/dL — ABNORMAL LOW (ref 12.0–15.0)
LYMPHS ABS: 1.8 10*3/uL (ref 0.7–4.0)
LYMPHS PCT: 11 %
MCH: 25.1 pg — ABNORMAL LOW (ref 26.0–34.0)
MCHC: 30.9 g/dL (ref 30.0–36.0)
MCV: 81.2 fL (ref 78.0–100.0)
MONO ABS: 1.3 10*3/uL — AB (ref 0.1–1.0)
Monocytes Relative: 8 %
NEUTROS ABS: 13.1 10*3/uL — AB (ref 1.7–7.7)
Neutrophils Relative %: 81 %
PLATELETS: 185 10*3/uL (ref 150–400)
RBC: 4.15 MIL/uL (ref 3.87–5.11)
RDW: 14.5 % (ref 11.5–15.5)
WBC: 16.2 10*3/uL — ABNORMAL HIGH (ref 4.0–10.5)

## 2017-03-05 LAB — GLUCOSE, CAPILLARY
GLUCOSE-CAPILLARY: 121 mg/dL — AB (ref 65–99)
GLUCOSE-CAPILLARY: 137 mg/dL — AB (ref 65–99)
GLUCOSE-CAPILLARY: 85 mg/dL (ref 65–99)
Glucose-Capillary: 118 mg/dL — ABNORMAL HIGH (ref 65–99)
Glucose-Capillary: 108 mg/dL — ABNORMAL HIGH (ref 65–99)
Glucose-Capillary: 127 mg/dL — ABNORMAL HIGH (ref 65–99)

## 2017-03-05 MED ORDER — POTASSIUM CHLORIDE 10 MEQ/50ML IV SOLN
10.0000 meq | INTRAVENOUS | Status: AC
  Administered 2017-03-05 (×6): 10 meq via INTRAVENOUS
  Filled 2017-03-05 (×6): qty 50

## 2017-03-05 MED ORDER — ALBUMIN HUMAN 25 % IV SOLN
50.0000 g | Freq: Once | INTRAVENOUS | Status: AC
  Administered 2017-03-05: 50 g via INTRAVENOUS
  Filled 2017-03-05: qty 200

## 2017-03-05 MED ORDER — ALBUMIN HUMAN 25 % IV SOLN
12.5000 g | Freq: Once | INTRAVENOUS | Status: AC
  Administered 2017-03-05: 12.5 g via INTRAVENOUS
  Filled 2017-03-05: qty 50

## 2017-03-05 NOTE — Progress Notes (Signed)
IP PROGRESS NOTE  Subjective:  Intermittent bouts of agitation due to pain followed by ability to gain some sleep with addition of hydromorphone on suggestion of palliative care consult yesterday.  Presently sleeping with apparently comfortable respiration.  Objective: Vital signs in last 24 hours: Blood pressure 116/72, pulse (!) 119, temperature 98.2 F (36.8 C), temperature source Oral, resp. rate 18, height 4' 10.5" (1.486 m), weight 124 lb 5.4 oz (56.4 kg), SpO2 100 %.  Intake/Output from previous day: 02/05 0701 - 02/06 0700 In: 30 [P.O.:30] Out: -   Physical Exam: Patient is laying in bed sleeping with comfortable breathing and no acute signs of distress. HEENT: Anicteric sclera, moist mucous membranes Lungs: Decreased breath sounds bilaterally with expiratory wheezing and frequent nonproductive cough Cardiac: S1/S2, regular, no murmurs Abdomen: Stable abdominal distention with diffuse tenderness  Lymph nodes: No palpable lymphadenopathy in the cervical, supraclavicular, axillary, or inguinal regions Neurologic: Unable to evaluate today Skin: No rash, petechiae, or ecchymosis. Musculoskeletal: Bilateral lower extremity swelling.   Lab Results: Recent Labs    03/04/17 0435 03/05/17 0500  WBC 13.3* 16.2*  HGB 10.2* 10.4*  HCT 33.0* 33.7*  PLT 167 185    BMET Recent Labs    03/04/17 0435 03/05/17 0500  NA 134* 134*  K 3.6 2.8*  CL 87* 87*  CO2 33* 31  GLUCOSE 130* 126*  BUN 11 10  CREATININE 0.56 0.50  CALCIUM 7.5* 7.5*    Lab Results  Component Value Date   CEA1 0.9 02/19/2017    Studies/Results: No results found.  Medications: I have reviewed the patient's current medications.  Assessment/Plan: 67 year old female who presented with abdominal pain and partial bowel obstruction with findings of widely metastatic malignancy including peritoneal carcinomatosis, hepatic lesions, and multifocal bilateral pulmonary involvement.  Peritoneal fluid positive  for presence of cells consistent with adenocarcinoma.    **SBO, partial/Ileus due to opioids: Recent CT of the abdomen pelvis demonstrates persistent small bowel distention consistent with at least partial obstruction.  In addition to that,: Appears distended with retention of stool which is likely due to opioid medications. --Unfortunately, cannot use methylnaltrexone due to presence of bowel obstruction. -Continue gentle laxatives and reduce opioid doses   --Senna/Docusate + Psyllum  --Miralax PRN  **Sepsis/Delirium: Unclear if combination of severe pain, opioid medications, and possible infection combined to contribute to the presentation last night.  CT of the abdomen/pelvis demonstrates no evidence of free air in the peritoneum.  Essentially stable appearance of the known malignancy.  A possible foreign body is noted in the urethra.  Differential for that includes a nephrolith versus portion of the pessary --Discontinue piperacillin/tazobactam and vancomycin today --Will limit opioid medications to short-acting forms only apparently does not tolerate MS Contin that has been tried several times  **Respiratory failure mixed hypoxemic/hypercarbic: Likely due to combination of malignant infiltration of the lungs including lung infiltration by underlying malignancy including lymphangitic spread, pleural effusion secondary to the malignant ascites, and effects of significant levels of opioid medications delivered for pain control.  Seen and evaluated by pulmonology who is recommendations and input are greatly appreciated.  Bicarbonate level down to 36 today, likely due to diuresis with furosemide.  Echocardiogram was obtained 02/27/17 demonstrating preserved left ventricular ejection fraction.  Weight is stable over the past several days, I do not think that the fluid retention is the current problem although patient's lower extremity swelling has increased since yesterday. --Continue daily  weights --Continue furosemide 20 mg daily due to weight gain likely  representing swelling lower extremities.  Effectiveness of furosemide is likely limited by profound hypoalbuminemia.  Will consider administering intravenous albumin tomorrow to improve diuresis. 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  Nevertheless, distribution of the disease as well as elevation of CA 125 and normal CA19-9 with noncontributory immunohistochemical profile favor primary peritoneal or ovarian etiology. Received the initial chemotherapy with carboplatin AUC 5 + paclitaxel 125 mg/m Q21d on 02/21/17.  Repeat CT scan of the abdomen and pelvis demonstrates essentially stable findings.  Radiographic appearance is still very worrisome for possible primary gallbladder carcinoma based on the thickened gallbladder and appearance of direct hepatic invasion.  At this time, patient has received the first dose of systemic chemotherapy and no additional treatment will be administered until 03/14/17.  My hope is that the patient is discharged from hospital by that time to follow-up as outpatient. --Daily labs --Too early to assess treatment response at this time  **Recurrent malignant ascites: Due to underlying malignancy.  Will reaccumulate rapidly until the cancer is addressed. Paracentesis 02/21/17, 02/24/17.  Persistent significant pain in the abdomen with increased bowel output.  Increasing pain is more likely to be associated with increased bowel mobility. --We will abstain from additional paracentesis at this point in time unless abdominal distention increases significantly.  **Cancer-associated pain: Patient seems to be able to stay  awake enough at this current medication levels with no uncontrolled pain.  Pain has improved significantly last night after removal of the retained pessary.  This morning, increasing abdominal pain likely due to combination of small bowel obstruction, malignancy, and constipation due to opioids.  Abstain from future use of long-acting opioids due to negative effect on bowel motility and mentation less patient chooses to proceed with comfort-only management style.  Appreciate input from palliative care consultation. --Current pain regimen:     --MSIR 4mg  IV Q1hrs PRN Pain =>5   --Hydromorphone 2 mg oral every 3 hours as needed  **Diabetes mellitus: As expected, glucose is higher and now that patient has received glucocorticoids with her chemotherapy. --Continue SSI for now, may have to increase to higher grade scale  **Malnutrition, combined calorie/protein, severe: Severe decrease in oral intake due to peritoneal carcinomatosis and partial bowel obstruction with pain exacerbated by food intake.  Patient has been seen by dietitian.  Phosphorus level has stabilized, albumin is slightly improved compared to the prior days. --Resume diet and advance as tolerated  **Electrolytes/Fluids:  Hyponatremia: Complex hyponatremia due to intravascular volume depletion, pain, nausea, metastatic disease in the liver.  Na improving today  Hypokalemia: Potassium has dropped likely due to increased urine output and insufficient oral intake.  Replaced with IV KCl/KPhos, resolved without recurrence. --Resuming furosemide, will administer IV potassium replacement today.   **PPx:  --Continue pantoprazole for stress ulcer prophylaxis  --Start enoxaparin 40 mg subcu daily for DVT prophylaxis  **Code Status:  --Full code: Previously, patient was DNR/DNI, but we had additional conversation yesterday and patient insists on revival to be attempted in case of cardiac or respiratory arrest.  Have explained that her  likelihood of successful revival is negligible, likely less than 1% based on her extensive malignancy.  CODE STATUS reviewed again today outlining the poor prognosis in case of need for resuscitation.  Family continues to maintain for the  patient to remain full code.  **Disposition:  --At this time, I am becoming increasingly concerned that patient course is not going to turn around.  Previously, patient and her family were not ready to accept the potential negative outcomes of this hospitalization.  But it is becoming more evident that I will treatment is unlikely to result in significant symptomatic improvement for the patient.  Our discussion with patient's daughter today outlined the plan to await and continue support until Friday.  Will conduct additional family meeting on Friday to decide whether we will continue with cancer directed therapy or switch to hospice.    LOS: 19 days   Ardath Sax, MD   03/05/2017, 9:16 AM

## 2017-03-05 NOTE — Progress Notes (Signed)
Daily Progress Note   Patient Name: Jessica Gregory       Date: 03/05/2017 DOB: 07-30-1950  Age: 67 y.o. MRN#: [id number] Attending Physician: Ardath Sax, MD Primary Care Physician: Forrest Moron, MD Admit Date: 02/15/2017  Reason for Consultation/Follow-up: Establishing goals of care, Pain control and Psychosocial/spiritual support  Subjective:  patient is resting in bed, she is mostly asleep, but awakens and clutches her abdomen, at times, she is also moaning and appears in at least mild to moderate degree of discomfort.   Daughter Jessica Gregory is at bedside, patient states she feels more comfortable with Jessica Gregory translating for her, she trusts her daughter completely and wants her to be her primary decision maker.   Having several loose BMs  Extensive discussions with daughter Jessica Gregory at the patient's bedside this morning.  See below.   Length of Stay: 19  Current Medications: Scheduled Meds:  . Chlorhexidine Gluconate Cloth  6 each Topical Q0600  . enoxaparin (LOVENOX) injection  40 mg Subcutaneous Q24H  . feeding supplement  1 Container Oral TID BM  . furosemide  20 mg Oral Daily  . insulin aspart  0-9 Units Subcutaneous TID WC  . multivitamin  15 mL Oral Daily  . pantoprazole (PROTONIX) IV  40 mg Intravenous Daily  . protein supplement  8 oz Oral BID  . psyllium  1 packet Oral Daily  . senna-docusate  1 tablet Oral BID  . simethicone  40 mg Oral QID  . sodium chloride flush  10-40 mL Intracatheter Q12H    Continuous Infusions: . potassium chloride     PPS 30%  PRN Meds: HYDROmorphone, levalbuterol, LORazepam, morphine injection, polyethylene glycol, prochlorperazine, sodium chloride flush, sodium chloride flush, sodium chloride flush  Physical Exam         Awake but in  distress S1 S2 Abdomen distended Lungs diminished bases No edema Muscle wasting evident   Vital Signs: BP 116/72 (BP Location: Right Arm)   Pulse (!) 119   Temp 98.2 F (36.8 C) (Oral)   Resp 18   Ht 4' 10.5" (1.486 m)   Wt 56.4 kg (124 lb 5.4 oz)   SpO2 100%   BMI 25.54 kg/m  SpO2: SpO2: 100 % O2 Device: O2 Device: Nasal Cannula O2 Flow Rate: O2 Flow Rate (L/min): 4 L/min  Intake/output summary:  Intake/Output Summary (Last 24 hours) at 03/05/2017 1017 Last data filed at 03/04/2017 1106 Gross per 24 hour  Intake 30 ml  Output -  Net 30 ml   LBM: Last BM Date: 03/04/17 Baseline Weight: Weight: 55.2 kg (121 lb 11.1 oz) Most recent weight: Weight: 56.4 kg (124 lb 5.4 oz)       Palliative Assessment/Data:      Patient Active Problem List   Diagnosis Date Noted  . Constipation 03/03/2017  . Fever   . Abdominal distention   . Acute delirium   . Hyponatremia   . Acute respiratory failure ([place]) 02/26/2017  . Electrolyte imbalance 02/26/2017  . Hypokalemia 02/25/2017  . Intra-abdominal malignant neoplasm ([place])   . Ascites   . Cancer associated pain 02/21/2017  . Nausea   . Admission for chemotherapy   . Peritoneal carcinomatosis ([place])   . Metastatic cancer ([place])   . Sepsis ([place]) 02/14/2017  . Diffuse papular rash 08/01/2016  . Psoriasis 08/01/2016  . Chronic hepatitis B ([place]) 08/01/2016  . Hepatitis, viral 03/01/2015  . Controlled type 2 diabetes mellitus without complication, without long-term current use of insulin ([place]) 03/01/2015  . Uncontrolled diabetes mellitus ([place]) 11/21/2014  . Language barrier 11/16/2014    Palliative Care Assessment & Plan   Patient Profile:   Assessment: SBO Sepsis/delirium multi factorial. pulm mets  Possible GB malignancy or Possible ovarian malignancy Recurrent malignant ascites.  Uncontrolled multi factorial pain.  Cancer cachexia Cancer anorexia Severe calorie/protein  malnutrition  Recommendations/Plan:  Family meeting with the patient's daughter at the bedside for an extended period of time this morning. Reviewed patient's CT scan, discussed about the above conditions most of which are stemming directly from the patient's malignancy, her ongoing decline, functional and cognitive discussed in detail.   Goals, wishes and values re discussed. Patient's daughter states that she wants to do everything possible to prolong the patient's life. We discussed about life prolongation with the aid of tubes/machines versus a more palliative/comfort based approach to her care in detail.   We reviewed how recurrent paracentesis might be a symptom management measure, but is not a cure.  The patient's daughter states that she feels overwhelmed, she states this is a big decision for her to make, she does not want to have guilt. We discussed extensively about scope of DNR DNI versus a resuscitation attempt.  We will continue to address symptom management needs, while continuing goals of care discussions. The patient, in my opinion, is unfortunately on a rapidly progressive decline trajectory. We will continue to follow along.   Code Status:    Code Status Orders  (From admission, onward)        Start     Ordered   02/27/17 1315  Full code  Continuous     02/27/17 1314    Code Status History    Date Active Date Inactive Code Status Order ID Comments User Context   02/26/2017 08:05 02/27/2017 13:14 DNR [id number]  Ardath Sax, MD Inpatient   02/14/2017 00:35 02/26/2017 08:05 Full Code [id number]  Damita Lack, MD ED       Prognosis:   guarded   Discharge Planning:  To Be Determined  Care plan was discussed with patient,daughter.   Thank you for allowing the Palliative Medicine Team to assist in the care of this patient.   Time In: 9 Time Out: 10 Total Time 60 min  Prolonged Time Billed  no       Greater than 50%  of this time was spent counseling  and coordinating care related to the above assessment and plan.  Loistine Chance, MD [phone]  Please contact Palliative Medicine Team phone at [phone] for questions and concerns.

## 2017-03-06 DIAGNOSIS — K5903 Drug induced constipation: Secondary | ICD-10-CM

## 2017-03-06 DIAGNOSIS — K56609 Unspecified intestinal obstruction, unspecified as to partial versus complete obstruction: Secondary | ICD-10-CM

## 2017-03-06 DIAGNOSIS — K5669 Other partial intestinal obstruction: Secondary | ICD-10-CM

## 2017-03-06 LAB — COMPREHENSIVE METABOLIC PANEL
ALBUMIN: 3 g/dL — AB (ref 3.5–5.0)
ALK PHOS: 80 U/L (ref 38–126)
ALT: 29 U/L (ref 14–54)
ANION GAP: 11 (ref 5–15)
AST: 25 U/L (ref 15–41)
BUN: 9 mg/dL (ref 6–20)
CALCIUM: 8.1 mg/dL — AB (ref 8.9–10.3)
CO2: 34 mmol/L — ABNORMAL HIGH (ref 22–32)
Chloride: 91 mmol/L — ABNORMAL LOW (ref 101–111)
Creatinine, Ser: 0.43 mg/dL — ABNORMAL LOW (ref 0.44–1.00)
Glucose, Bld: 119 mg/dL — ABNORMAL HIGH (ref 65–99)
Potassium: 3.4 mmol/L — ABNORMAL LOW (ref 3.5–5.1)
Sodium: 136 mmol/L (ref 135–145)
TOTAL PROTEIN: 5.7 g/dL — AB (ref 6.5–8.1)
Total Bilirubin: 0.9 mg/dL (ref 0.3–1.2)

## 2017-03-06 LAB — CBC WITH DIFFERENTIAL/PLATELET
BASOS ABS: 0 10*3/uL (ref 0.0–0.1)
BASOS PCT: 0 %
Eosinophils Absolute: 0 10*3/uL (ref 0.0–0.7)
Eosinophils Relative: 0 %
HEMATOCRIT: 32.7 % — AB (ref 36.0–46.0)
Hemoglobin: 10.1 g/dL — ABNORMAL LOW (ref 12.0–15.0)
LYMPHS ABS: 2 10*3/uL (ref 0.7–4.0)
Lymphocytes Relative: 13 %
MCH: 25.3 pg — AB (ref 26.0–34.0)
MCHC: 30.9 g/dL (ref 30.0–36.0)
MCV: 81.8 fL (ref 78.0–100.0)
MONOS PCT: 6 %
Monocytes Absolute: 0.9 10*3/uL (ref 0.1–1.0)
NEUTROS ABS: 12.3 10*3/uL — AB (ref 1.7–7.7)
Neutrophils Relative %: 81 %
Platelets: 196 10*3/uL (ref 150–400)
RBC: 4 MIL/uL (ref 3.87–5.11)
RDW: 14.6 % (ref 11.5–15.5)
WBC: 15.2 10*3/uL — ABNORMAL HIGH (ref 4.0–10.5)

## 2017-03-06 LAB — MAGNESIUM: MAGNESIUM: 2 mg/dL (ref 1.7–2.4)

## 2017-03-06 LAB — GLUCOSE, CAPILLARY
Glucose-Capillary: 114 mg/dL — ABNORMAL HIGH (ref 65–99)
Glucose-Capillary: 140 mg/dL — ABNORMAL HIGH (ref 65–99)
Glucose-Capillary: 187 mg/dL — ABNORMAL HIGH (ref 65–99)
Glucose-Capillary: 117 mg/dL — ABNORMAL HIGH (ref 65–99)

## 2017-03-06 LAB — PHOSPHORUS: PHOSPHORUS: 1.3 mg/dL — AB (ref 2.5–4.6)

## 2017-03-06 MED ORDER — POTASSIUM PHOSPHATES 15 MMOLE/5ML IV SOLN
40.0000 meq | Freq: Once | INTRAVENOUS | Status: AC
  Administered 2017-03-06: 40 meq via INTRAVENOUS
  Filled 2017-03-06 (×2): qty 9.09

## 2017-03-06 MED ORDER — GUAIFENESIN-DM 100-10 MG/5ML PO SYRP
5.0000 mL | ORAL_SOLUTION | Freq: Four times a day (QID) | ORAL | Status: DC | PRN
  Administered 2017-03-06 – 2017-03-08 (×3): 5 mL via ORAL
  Administered 2017-03-10: 19:00:00 via ORAL
  Administered 2017-03-13 (×2): 5 mL via ORAL
  Filled 2017-03-06 (×7): qty 10

## 2017-03-06 MED ORDER — MORPHINE SULFATE (PF) 4 MG/ML IV SOLN
4.0000 mg | INTRAVENOUS | Status: DC | PRN
  Filled 2017-03-06: qty 1

## 2017-03-06 MED ORDER — FUROSEMIDE 10 MG/ML IJ SOLN
20.0000 mg | Freq: Once | INTRAMUSCULAR | Status: AC
  Administered 2017-03-06: 20 mg via INTRAVENOUS
  Filled 2017-03-06: qty 2

## 2017-03-06 NOTE — Progress Notes (Signed)
OT Cancellation Note  Patient Details Name: [name] MRN: [id number] DOB: [birth year]   Cancelled Treatment:    Reason Eval/Treat Not Completed: Fatigue/lethargy limiting ability to participate  Will check back in morning per family request. Kari Baars, [place]  Payton Mccallum D 03/06/2017, 1:49 PM

## 2017-03-06 NOTE — Progress Notes (Signed)
PT Cancellation Note  Patient Details Name: Jarvis Knodel MRN: [id number] DOB: 16-Dec-1950   Cancelled Treatment:    Reason Eval/Treat Not Completed: Patient declined, no reason specified Patient declines PT this morning (family present and acted as translators) due to fatigue, willing to attempt later in the day. Plan to attempt to return as/if schedule allows.    Deniece Ree PT, DPT, CBIS  Supplemental Physical Therapist [facility]   Pager [phone]

## 2017-03-06 NOTE — Progress Notes (Signed)
Physical Therapy Treatment Patient Details Name: Jessica Gregory MRN: [id number] DOB: Jun 27, 1950 Today's Date: 03/06/2017    History of Present Illness 67 y.o. female with medical history significant of history of diabetes mellitus type 2, hyperlipidemia was sent to the ER for further evaluation due to abnormal CT scan and and patient feeling generally ill.  Patient has had abdominal discomfort for past couple of months. 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 Has had 1 diagnostic paracentsis and one pallaitive paracentesis thus far    PT Comments    Patient received in bed with family present, who assisted in translation today, pleasant and willing to participate with skilled PT services. She does require MinA for functional bed mobility today, however is able to complete functional transfers and gait with RW with S to Min guard this afternoon. Gait distance limited by fatigue, note O2 remained at 93% however HR increased to 132-135BPM with gait approximately 143f at self selected pace (approximately 85% of age predicted HR max), also limiting extent of PT intervention today. She was left up in the chair with all needs met, family present this afternoon.     Follow Up Recommendations  Supervision/Assistance - 24 hour;No PT follow up     Equipment Recommendations  Rolling walker with 5" wheels    Recommendations for Other Services       Precautions / Restrictions Precautions Precautions: Fall Precaution Comments: watch O2 and HR  Restrictions Weight Bearing Restrictions: No    Mobility  Bed Mobility   Bed Mobility: Supine to Sit     Supine to sit: Min assist     General bed mobility comments: MinA to bring legs over the side of the bed   Transfers Overall transfer level: Needs assistance Equipment used: Rolling walker (2 wheeled) Transfers: Sit to/from Stand Sit to Stand: Supervision          General transfer comment: cues for safety, line management   Ambulation/Gait Ambulation/Gait assistance: Min guard Ambulation Distance (Feet): 150 Feet Assistive device: Rolling walker (2 wheeled) Gait Pattern/deviations: Decreased step length - left;Decreased step length - right;Decreased stride length     General Gait Details: gait pattern and use of RW improving, however HR reaching 132-135BPM with slow, self-selected pace of gait (85% of age predicted HR max); gait limited by HR limitations and fatigue today    Stairs            Wheelchair Mobility    Modified Rankin (Stroke Patients Only)       Balance Overall balance assessment: Needs assistance Sitting-balance support: No upper extremity supported;Feet supported Sitting balance-Leahy Scale: Good     Standing balance support: No upper extremity supported;During functional activity Standing balance-Leahy Scale: Fair                              Cognition Arousal/Alertness: Awake/alert Behavior During Therapy: WFL for tasks assessed/performed Overall Cognitive Status: Difficult to assess                                 General Comments: appears WFL. family present to translate      Exercises      General Comments General comments (skin integrity, edema, etc.): family present to translate       Pertinent Vitals/Pain Pain Assessment: 0-10 Pain Score: 5  Pain Location: abdomen Pain Descriptors /  Indicators: Aching;Guarding Pain Intervention(s): Limited activity within patient's tolerance;Repositioned;Monitored during session    Home Living                      Prior Function            PT Goals (current goals can now be found in the care plan section) Acute Rehab PT Goals Patient Stated Goal: dtr (to take pt home) PT Goal Formulation: With family Time For Goal Achievement: 03/10/17 Potential to Achieve Goals: Fair Progress towards PT goals: Progressing toward  goals    Frequency    Min 3X/week      PT Plan Current plan remains appropriate    Co-evaluation              AM-PAC PT "6 Clicks" Daily Activity  Outcome Measure  Difficulty turning over in bed (including adjusting bedclothes, sheets and blankets)?: Unable Difficulty moving from lying on back to sitting on the side of the bed? : Unable Difficulty sitting down on and standing up from a chair with arms (e.g., wheelchair, bedside commode, etc,.)?: Unable Help needed moving to and from a bed to chair (including a wheelchair)?: A Little Help needed walking in hospital room?: A Little Help needed climbing 3-5 steps with a railing? : A Little 6 Click Score: 12    End of Session Equipment Utilized During Treatment: Oxygen Activity Tolerance: Patient tolerated treatment well Patient left: in chair;with call bell/phone within reach;with family/visitor present   PT Visit Diagnosis: Muscle weakness (generalized) (M62.81);Difficulty in walking, not elsewhere classified (R26.2);Pain     Time: 7106-2694 PT Time Calculation (min) (ACUTE ONLY): 18 min  Charges:  $Gait Training: 8-22 mins                    G Codes:       Deniece Ree PT, DPT, CBIS  Supplemental Physical Therapist [place]   Pager [phone]

## 2017-03-06 NOTE — Progress Notes (Signed)
Daily Progress Note   Patient Name: Jessica Gregory       Date: 03/06/2017 DOB: [birth year]  Age: 67 y.o. MRN#: [id number] Attending Physician: Ardath Sax, MD Primary Care Physician: Forrest Moron, MD Admit Date: 01/29/2017  Reason for Consultation/Follow-up: Establishing goals of care, Pain control and Psychosocial/spiritual support  Subjective:  patient is resting in bed, daughter and the patient's friends are at the bedside, she ambulated in the halls on 2-6, has had reasonable pain control her daughter. Med history noted. Apparently, also taking in a few bites, has had some slight improvement in her appetite.    See below.   Length of Stay: 20  Current Medications: Scheduled Meds:  . Chlorhexidine Gluconate Cloth  6 each Topical Q0600  . enoxaparin (LOVENOX) injection  40 mg Subcutaneous Q24H  . feeding supplement  1 Container Oral TID BM  . furosemide  20 mg Intravenous Once  . furosemide  20 mg Oral Daily  . insulin aspart  0-9 Units Subcutaneous TID WC  . multivitamin  15 mL Oral Daily  . pantoprazole (PROTONIX) IV  40 mg Intravenous Daily  . protein supplement  8 oz Oral BID  . psyllium  1 packet Oral Daily  . senna-docusate  1 tablet Oral BID  . simethicone  40 mg Oral QID  . sodium chloride flush  10-40 mL Intracatheter Q12H    Continuous Infusions: . potassium PHOSPHATE IVPB (mEq)     PPS 30%  PRN Meds: HYDROmorphone, levalbuterol, LORazepam, morphine injection, polyethylene glycol, prochlorperazine, sodium chloride flush, sodium chloride flush, sodium chloride flush  Physical Exam         Awake  Resting comfortably in bed S1 S2 Abdomen less distended Lungs diminished bases No edema Muscle wasting evident   Vital Signs: BP (!) 144/87 (BP Location: Right  Arm)   Pulse 100   Temp 98 F (36.7 C) (Oral)   Resp 14   Ht 4' 10.5" (1.486 m)   Wt 56.3 kg (124 lb 1.9 oz)   SpO2 100%   BMI 25.50 kg/m  SpO2: SpO2: 100 % O2 Device: O2 Device: Nasal Cannula O2 Flow Rate: O2 Flow Rate (L/min): 3 L/min  Intake/output summary:   Intake/Output Summary (Last 24 hours) at 03/06/2017 1047 Last data filed at 03/06/2017 0300 Gross per 24 hour  Intake 420 ml  Output -  Net 420 ml   LBM: Last BM Date: 03/05/17 Baseline Weight: Weight: 55.2 kg (121 lb 11.1 oz) Most recent weight: Weight: 56.3 kg (124 lb 1.9 oz)       Palliative Assessment/Data: PPS 30%     Patient Active Problem List   Diagnosis Date Noted  . Other partial intestinal obstruction ([place])   . Constipation 03/03/2017  . Fever   . Abdominal distention   . Acute delirium   . Hyponatremia   . Acute respiratory failure ([place]) 02/26/2017  . Electrolyte imbalance 02/26/2017  . Hypokalemia 02/25/2017  . 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 Ascites   . Cancer associated pain 02/21/2017  . Nausea   . Admission for chemotherapy   . Peritoneal carcinomatosis ([place])   . Metastatic cancer ([place])   . Sepsis ([place]) 02/14/2017  . Diffuse papular rash 08/01/2016  . Psoriasis 08/01/2016  . Chronic hepatitis B ([place]) 08/01/2016  . Hepatitis, viral 03/01/2015  . Controlled type 2 diabetes mellitus without complication, without long-term current use of insulin ([place]) 03/01/2015  . Uncontrolled diabetes mellitus ([place]) 11/21/2014  . Language barrier 11/16/2014    Palliative Care Assessment & Plan   Patient Profile:   Assessment: SBO Sepsis/delirium multi factorial. pulm mets  Possible GB malignancy or Possible ovarian malignancy Recurrent malignant ascites.  Uncontrolled multi factorial pain.  Cancer cachexia Cancer anorexia Severe calorie/protein malnutrition  Recommendations/Plan:  PT eval and consult requested as per daughter's request.   Continue current pain  medication  Continue to encourage PO  Remains full code.  No additional PMT recommendations at this time.    We will continue to follow along.   Code Status:    Code Status Orders  (From admission, onward)        Start     Ordered   02/27/17 1315  Full code  Continuous     02/27/17 1314    Code Status History    Date Active Date Inactive Code Status Order ID Comments User Context   02/26/2017 08:05 02/27/2017 13:14 DNR [id number]  Ardath Sax, MD Inpatient   02/14/2017 00:35 02/26/2017 08:05 Full Code [id number]  Damita Lack, MD ED       Prognosis:   guarded   Discharge Planning:  To Be Determined  Care plan was discussed with patient,daughter.   Thank you for allowing the Palliative Medicine Team to assist in the care of this patient.   Time In: 10 Time Out: 10.25 Total Time 25 min  Prolonged Time Billed  no       Greater than 50%  of this time was spent counseling and coordinating care related to the above assessment and plan.  Loistine Chance, MD [phone]  Please contact Palliative Medicine Team phone at [phone] for questions and concerns.

## 2017-03-06 NOTE — Progress Notes (Signed)
IP PROGRESS NOTE  Subjective:  Patient appears to have had a better, more restful night.  Was able to walk in the hall yesterday, able to tolerate small amounts of liquid/soft food.  Reported/recorded pain 3-6/10.  Continues to have loose bowel movements.  Objective: Vital signs in last 24 hours: Blood pressure (!) 144/87, pulse 100, temperature 98 F (36.7 C), temperature source Oral, resp. rate 14, height 4' 10.5" (1.486 m), weight 124 lb 1.9 oz (56.3 kg), SpO2 100 %.  Intake/Output from previous day: 02/06 0701 - 02/07 0700 In: 21 [P.O.:120; IV Piggyback:300] Out: -   Physical Exam: Sitting up in the chair.  Respirations appear to be comfortable, no outward signs of discomfort at this time.Marland Kitchen HEENT: Anicteric sclera, moist mucous membranes Lungs: Decreased breath sounds bilaterally with expiratory wheezing and frequent nonproductive cough Cardiac: S1/S2, regular, no murmurs Abdomen: Stable abdominal distention with diffuse tenderness  Lymph nodes: No palpable lymphadenopathy in the cervical, supraclavicular, axillary, or inguinal regions Neurologic: Unable to evaluate today Skin: No rash, petechiae, or ecchymosis. Musculoskeletal: Bilateral lower extremity swelling.   Lab Results: Recent Labs    03/05/17 0500 03/06/17 0500  WBC 16.2* 15.2*  HGB 10.4* 10.1*  HCT 33.7* 32.7*  PLT 185 196    BMET Recent Labs    03/05/17 0500 03/06/17 0500  NA 134* 136  K 2.8* 3.4*  CL 87* 91*  CO2 31 34*  GLUCOSE 126* 119*  BUN 10 9  CREATININE 0.50 0.43*  CALCIUM 7.5* 8.1*    Lab Results  Component Value Date   CEA1 0.9 02/19/2017    Studies/Results: No results found.  Medications: I have reviewed the patient's current medications.  Assessment/Plan: 67 year old female who presented with abdominal pain and partial bowel obstruction with findings of widely metastatic malignancy including peritoneal carcinomatosis, hepatic lesions, and multifocal bilateral pulmonary  involvement.  Peritoneal fluid positive for presence of cells consistent with adenocarcinoma.    **SBO, partial/Ileus due to opioids: Recent CT of the abdomen pelvis demonstrates persistent small bowel distention consistent with at least partial obstruction.  In addition to that,: Appears distended with retention of stool which is likely due to opioid medications. --Unfortunately, cannot use methylnaltrexone due to presence of bowel obstruction. -Continue gentle laxatives and reduce opioid doses   --Senna/Docusate + Psyllum  --Miralax PRN  **Sepsis/Delirium: Unclear if combination of severe pain, opioid medications, and possible infection combined to contribute to the presentation last night.  CT of the abdomen/pelvis demonstrates no evidence of free air in the peritoneum.  Essentially stable appearance of the known malignancy.  A possible foreign body is noted in the urethra.  Differential for that includes a nephrolith versus portion of the pessary.  No recurrent delirium so far.  Patient remains afebrile. --Will limit opioid medications to short-acting forms only apparently does not tolerate MS Contin that has been tried several times  **Respiratory failure mixed hypoxemic/hypercarbic: Likely due to combination of malignant infiltration of the lungs including lung infiltration by underlying malignancy including lymphangitic spread, pleural effusion secondary to the malignant ascites, and effects of significant levels of opioid medications delivered for pain control.  Seen and evaluated by pulmonology who is recommendations and input are greatly appreciated.  Bicarbonate level down to 36 today, likely due to diuresis with furosemide.  Echocardiogram was obtained 02/27/17 demonstrating preserved left ventricular ejection fraction.  Weight is stable over the past several days, I do not think that the fluid retention is the current problem although patient's lower extremity swelling has  increased since  yesterday.  Weight is stable since yesterday.  Patient received 50 g of albumin with some improvement in the diuresis. --Continue daily weights -- I/O not accurate due to patient using bathroom on her own and not collecting the volume --Continue furosemide 20 mg daily due to weight gain likely representing swelling lower extremities.  --1 dose of IV furosemide in the afternoon to increase diuresis.  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  Nevertheless, distribution of the disease as well as elevation of CA 125 and normal CA19-9 with noncontributory immunohistochemical profile favor primary peritoneal or ovarian etiology. Received the initial chemotherapy with carboplatin AUC 5 + paclitaxel 125 mg/m Q21d on 02/21/17.  Repeat CT scan of the abdomen and pelvis demonstrates essentially stable findings.  Radiographic appearance is still very worrisome for possible primary gallbladder carcinoma based on the thickened gallbladder and appearance of direct hepatic invasion.  At this time, patient has received the first dose of systemic chemotherapy and no additional treatment will be administered until 03/14/17.  My hope is that the patient is discharged from hospital by that time to follow-up as outpatient. --Daily labs --Too early to assess treatment response at this time  **Recurrent malignant ascites: Due to underlying malignancy.  Will reaccumulate rapidly until the cancer is addressed. Paracentesis 02/21/17, 02/24/17.  Persistent significant pain in the abdomen with increased bowel output.  Increasing pain is more likely to be associated with increased bowel mobility. --We will abstain from additional paracentesis at this point in time unless abdominal distention increases significantly.  **Cancer-associated pain: Patient seems to be able  to stay awake enough at this current medication levels with no uncontrolled pain.  So far, pain control has been better with use of oral hydromorphone with IV morphine for breakthroughs. MAR shows only 2 doses of IV morphine given over the past 24 hours.  Patient has received 2 doses of oral hydromorphone as well. --Current pain regimen:     --MSIR 4mg  IV Q1hrs PRN Pain =>5   --Hydromorphone 2 mg oral every 3 hours as needed --I will decrease frequency of IV morphine to focus on oral hydromorphone as the main pain control method.  **Diabetes mellitus: As expected, glucose is higher and now that patient has received glucocorticoids with her chemotherapy. --Continue SSI for now, may have to increase to higher grade scale  **Malnutrition, combined calorie/protein, severe: Severe decrease in oral intake due to peritoneal carcinomatosis and partial bowel obstruction with pain exacerbated by food intake.  Patient has been seen by dietitian.  Phosphorus level has stabilized, albumin is slightly improved compared to the prior days. --Resume diet and advance as tolerated  **Electrolytes/Fluids:  Hyponatremia: Complex hyponatremia due to intravascular volume depletion, pain, nausea, metastatic disease in the liver.  Sodium has normalized  Hypokalemia: Potassium has dropped likely due to increased urine output and insufficient oral intake.  Replaced with IV KCl/KPhos, resolved without recurrence.  Persistent hypokalemia as well as hypophosphatemia. --We will replace with potassium phosphate today   **PPx:  --Continue pantoprazole for stress ulcer prophylaxis  --Start enoxaparin 40 mg subcu daily for DVT prophylaxis  **Code Status:  --Full code: Previously, patient was DNR/DNI, but we had additional conversation yesterday and patient insists on revival to be attempted in case of cardiac or respiratory arrest.  Have explained that her likelihood of successful revival is negligible, likely less than 1%  based on  her extensive malignancy.  CODE STATUS reviewed again today outlining the poor prognosis in case of need for resuscitation.  Family continues to maintain for the patient to remain full code.  **Disposition:  --At this time, I am becoming increasingly concerned that patient course is not going to turn around.  Previously, patient and her family were not ready to accept the potential negative outcomes of this hospitalization.  But it is becoming more evident that I will treatment is unlikely to result in significant symptomatic improvement for the patient.  Our discussion with patient's daughter today outlined the plan to await and continue support until Friday.  Will conduct additional family meeting on Friday to decide whether we will continue with cancer directed therapy or switch to hospice.    LOS: 20 days   Ardath Sax, MD   03/06/2017, 8:07 AM

## 2017-03-07 LAB — COMPREHENSIVE METABOLIC PANEL
ALT: 31 U/L (ref 14–54)
AST: 24 U/L (ref 15–41)
Albumin: 2.5 g/dL — ABNORMAL LOW (ref 3.5–5.0)
Alkaline Phosphatase: 80 U/L (ref 38–126)
Anion gap: 11 (ref 5–15)
BILIRUBIN TOTAL: 0.6 mg/dL (ref 0.3–1.2)
BUN: 13 mg/dL (ref 6–20)
CO2: 34 mmol/L — ABNORMAL HIGH (ref 22–32)
CREATININE: 0.49 mg/dL (ref 0.44–1.00)
Calcium: 7.7 mg/dL — ABNORMAL LOW (ref 8.9–10.3)
Chloride: 92 mmol/L — ABNORMAL LOW (ref 101–111)
Glucose, Bld: 138 mg/dL — ABNORMAL HIGH (ref 65–99)
POTASSIUM: 3.7 mmol/L (ref 3.5–5.1)
Sodium: 137 mmol/L (ref 135–145)
TOTAL PROTEIN: 5.5 g/dL — AB (ref 6.5–8.1)

## 2017-03-07 LAB — CBC WITH DIFFERENTIAL/PLATELET
BASOS ABS: 0 10*3/uL (ref 0.0–0.1)
Basophils Relative: 0 %
EOS PCT: 0 %
Eosinophils Absolute: 0 10*3/uL (ref 0.0–0.7)
HEMATOCRIT: 33.7 % — AB (ref 36.0–46.0)
Hemoglobin: 10.6 g/dL — ABNORMAL LOW (ref 12.0–15.0)
LYMPHS ABS: 0.9 10*3/uL (ref 0.7–4.0)
LYMPHS PCT: 6 %
MCH: 25.4 pg — AB (ref 26.0–34.0)
MCHC: 31.5 g/dL (ref 30.0–36.0)
MCV: 80.6 fL (ref 78.0–100.0)
MONO ABS: 1.6 10*3/uL — AB (ref 0.1–1.0)
MONOS PCT: 10 %
NEUTROS ABS: 14 10*3/uL — AB (ref 1.7–7.7)
Neutrophils Relative %: 84 %
Platelets: 182 10*3/uL (ref 150–400)
RBC: 4.18 MIL/uL (ref 3.87–5.11)
RDW: 14.6 % (ref 11.5–15.5)
WBC: 16.5 10*3/uL — ABNORMAL HIGH (ref 4.0–10.5)

## 2017-03-07 LAB — MAGNESIUM: MAGNESIUM: 1.8 mg/dL (ref 1.7–2.4)

## 2017-03-07 LAB — CULTURE, BLOOD (ROUTINE X 2)
CULTURE: NO GROWTH
Culture: NO GROWTH
Special Requests: ADEQUATE
Special Requests: ADEQUATE

## 2017-03-07 LAB — GLUCOSE, CAPILLARY
GLUCOSE-CAPILLARY: 118 mg/dL — AB (ref 65–99)
Glucose-Capillary: 141 mg/dL — ABNORMAL HIGH (ref 65–99)
Glucose-Capillary: 114 mg/dL — ABNORMAL HIGH (ref 65–99)
Glucose-Capillary: 115 mg/dL — ABNORMAL HIGH (ref 65–99)

## 2017-03-07 LAB — PHOSPHORUS: PHOSPHORUS: 2.4 mg/dL — AB (ref 2.5–4.6)

## 2017-03-07 MED ORDER — FUROSEMIDE 10 MG/ML IJ SOLN
20.0000 mg | Freq: Once | INTRAMUSCULAR | Status: AC
  Administered 2017-03-07: 20 mg via INTRAVENOUS
  Filled 2017-03-07: qty 2

## 2017-03-07 MED ORDER — FUROSEMIDE 40 MG PO TABS
40.0000 mg | ORAL_TABLET | Freq: Every day | ORAL | Status: DC
  Administered 2017-03-07 – 2017-03-13 (×7): 40 mg via ORAL
  Filled 2017-03-07 (×7): qty 1

## 2017-03-07 NOTE — Progress Notes (Addendum)
Nutrition Follow-up  INTERVENTION:   -D/c Boost Breeze -pt refusing -Continue Unjury Chicken Soup BID, each 8 ounce serving provides 100 kcal and 21 grams of protein.  Addendum: Ordered Magic Cups in between all meals. Each provides 290 kcal and 9g protein.   NUTRITION DIAGNOSIS:   Inadequate oral intake related to poor appetite, early satiety as evidenced by per patient/family report.  Ongoing.  GOAL:   Patient will meet greater than or equal to 90% of their needs  Progressing.  MONITOR:   PO intake, Supplement acceptance, Weight trends, Labs, I & O's  ASSESSMENT:   Pt with PMH significant for DM and HLD. Was recently found to have abnormal CT reading concerning for widespread metastatic disease. Admitted for newly diagnosed intra-abdominal and pulmonary metastases.   Significant Events: 1/18- paracentesis drained 2.5 L  1/25- paracentesis drained 1.85 L/Cycle 1 Taxol/carboplatin 1/28- paracentesis drained 0.92   Pt on full liquid diet now. Continues to have poor appetite and eating mainly just bites of her meal trays. Pt not drinking Boost Breeze supplements and drinking only 1 Unjury Chicken Soup per day. Per chart, Oncology to speak with family further about goals of care. Will monitor for plan.  Pt's weight continues to fluctuate d/t fluid. Currently the same weight as on 2/5.  Medications: IV Lasix once, Lasix tablet daily, Liquid MVI daily, Metamucil packet daily, Senokot-D tablet BID, IV Compazine PRN  Labs reviewed: Low Phos Mg WNL  Diet Order:  Diet full liquid Room service appropriate? Yes; Fluid consistency: Thin  EDUCATION NEEDS:   Education needs have been addressed  Skin:  Skin Assessment: Reviewed RN Assessment  Last BM:  2-3  Height:   Ht Readings from Last 1 Encounters:  02/23/17 4' 10.5" (1.486 m)    Weight:   Wt Readings from Last 1 Encounters:  03/06/17 124 lb 1.9 oz (56.3 kg)    Ideal Body Weight:  44.3 kg  BMI:  Body mass  index is 25.5 kg/m.  Estimated Nutritional Needs:   Kcal:  1790-1950 (33-36 kcal/kg)  Protein:  80-92 grams (1.5-1.7 grams/kg)  Fluid:  >/= 1.8 L/day  Clayton Bibles, MS, RD, LDN [place] Dietitian Pager: [phone] After Hours Pager: [phone]

## 2017-03-07 NOTE — Progress Notes (Signed)
OT Cancellation Note  Patient Details Name: Jessica Gregory MRN: [id number] DOB: November 21, 1950   Cancelled Treatment:    Reason Eval/Treat Not Completed: Fatigue/lethargy limiting ability to participate  Pt sleeping soundly.  Pts family member present. Unable to wake pt.  Rn aware and reported pt just had pain meds  Kari Baars, [place]  Betsy Pries 03/07/2017, 11:30 AM

## 2017-03-07 NOTE — Progress Notes (Signed)
IP PROGRESS NOTE  Subjective:  Patient had more activity yesterday.  Continues to have pain in the abdomen and back, but not as intense as the previous days.  Did not sleep well overnight, reasons are unclear.  Breathing has improved after additional diuresis.  Objective: Vital signs in last 24 hours: Blood pressure (!) 145/92, pulse (!) 126, temperature 98.4 F (36.9 C), temperature source Oral, resp. rate 20, height 4' 10.5" (1.486 m), weight 124 lb 1.9 oz (56.3 kg), SpO2 98 %.  Intake/Output from previous day: 02/07 0701 - 02/08 0700 In: -  Out: 100 [Urine:100]  Physical Exam: Sitting up in the chair.  Respirations appear to be comfortable, no outward signs of discomfort at this time.Marland Kitchen HEENT: Anicteric sclera, moist mucous membranes Lungs: Decreased breath sounds bilaterally with expiratory wheezing and frequent nonproductive cough Cardiac: S1/S2, regular, no murmurs Abdomen: Stable abdominal distention with diffuse tenderness  Lymph nodes: No palpable lymphadenopathy in the cervical, supraclavicular, axillary, or inguinal regions Neurologic: Unable to evaluate today Skin: No rash, petechiae, or ecchymosis. Musculoskeletal: Bilateral lower extremity swelling.   Lab Results: Recent Labs    03/06/17 0500 03/07/17 0700  WBC 15.2* 16.5*  HGB 10.1* 10.6*  HCT 32.7* 33.7*  PLT 196 182    BMET Recent Labs    03/06/17 0500 03/07/17 0700  NA 136 137  K 3.4* 3.7  CL 91* 92*  CO2 34* 34*  GLUCOSE 119* 138*  BUN 9 13  CREATININE 0.43* 0.49  CALCIUM 8.1* 7.7*    Lab Results  Component Value Date   CEA1 0.9 02/19/2017    Studies/Results: No results found.  Medications: I have reviewed the patient's current medications.  Assessment/Plan: 67 year old female who presented with abdominal pain and partial bowel obstruction with findings of widely metastatic malignancy including peritoneal carcinomatosis, hepatic lesions, and multifocal bilateral pulmonary involvement.   Peritoneal fluid positive for presence of cells consistent with adenocarcinoma.    **SBO, partial/Ileus due to opioids: Recent CT of the abdomen pelvis demonstrates persistent small bowel distention consistent with at least partial obstruction.  In addition to that,: Appears distended with retention of stool which is likely due to opioid medications. --Unfortunately, cannot use methylnaltrexone due to presence of bowel obstruction. -Continue gentle laxatives and reduce opioid doses   --Senna/Docusate + Psyllum  --Miralax PRN  **Sepsis/Delirium: Unclear if combination of severe pain, opioid medications, and possible infection combined to contribute to the presentation last night.  CT of the abdomen/pelvis demonstrates no evidence of free air in the peritoneum.  Essentially stable appearance of the known malignancy.  A possible foreign body is noted in the urethra.  Differential for that includes a nephrolith versus portion of the pessary.  No recurrent delirium so far.  Patient remains afebrile. --Will limit opioid medications to short-acting forms only apparently does not tolerate MS Contin that has been tried several times  **Respiratory failure mixed hypoxemic/hypercarbic: Likely due to combination of malignant infiltration of the lungs including lung infiltration by underlying malignancy including lymphangitic spread, pleural effusion secondary to the malignant ascites, and effects of significant levels of opioid medications delivered for pain control.  Seen and evaluated by pulmonology who is recommendations and input are greatly appreciated.  Bicarbonate level down to 36 today, likely due to diuresis with furosemide.  Echocardiogram was obtained 02/27/17 demonstrating preserved left ventricular ejection fraction.  Weight is stable over the past several days, I do not think that the fluid retention is the current problem although patient's lower extremity swelling has  increased since yesterday.   Weight is stable since yesterday.  Patient received 50 g of albumin with some improvement in the diuresis. --Continue daily weights -- I/O not accurate due to patient using bathroom on her own and not collecting the volume --Increase furosemide to 40 mg daily due to weight gain likely representing swelling lower extremities.  --1 dose of IV furosemide in the afternoon to increase diuresis.  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  Nevertheless, distribution of the disease as well as elevation of CA 125 and normal CA19-9 with noncontributory immunohistochemical profile favor primary peritoneal or ovarian etiology. Received the initial chemotherapy with carboplatin AUC 5 + paclitaxel 125 mg/m Q21d on 02/21/17.  Repeat CT scan of the abdomen and pelvis demonstrates essentially stable findings.  Radiographic appearance is still very worrisome for possible primary gallbladder carcinoma based on the thickened gallbladder and appearance of direct hepatic invasion.  At this time, patient has received the first dose of systemic chemotherapy and no additional treatment will be administered until 03/14/17.  My hope is that the patient is discharged from hospital by that time to follow-up as outpatient. --Daily labs --Too early to assess treatment response at this time  **Recurrent malignant ascites: Due to underlying malignancy.  Will reaccumulate rapidly until the cancer is addressed. Paracentesis 02/21/17, 02/24/17.  Persistent significant pain in the abdomen with increased bowel output.  Increasing pain is more likely to be associated with increased bowel mobility. --We will abstain from additional paracentesis at this point in time unless abdominal distention increases significantly.  **Cancer-associated pain: Patient seems to be able to stay  awake enough at this current medication levels with no uncontrolled pain.  Patient needed for doses of oral Dilaudid for reasonable pain control.  Morphine was not required. --Current pain regimen:     --MSIR 4mg  IV Q1hrs PRN Pain =>5       --Hydromorphone 2 mg oral every 3 hours as needed --Discontinue IV morphine  **Diabetes mellitus: As expected, glucose is higher and now that patient has received glucocorticoids with her chemotherapy. --Continue SSI for now, may have to increase to higher grade scale  **Malnutrition, combined calorie/protein, severe: Severe decrease in oral intake due to peritoneal carcinomatosis and partial bowel obstruction with pain exacerbated by food intake.  Patient has been seen by dietitian.  Phosphorus level has stabilized, albumin is slightly improved compared to the prior days. --Resume diet and advance as tolerated  **Electrolytes/Fluids:  Hyponatremia: Complex hyponatremia due to intravascular volume depletion, pain, nausea, metastatic disease in the liver.  Sodium has normalized  Hypokalemia: Potassium has dropped likely due to increased urine output and insufficient oral intake.  Replaced with IV KCl/KPhos, resolved without recurrence.  Persistent hypokalemia as well as hypophosphatemia. --Increase furosemide to 40 mg daily by mouth -Additional dose of furosemide 20 mg IV times 1 in the afternoon.   **PPx:  --Continue pantoprazole for stress ulcer prophylaxis  --Start enoxaparin 40 mg subcu daily for DVT prophylaxis  **Code Status:  --Full code: Previously, patient was DNR/DNI, but we had additional conversation yesterday and patient insists on revival to be attempted in case of cardiac or respiratory arrest.  Have explained that her likelihood of successful revival is negligible, likely less than 1% based on her extensive malignancy.  CODE STATUS reviewed again today outlining the poor prognosis in case of need for resuscitation.  Family continues to  maintain for the patient to remain full code.  **Disposition:  --At this time, I am becoming increasingly concerned that patient course is not going to turn around.  Previously, patient and her family were not ready to accept the potential negative outcomes of this hospitalization.  But it is becoming more evident that I will treatment is unlikely to result in significant symptomatic improvement for the patient.  Our discussion with patient's daughter today outlined the plan to await and continue support until Friday.  Will conduct additional family meeting on Friday to decide whether we will continue with cancer directed therapy or switch to hospice.    LOS: 21 days   Ardath Sax, MD   03/07/2017, 8:16 AM

## 2017-03-07 NOTE — Progress Notes (Signed)
Daily Progress Note   Patient Name: Jessica Gregory       Date: 03/07/2017 DOB: [birth year]  Age: 67 y.o. MRN#: [id number] Attending Physician: Ardath Sax, MD Primary Care Physician: Forrest Moron, MD Admit Date: 02/19/2017  Reason for Consultation/Follow-up: Establishing goals of care, Pain control and Psychosocial/spiritual support  Subjective:  patient alternates between bed and chair, she is ambulating more, per daughter, appetite remains poor and oral intake remains poor, see below    Length of Stay: 21  Current Medications: Scheduled Meds:  . Chlorhexidine Gluconate Cloth  6 each Topical Q0600  . enoxaparin (LOVENOX) injection  40 mg Subcutaneous Q24H  . furosemide  40 mg Oral Daily  . insulin aspart  0-9 Units Subcutaneous TID WC  . multivitamin  15 mL Oral Daily  . pantoprazole (PROTONIX) IV  40 mg Intravenous Daily  . protein supplement  8 oz Oral BID  . psyllium  1 packet Oral Daily  . senna-docusate  1 tablet Oral BID  . simethicone  40 mg Oral QID  . sodium chloride flush  10-40 mL Intracatheter Q12H    Continuous Infusions:  PPS 30%  PRN Meds: guaiFENesin-dextromethorphan, HYDROmorphone, levalbuterol, LORazepam, polyethylene glycol, prochlorperazine, sodium chloride flush, sodium chloride flush, sodium chloride flush  Physical Exam         Awake  Resting comfortably in bed S1 S2 Abdomen less distended Lungs diminished bases No edema Muscle wasting evident   Vital Signs: BP (!) 145/92 (BP Location: Right Arm)   Pulse (!) 126   Temp 98.4 F (36.9 C) (Oral)   Resp 20   Ht 4' 10.5" (1.486 m)   Wt 56.3 kg (124 lb 1.9 oz)   SpO2 98%   BMI 25.50 kg/m  SpO2: SpO2: 98 % O2 Device: O2 Device: Nasal Cannula O2 Flow Rate: O2 Flow Rate (L/min): 3  L/min  Intake/output summary:   Intake/Output Summary (Last 24 hours) at 03/07/2017 1432 Last data filed at 03/07/2017 0800 Gross per 24 hour  Intake -  Output 100 ml  Net -100 ml   LBM: Last BM Date: 03/06/17 Baseline Weight: Weight: 55.2 kg (121 lb 11.1 oz) Most recent weight: Weight: 56.3 kg (124 lb 1.9 oz)       Palliative Assessment/Data: PPS 30%     Patient Active Problem List  Diagnosis Date Noted  . Other partial intestinal obstruction ([place])   . Constipation 03/03/2017  . Fever   . Abdominal distention   . Acute delirium   . Hyponatremia   . Acute respiratory failure ([place]) 02/26/2017  . Electrolyte imbalance 02/26/2017  . Hypokalemia 02/25/2017  . Intra-abdominal malignant neoplasm ([place])   . Ascites   . SOB (shortness of breath)   . Cancer associated pain 02/21/2017  . Nausea   . Admission for chemotherapy   . Peritoneal carcinomatosis ([place])   . Metastatic cancer ([place])   . Sepsis ([place]) 02/14/2017  . Diffuse papular rash 08/01/2016  . Psoriasis 08/01/2016  . Chronic hepatitis B ([place]) 08/01/2016  . Hepatitis, viral 03/01/2015  . Controlled type 2 diabetes mellitus without complication, without long-term current use of insulin ([place]) 03/01/2015  . Uncontrolled diabetes mellitus ([place]) 11/21/2014  . Language barrier 11/16/2014    Palliative Care Assessment & Plan   Patient Profile:   Assessment: SBO Sepsis/delirium multi factorial. pulm mets  Possible GB malignancy or Possible ovarian malignancy Recurrent malignant ascites.  Uncontrolled multi factorial pain.  Cancer cachexia Cancer anorexia Severe calorie/protein malnutrition  Recommendations/Plan:  PT OT eval and recommendations noted. The patient needs 24 hour supervision.     Continue current pain medication, I have monitored med history.   Continue to encourage PO  Remains full code.   Dietitian consult, magic cup and other supplements as appropriate.   No additional PMT  recommendations at this time.    We will continue to follow along.   Code Status:    Code Status Orders  (From admission, onward)        Start     Ordered   02/27/17 1315  Full code  Continuous     02/27/17 1314    Code Status History    Date Active Date Inactive Code Status Order ID Comments User Context   02/26/2017 08:05 02/27/2017 13:14 DNR [id number]  Ardath Sax, MD Inpatient   02/14/2017 00:35 02/26/2017 08:05 Full Code [id number]  Damita Lack, MD ED       Prognosis:   guarded   Discharge Planning:  To Be Determined PT OT notes reviewed.  Care plan was discussed with patient,daughter.   Thank you for allowing the Palliative Medicine Team to assist in the care of this patient.   Time In: 1400 Time Out: 1425 Total Time 25 min  Prolonged Time Billed  no       Greater than 50%  of this time was spent counseling and coordinating care related to the above assessment and plan.  Loistine Chance, MD [phone]  Please contact Palliative Medicine Team phone at [phone] for questions and concerns.

## 2017-03-07 NOTE — Evaluation (Signed)
Occupational Therapy Evaluation Patient Details Name: Jessica Gregory MRN: [id number] DOB: 30-Oct-1950 Today's Date: 03/07/2017    History of Present Illness 67 y.o. female with medical history significant of history of diabetes mellitus type 2, hyperlipidemia was sent to the ER for further evaluation due to abnormal CT scan and and patient feeling generally ill.  Patient has had abdominal discomfort for past couple of months. 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 Has had 1 diagnostic paracentsis and one pallaitive paracentesis thus far   Clinical Impression   Pt admitted with above. She demonstrates the below listed deficits and will benefit from continued OT to maximize safety and independence with BADLs.  Pt presents to OT with generalized weakness, decreased activity tolerance, decreased balance, and pain.  She currently requires min - mod A for ADLs, and min guard - min A for functional mobility - requires increased level of assist as she fatigues.  PTA, pt lived with spouse, daughter and daughter's family, and was fully independent including working full time.  Family is very supportive with hopes that she can return home.  Will follow acutely to maximize safety and independence with ADLs.   Current recommendation is for HHOT at discharge.          Follow Up Recommendations  Home health OT;Supervision/Assistance - 24 hour    Equipment Recommendations  3 in 1 bedside commode    Recommendations for Other Services       Precautions / Restrictions Precautions Precautions: Fall Precaution Comments: watch O2 and HR       Mobility Bed Mobility               General bed mobility comments: Pt sitting in recliner   Transfers Overall transfer level: Needs assistance Equipment used: Rolling walker (2 wheeled) Transfers: Sit to/from [facility] Sit to Stand: Min guard Stand pivot  transfers: Min guard       General transfer comment: mildly unsteady     Balance Overall balance assessment: Needs assistance Sitting-balance support: No upper extremity supported;Feet supported Sitting balance-Leahy Scale: Good     Standing balance support: No upper extremity supported Standing balance-Leahy Scale: Fair                             ADL either performed or assessed with clinical judgement   ADL Overall ADL's : Needs assistance/impaired Eating/Feeding: Set up   Grooming: Wash/dry hands;Wash/dry face;Oral care;Brushing hair;Minimal assistance;Standing   Upper Body Bathing: Minimal assistance;Sitting   Lower Body Bathing: Moderate assistance;Sit to/from stand   Upper Body Dressing : Minimal assistance;Sitting   Lower Body Dressing: Moderate assistance;Sit to/from stand   Toilet Transfer: Min guard;Ambulation;Comfort height toilet;RW   Toileting- Clothing Manipulation and Hygiene: Minimal assistance;Sit to/from stand       Functional mobility during ADLs: Min guard;Minimal assistance;Rolling walker General ADL Comments: Pt requires assist due to pain and decreased activity tolerance      Vision Patient Visual Report: No change from baseline       Perception     Praxis      Pertinent Vitals/Pain Pain Assessment: Faces Faces Pain Scale: Hurts even more Pain Location: abdomen Pain Descriptors / Indicators: Grimacing Pain Intervention(s): Monitored during session;Patient requesting pain meds-RN notified     Hand Dominance Right   Extremity/Trunk Assessment Upper Extremity Assessment Upper Extremity Assessment: Generalized weakness   Lower Extremity Assessment Lower Extremity Assessment: Defer to  PT evaluation   Cervical / Trunk Assessment Cervical / Trunk Assessment: Normal   Communication Communication Communication: Prefers language other than English([place] - daughter translated )   Cognition Arousal/Alertness:  Awake/alert;Lethargic Behavior During Therapy: WFL for tasks assessed/performed Overall Cognitive Status: Difficult to assess                                 General Comments: appears WFL. family present to translate   General Comments  Pt fatigued at end of session HR 133, 02 sats 100% on 3L supplemental 02, BP 143/87    Exercises     Shoulder Instructions      Home Living Family/patient expects to be discharged to:: Private residence Living Arrangements: Children Available Help at Discharge: Family Type of Home: House Home Access: Stairs to enter Technical brewer of Steps: 2 +1   Home Layout: One level     Bathroom Shower/Tub: Walk-in shower;Door   [employer] Toilet: Standard     Home Equipment: Hand held shower head          Prior Functioning/Environment Level of Independence: Independent        Comments: Pt was working full time, 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 Problem List: Decreased strength;Decreased activity tolerance;Impaired balance (sitting and/or standing);Decreased knowledge of use of DME or AE;Pain;Cardiopulmonary status limiting activity      OT Treatment/Interventions: Self-care/ADL training;Therapeutic exercise;Energy conservation;DME and/or AE instruction;Therapeutic activities;Patient/family education;Balance training    OT Goals(Current goals can be found in the care plan section) Acute Rehab OT Goals Patient Stated Goal: To get stronger and go home  OT Goal Formulation: With patient/family Time For Goal Achievement: 03/21/17 Potential to Achieve Goals: Good ADL Goals Pt Will Perform Grooming: with supervision;standing Pt Will Perform Upper Body Bathing: with set-up;with supervision;sitting Pt Will Perform Lower Body Bathing: with min guard assist;sit to/from stand Pt Will Perform Upper Body Dressing: with set-up;with supervision;sitting Pt Will Perform Lower Body  Dressing: with min guard assist;sit to/from stand Pt Will Transfer to Toilet: with min guard assist;ambulating;regular height toilet;bedside commode;grab bars Pt Will Perform Toileting - Clothing Manipulation and hygiene: with min guard assist;sit to/from stand  OT Frequency: Min 2X/week   Barriers to D/C:            Co-evaluation              AM-PAC PT "6 Clicks" Daily Activity     Outcome Measure Help from another person eating meals?: None Help from another person taking care of personal grooming?: A Little Help from another person toileting, which includes using toliet, bedpan, or urinal?: A Little Help from another person bathing (including washing, rinsing, drying)?: A Lot Help from another person to put on and taking off regular upper body clothing?: A Little Help from another person to put on and taking off regular lower body clothing?: A Lot 6 Click Score: 17   End of Session Equipment Utilized During Treatment: Gait belt;Rolling walker;Oxygen Nurse Communication: Mobility status;Patient requests pain meds  Activity Tolerance: Patient limited by fatigue Patient left: in chair;with call bell/phone within reach;with family/visitor present  OT Visit Diagnosis: Unsteadiness on feet (R26.81);Pain Pain - part of body: (abdominal )                Time: 3790-2409 OT Time Calculation (min): 20 min Charges:  OT General Charges $OT  Visit: 1 Visit OT Evaluation $OT Eval Moderate Complexity: 1 Mod G-Codes:     [facility], OTR/L [phone]   Lucille Passy M 03/07/2017, 2:03 PM

## 2017-03-08 ENCOUNTER — Inpatient Hospital Stay (HOSPITAL_COMMUNITY): Payer: 59

## 2017-03-08 DIAGNOSIS — E46 Unspecified protein-calorie malnutrition: Secondary | ICD-10-CM

## 2017-03-08 DIAGNOSIS — R64 Cachexia: Secondary | ICD-10-CM

## 2017-03-08 DIAGNOSIS — D72829 Elevated white blood cell count, unspecified: Secondary | ICD-10-CM

## 2017-03-08 DIAGNOSIS — K56600 Partial intestinal obstruction, unspecified as to cause: Secondary | ICD-10-CM

## 2017-03-08 LAB — GLUCOSE, CAPILLARY
GLUCOSE-CAPILLARY: 149 mg/dL — AB (ref 65–99)
Glucose-Capillary: 102 mg/dL — ABNORMAL HIGH (ref 65–99)
Glucose-Capillary: 118 mg/dL — ABNORMAL HIGH (ref 65–99)
Glucose-Capillary: 176 mg/dL — ABNORMAL HIGH (ref 65–99)

## 2017-03-08 LAB — COMPREHENSIVE METABOLIC PANEL
ALBUMIN: 2.5 g/dL — AB (ref 3.5–5.0)
ALK PHOS: 83 U/L (ref 38–126)
ALT: 32 U/L (ref 14–54)
AST: 25 U/L (ref 15–41)
Anion gap: 11 (ref 5–15)
BILIRUBIN TOTAL: 0.7 mg/dL (ref 0.3–1.2)
BUN: 17 mg/dL (ref 6–20)
CALCIUM: 7.8 mg/dL — AB (ref 8.9–10.3)
CO2: 35 mmol/L — ABNORMAL HIGH (ref 22–32)
CREATININE: 0.52 mg/dL (ref 0.44–1.00)
Chloride: 90 mmol/L — ABNORMAL LOW (ref 101–111)
GFR calc Af Amer: >60 mL/min (ref >60)
GFR calc non Af Amer: >60 mL/min (ref >60)
GLUCOSE: 110 mg/dL — AB (ref 65–99)
Potassium: 3.7 mmol/L (ref 3.5–5.1)
Sodium: 136 mmol/L (ref 135–145)
TOTAL PROTEIN: 5.8 g/dL — AB (ref 6.5–8.1)

## 2017-03-08 LAB — CBC WITH DIFFERENTIAL/PLATELET
BASOS ABS: 0 10*3/uL (ref 0.0–0.1)
BASOS PCT: 0 %
Eosinophils Absolute: 0 10*3/uL (ref 0.0–0.7)
Eosinophils Relative: 0 %
HEMATOCRIT: 34.6 % — AB (ref 36.0–46.0)
Hemoglobin: 10.6 g/dL — ABNORMAL LOW (ref 12.0–15.0)
LYMPHS PCT: 6 %
Lymphs Abs: 1.2 10*3/uL (ref 0.7–4.0)
MCH: 25.1 pg — ABNORMAL LOW (ref 26.0–34.0)
MCHC: 30.6 g/dL (ref 30.0–36.0)
MCV: 81.8 fL (ref 78.0–100.0)
MONOS PCT: 7 %
Monocytes Absolute: 1.3 10*3/uL — ABNORMAL HIGH (ref 0.1–1.0)
NEUTROS ABS: 16.3 10*3/uL — AB (ref 1.7–7.7)
NEUTROS PCT: 87 %
Platelets: 176 10*3/uL (ref 150–400)
RBC: 4.23 MIL/uL (ref 3.87–5.11)
RDW: 14.8 % (ref 11.5–15.5)
WBC: 18.8 10*3/uL — ABNORMAL HIGH (ref 4.0–10.5)

## 2017-03-08 LAB — MAGNESIUM: Magnesium: 1.8 mg/dL (ref 1.7–2.4)

## 2017-03-08 LAB — PHOSPHORUS: Phosphorus: 2.9 mg/dL (ref 2.5–4.6)

## 2017-03-08 MED ORDER — POTASSIUM PHOSPHATES 15 MMOLE/5ML IV SOLN
20.0000 meq | Freq: Once | INTRAVENOUS | Status: AC
  Administered 2017-03-08: 20 meq via INTRAVENOUS
  Filled 2017-03-08: qty 4.55

## 2017-03-08 MED ORDER — MAGNESIUM SULFATE IN D5W 1-5 GM/100ML-% IV SOLN
1.0000 g | Freq: Once | INTRAVENOUS | Status: AC
  Administered 2017-03-08: 1 g via INTRAVENOUS
  Filled 2017-03-08: qty 100

## 2017-03-08 NOTE — Progress Notes (Signed)
Jessica Gregory is about the same from what I can tell by the past notes.  She really is not able to eat much.  She appears somewhat cachectic.  She does have some abdominal bloating.  She does have some abdominal pain.  I am sure that this is all from her underlying malignancy.  I am sure that she probably still has some element of partial small bowel obstruction.  She just is not able to eat much.  She is doing some PT and OT.  Again, it is hard to say how much she really is able to accomplish.  Her labs show her magnesium to be a little bit low at 1.8.  Her potassium 3.7.  Her albumin is only 2.5.  Her white cell count is elevated at 18.8.  Hemoglobin 10.6.  Platelet count 176,000.  On her physical exam, this is a somewhat cachectic, ill-appearing Jessica Gregory female.  Her vital signs show temperature 97.7.  Pulse 114.  Blood pressure 120/75.  Her head neck exam shows slightly dry oral mucosa.  She has no obvious adenopathy in the neck.  She has some temporal muscle wasting.  Her lungs sound relatively clear bilaterally.  Cardiac exam tachycardic but regular.  Abdomen is somewhat distended.  Bowel sounds are decreased.  She has no obvious fluid wave.  She has some tenderness over on the right side.  Extremities does show some swelling.  Neurological exam shows no obvious neurological deficits.  Jessica Gregory is a 67 year old Jessica Gregory female.  She has a likely peritoneal carcinoma.  By her last CT scan, there is a question of her having gallbladder cancer.  Given that she is from [place] gallbladder cancer is certainly would be a possibility.  I definitely agree with Dr. Lebron Conners about her prognosis.  Given her current clinical state, I would be surprised if she ever got better and well enough to take any more treatment.  Her main problem is nutritional.  She looks markedly malnourished.  If she has partial bowel obstruction, this would definitely make it difficult for her to take in substantial  nutrition.  We will check another abdominal film on her.  This might help [place] decide how we can try to improve her status.  I know the nursing staff on [address] doing a fantastic job trying to help her.  Family still wishes to pursue aggressive interventions for the patient.  Jessica Haw, MD  Jessica Gregory 5:22

## 2017-03-09 DIAGNOSIS — R1011 Right upper quadrant pain: Secondary | ICD-10-CM

## 2017-03-09 LAB — CBC WITH DIFFERENTIAL/PLATELET
BASOS PCT: 0 %
Basophils Absolute: 0 10*3/uL (ref 0.0–0.1)
EOS ABS: 0 10*3/uL (ref 0.0–0.7)
EOS PCT: 0 %
HCT: 33.7 % — ABNORMAL LOW (ref 36.0–46.0)
HEMOGLOBIN: 10.5 g/dL — AB (ref 12.0–15.0)
LYMPHS ABS: 0.9 10*3/uL (ref 0.7–4.0)
Lymphocytes Relative: 4 %
MCH: 25.4 pg — AB (ref 26.0–34.0)
MCHC: 31.2 g/dL (ref 30.0–36.0)
MCV: 81.4 fL (ref 78.0–100.0)
MONO ABS: 1.1 10*3/uL — AB (ref 0.1–1.0)
MONOS PCT: 6 %
NEUTROS PCT: 90 %
Neutro Abs: 18.7 10*3/uL — ABNORMAL HIGH (ref 1.7–7.7)
Platelets: 208 10*3/uL (ref 150–400)
RBC: 4.14 MIL/uL (ref 3.87–5.11)
RDW: 14.8 % (ref 11.5–15.5)
WBC: 20.7 10*3/uL — ABNORMAL HIGH (ref 4.0–10.5)

## 2017-03-09 LAB — COMPREHENSIVE METABOLIC PANEL
ALK PHOS: 75 U/L (ref 38–126)
ALT: 30 U/L (ref 14–54)
ANION GAP: 12 (ref 5–15)
AST: 23 U/L (ref 15–41)
Albumin: 2.4 g/dL — ABNORMAL LOW (ref 3.5–5.0)
BILIRUBIN TOTAL: 1 mg/dL (ref 0.3–1.2)
BUN: 18 mg/dL (ref 6–20)
CALCIUM: 8.1 mg/dL — AB (ref 8.9–10.3)
CO2: 35 mmol/L — ABNORMAL HIGH (ref 22–32)
CREATININE: 0.54 mg/dL (ref 0.44–1.00)
Chloride: 89 mmol/L — ABNORMAL LOW (ref 101–111)
GFR calc Af Amer: >60 mL/min (ref >60)
Glucose, Bld: 164 mg/dL — ABNORMAL HIGH (ref 65–99)
Potassium: 4 mmol/L (ref 3.5–5.1)
SODIUM: 136 mmol/L (ref 135–145)
Total Protein: 5.7 g/dL — ABNORMAL LOW (ref 6.5–8.1)

## 2017-03-09 LAB — GLUCOSE, CAPILLARY
GLUCOSE-CAPILLARY: 143 mg/dL — AB (ref 65–99)
GLUCOSE-CAPILLARY: 85 mg/dL (ref 65–99)
Glucose-Capillary: 125 mg/dL — ABNORMAL HIGH (ref 65–99)
Glucose-Capillary: 86 mg/dL (ref 65–99)

## 2017-03-09 LAB — MAGNESIUM: Magnesium: 2 mg/dL (ref 1.7–2.4)

## 2017-03-09 LAB — PHOSPHORUS: PHOSPHORUS: 3 mg/dL (ref 2.5–4.6)

## 2017-03-09 MED ORDER — OCTREOTIDE ACETATE 50 MCG/ML IJ SOLN
100.0000 ug | Freq: Three times a day (TID) | INTRAMUSCULAR | Status: DC
  Administered 2017-03-09 (×2): 100 ug via SUBCUTANEOUS
  Administered 2017-03-10: 50 ug via SUBCUTANEOUS
  Administered 2017-03-10 – 2017-03-14 (×10): 100 ug via SUBCUTANEOUS
  Filled 2017-03-09 (×17): qty 2

## 2017-03-09 NOTE — Progress Notes (Signed)
Was called to the room and found this patient, Jessica Gregory sitting on the floor next to the bed.. This patinet's husband stated, " We went to the restroom and I was unable to get her to the bed, I let her sit on the floor." This patiet denies any discomfort. No evidences of injury noted. [facility] coordinator and [facility] were notified. No new orders given at this time. Instructed and informed this patient and her husband to please let staff be present and assist A. Conery with all ambulating and BRP. Not to get patient OOB/chair without staff being present. Returned  understanding noted.

## 2017-03-09 NOTE — Progress Notes (Signed)
Occupational Therapy Treatment Patient Details Name: Jessica Gregory MRN: [id number] DOB: 19-Dec-1950 Today's Date: 03/09/2017    History of present illness 67 y.o. female with medical history significant of history of diabetes mellitus type 2, hyperlipidemia was sent to the ER for further evaluation due to abnormal CT scan and and patient feeling generally ill.  Patient has had abdominal discomfort for past couple of months. 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 Has had 1 diagnostic paracentsis and one pallaitive paracentesis thus far   OT comments  Ambulated to bathroom for adl.  Pt performed UB with min A but ran out of steam and total A needed for LB  Follow Up Recommendations  Home health OT;Supervision/Assistance - 24 hour    Equipment Recommendations  3 in 1 bedside commode    Recommendations for Other Services      Precautions / Restrictions Precautions Precautions: Fall Precaution Comments: watch O2 and HR        Mobility Bed Mobility               General bed mobility comments: Pt sitting in recliner   Transfers   Equipment used: Rolling walker (2 wheeled)   Sit to Stand: Min guard         General transfer comment: for safety; cues for UE placement    Balance                                           ADL either performed or assessed with clinical judgement   ADL           Upper Body Bathing: Minimal assistance;Sitting   Lower Body Bathing: Total assistance;Sit to/from stand   Upper Body Dressing : Minimal assistance;Sitting   Lower Body Dressing: Total assistance;Sit to/from stand   Toilet Transfer: Minimal assistance;Ambulation;BSC;RW;Min guard             General ADL Comments: min A to help control distance of walker.  Pt ambulated to bathroom, sat on BSC and performed UB adls.  Therapist assisted with LB as pt shook head no; fatiques  easily.     Vision       Perception     Praxis      Cognition Arousal/Alertness: Awake/alert;Lethargic Behavior During Therapy: WFL for tasks assessed/performed Overall Cognitive Status: Difficult to assess                                          Exercises     Shoulder Instructions       General Comments      Pertinent Vitals/ Pain       Pain Assessment: Faces Faces Pain Scale: Hurts whole lot Pain Location: peri area Pain Descriptors / Indicators: Grimacing Pain Intervention(s): Limited activity within patient's tolerance;Monitored during session(asked RN to check skin; back red/discolored area)  Home Living                                          Prior Functioning/Environment              Frequency           Progress Toward  Goals  OT Goals(current goals can now be found in the care plan section)  Progress towards OT goals: Not progressing toward goals - comment(goals may need to be downgraded next visit)     Plan      Co-evaluation                 AM-PAC PT "6 Clicks" Daily Activity     Outcome Measure   Help from another person eating meals?: None Help from another person taking care of personal grooming?: A Little Help from another person toileting, which includes using toliet, bedpan, or urinal?: A Lot Help from another person bathing (including washing, rinsing, drying)?: A Lot Help from another person to put on and taking off regular upper body clothing?: A Little Help from another person to put on and taking off regular lower body clothing?: Total 6 Click Score: 15    End of Session    OT Visit Diagnosis: Unsteadiness on feet (R26.81)   Activity Tolerance Patient limited by fatigue   Patient Left in chair;with call bell/phone within reach;with family/visitor present   Nurse Communication          Time: 1443-1540 OT Time Calculation (min): 24 min  Charges: OT General Charges $OT  Visit: 1 Visit OT Treatments $Self Care/Home Management : 23-37 mins  Lesle Chris, OTR/L [phone] 03/09/2017   [place] 03/09/2017, 8:54 AM

## 2017-03-09 NOTE — Progress Notes (Signed)
Jessica Gregory actually looks better this morning.  She is sitting in a chair.  She is eating a little bit of grits.  Did get a abdominal KUB yesterday.  It looks like she has ileus versus distal large bowel obstruction.  I do not think she is had a bowel movement.  She did get potassium and magnesium yesterday.  They are having a very difficult time getting blood for lab work today.  I think we can hold off on that lab work today just to give her a break.  Her daughter is appreciative of this.  I am going to try her on some Sandostatin to see if this helps with her intestines.  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  I think it is worthwhile trying to see if it helps her symptoms which could help her quality of life.  She slept pretty well.  She is still having some discomfort in the right upper abdomen.  There is no fever.  She has had no vomiting.  She has had no bleeding.  On her physical exam, her vital signs are temperature of 97.8.  Pulse 120.  Blood pressure 102/68.  Her lungs are clear.  Oral exam shows no mucositis.  Cardiac exam is tachycardic but regular.  Abdomen is still somewhat distended.  There is no guarding.  Bowel sounds are present.  Extremities shows some decrease in her edema.  From my perspective, this is all about quality of life for Jessica Gregory.  I realize that she and her family want aggressive interventions.  However, I think that the best that we can do for her is to try and improve her quality of life so that she can enjoy herself.  Hopefully she will be able to eat a little bit better.  May be, the Sandostatin will help.  Again, we will hold off on labs today.  Her daughter is in agreement with my recommendations.  As always, the staff on [address] are doing a fantastic job trying to help her and trying to improve her comfort.  Lattie Haw, MD  Psalms 5:3

## 2017-03-10 DIAGNOSIS — K56609 Unspecified intestinal obstruction, unspecified as to partial versus complete obstruction: Secondary | ICD-10-CM

## 2017-03-10 LAB — GLUCOSE, CAPILLARY
GLUCOSE-CAPILLARY: 95 mg/dL (ref 65–99)
Glucose-Capillary: 99 mg/dL (ref 65–99)
Glucose-Capillary: 151 mg/dL — ABNORMAL HIGH (ref 65–99)
Glucose-Capillary: 196 mg/dL — ABNORMAL HIGH (ref 65–99)
Glucose-Capillary: 197 mg/dL — ABNORMAL HIGH (ref 65–99)

## 2017-03-10 LAB — CBC WITH DIFFERENTIAL/PLATELET
BASOS PCT: 0 %
Basophils Absolute: 0 10*3/uL (ref 0.0–0.1)
EOS ABS: 0 10*3/uL (ref 0.0–0.7)
EOS PCT: 0 %
HCT: 37.1 % (ref 36.0–46.0)
Hemoglobin: 11.4 g/dL — ABNORMAL LOW (ref 12.0–15.0)
Lymphocytes Relative: 4 %
Lymphs Abs: 1.1 10*3/uL (ref 0.7–4.0)
MCH: 25.6 pg — AB (ref 26.0–34.0)
MCHC: 30.7 g/dL (ref 30.0–36.0)
MCV: 83.2 fL (ref 78.0–100.0)
MONO ABS: 1.1 10*3/uL — AB (ref 0.1–1.0)
Monocytes Relative: 4 %
NEUTROS ABS: 25.4 10*3/uL — AB (ref 1.7–7.7)
NEUTROS PCT: 92 %
PLATELETS: 214 10*3/uL (ref 150–400)
RBC: 4.46 MIL/uL (ref 3.87–5.11)
RDW: 15 % (ref 11.5–15.5)
WBC: 27.6 10*3/uL — ABNORMAL HIGH (ref 4.0–10.5)

## 2017-03-10 LAB — COMPREHENSIVE METABOLIC PANEL
ALK PHOS: 88 U/L (ref 38–126)
ALT: 32 U/L (ref 14–54)
ANION GAP: 16 — AB (ref 5–15)
AST: 29 U/L (ref 15–41)
Albumin: 2.4 g/dL — ABNORMAL LOW (ref 3.5–5.0)
BUN: 20 mg/dL (ref 6–20)
CALCIUM: 8.1 mg/dL — AB (ref 8.9–10.3)
CHLORIDE: 90 mmol/L — AB (ref 101–111)
CO2: 29 mmol/L (ref 22–32)
CREATININE: 0.6 mg/dL (ref 0.44–1.00)
Glucose, Bld: 159 mg/dL — ABNORMAL HIGH (ref 65–99)
Potassium: 4.2 mmol/L (ref 3.5–5.1)
SODIUM: 135 mmol/L (ref 135–145)
Total Bilirubin: 1 mg/dL (ref 0.3–1.2)
Total Protein: 6.1 g/dL — ABNORMAL LOW (ref 6.5–8.1)

## 2017-03-10 LAB — PHOSPHORUS: PHOSPHORUS: 4.2 mg/dL (ref 2.5–4.6)

## 2017-03-10 LAB — MAGNESIUM: MAGNESIUM: 1.9 mg/dL (ref 1.7–2.4)

## 2017-03-10 MED ORDER — ALTEPLASE 2 MG IJ SOLR
2.0000 mg | Freq: Once | INTRAMUSCULAR | Status: AC
  Administered 2017-03-10: 2 mg
  Filled 2017-03-10: qty 2

## 2017-03-10 MED ORDER — HYDROMORPHONE HCL 4 MG PO TABS
2.0000 mg | ORAL_TABLET | ORAL | Status: DC | PRN
Start: 2017-03-10 — End: 2017-03-14
  Administered 2017-03-10 – 2017-03-13 (×4): 4 mg via ORAL
  Administered 2017-03-13 – 2017-03-14 (×2): 2 mg via ORAL
  Filled 2017-03-10 (×6): qty 1

## 2017-03-10 MED ORDER — FLUCONAZOLE 100MG IVPB
100.0000 mg | INTRAVENOUS | Status: DC
  Administered 2017-03-10: 100 mg via INTRAVENOUS
  Filled 2017-03-10 (×2): qty 50

## 2017-03-10 MED ORDER — POLYETHYLENE GLYCOL 3350 17 G PO PACK
17.0000 g | PACK | Freq: Every day | ORAL | Status: DC
  Administered 2017-03-12 – 2017-03-16 (×4): 17 g via ORAL
  Filled 2017-03-10 (×6): qty 1

## 2017-03-10 NOTE — Progress Notes (Signed)
Physical Therapy Treatment Patient Details Name: Jessica Gregory MRN: [id number] DOB: Feb 05, 1950 Today's Date: 03/10/2017    History of Present Illness 67 y.o. female with medical history significant of history of diabetes mellitus type 2, hyperlipidemia was sent to the ER for further evaluation due to abnormal CT scan and and patient feeling generally ill.  Patient has had abdominal discomfort for past couple of months. 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 Has had 1 diagnostic paracentsis and one pallaitive paracentesis thus far    PT Comments    Pt ambulated 150' with RW, HR 130 at rest, HR up to 140 max with ambulation, SaO2 99% on 3L O2 walking. Ambulation distance limited by PT due to elevated HR, pt asymptomatic.   Follow Up Recommendations  Supervision/Assistance - 24 hour;No PT follow up     Equipment Recommendations  Rolling walker with 5" wheels    Recommendations for Other Services       Precautions / Restrictions Precautions Precautions: Fall Precaution Comments: watch O2 and HR  Restrictions Weight Bearing Restrictions: No    Mobility  Bed Mobility               General bed mobility comments: Pt sitting in recliner   Transfers   Equipment used: Rolling walker (2 wheeled) Transfers: Sit to/from Stand Sit to Stand: Min guard         General transfer comment: for safety; cues for UE placement  Ambulation/Gait Ambulation/Gait assistance: Min guard Ambulation Distance (Feet): 150 Feet Assistive device: Rolling walker (2 wheeled) Gait Pattern/deviations: Decreased step length - left;Decreased step length - right;Decreased stride length   Gait velocity interpretation: Below normal speed for age/gender General Gait Details: HR 135-140 with walking, SaO2 99% on 3L O2 walking, no dyspnea noted, decreased velocity, distance limited by PT due to elevated HR   Stairs             Wheelchair Mobility    Modified Rankin (Stroke Patients Only)       Balance Overall balance assessment: Needs assistance Sitting-balance support: No upper extremity supported;Feet supported Sitting balance-Leahy Scale: Good     Standing balance support: No upper extremity supported Standing balance-Leahy Scale: Fair                              Cognition Arousal/Alertness: Awake/alert Behavior During Therapy: WFL for tasks assessed/performed Overall Cognitive Status: Difficult to assess                                 General Comments: appears WFL. family present to translate      Exercises      General Comments        Pertinent Vitals/Pain Pain Score: 2  Pain Location: abdomen Pain Descriptors / Indicators: Aching Pain Intervention(s): Limited activity within patient's tolerance;Monitored during session    Home Living                      Prior Function            PT Goals (current goals can now be found in the care plan section) Acute Rehab PT Goals Patient Stated Goal: likes to garden, care for grandkids PT Goal Formulation: With family Time For Goal Achievement: 03/24/17 Potential to Achieve Goals: Fair Progress towards PT goals: Progressing toward goals  Frequency    Min 3X/week      PT Plan Current plan remains appropriate    Co-evaluation              AM-PAC PT "6 Clicks" Daily Activity  Outcome Measure  Difficulty turning over in bed (including adjusting bedclothes, sheets and blankets)?: A Lot Difficulty moving from lying on back to sitting on the side of the bed? : A Lot Difficulty sitting down on and standing up from a chair with arms (e.g., wheelchair, bedside commode, etc,.)?: A Little Help needed moving to and from a bed to chair (including a wheelchair)?: A Little Help needed walking in hospital room?: A Little Help needed climbing 3-5 steps with a railing? : A Little 6 Click  Score: 16    End of Session Equipment Utilized During Treatment: Oxygen Activity Tolerance: Patient tolerated treatment well Patient left: in chair;with call bell/phone within reach;with family/visitor present   PT Visit Diagnosis: Muscle weakness (generalized) (M62.81);Difficulty in walking, not elsewhere classified (R26.2);Pain     Time: 0315-9458 PT Time Calculation (min) (ACUTE ONLY): 15 min  Charges:  $Gait Training: 8-22 mins                    G Codes:          Philomena Doheny 03/10/2017, 11:43 AM [phone]

## 2017-03-10 NOTE — Progress Notes (Signed)
Daily Progress Note   Patient Name: Jessica Gregory       Date: 03/10/2017 DOB: 1951/01/17  Age: 67 y.o. MRN#: [id number] Attending Physician: Ardath Sax, MD Primary Care Physician: Forrest Moron, MD Admit Date: 02/09/2017  Reason for Consultation/Follow-up: Establishing goals of care, Paint control, and Psychosocial/spiritual support   Subjective: Patient sitting up in chair and daughter is at bedside. Daughter reports her mom has not slept much since Saturday due to pain and discomfort. Reports that pain medication was helping more with the pain but it seems as though she has gotten used to it because it is not helping as much as she would like now and pain is coming back more frequently. Patient is dosing off while up in chair. Her appetite remains poor and oral intake remains poor, see below  Length of Stay: 24  Current Medications: Scheduled Meds:  . Chlorhexidine Gluconate Cloth  6 each Topical Q0600  . enoxaparin (LOVENOX) injection  40 mg Subcutaneous Q24H  . furosemide  40 mg Oral Daily  . insulin aspart  0-9 Units Subcutaneous TID WC  . multivitamin  15 mL Oral Daily  . octreotide  100 mcg Subcutaneous Q8H  . pantoprazole (PROTONIX) IV  40 mg Intravenous Daily  . polyethylene glycol  17 g Oral Daily  . protein supplement  8 oz Oral BID  . psyllium  1 packet Oral Daily  . senna-docusate  1 tablet Oral BID  . simethicone  40 mg Oral QID  . sodium chloride flush  10-40 mL Intracatheter Q12H    Continuous Infusions:   PRN Meds: guaiFENesin-dextromethorphan, HYDROmorphone, levalbuterol, LORazepam, prochlorperazine, sodium chloride flush, sodium chloride flush, sodium chloride flush  Physical Exam  Constitutional: She appears cachectic. She appears ill.    Musculoskeletal:       Right shoulder: She exhibits decreased strength.  Generalized weakness   Neurological: She is alert.  No edema         Vital Signs: BP 115/83 (BP Location: Right Arm)   Pulse (!) 122   Temp 98.5 F (36.9 C) (Oral)   Resp 19   Ht 4' 10.5" (1.486 m)   Wt 50.8 kg (112 lb)   SpO2 100%   BMI 23.01 kg/m  SpO2: SpO2: 100 % O2 Device: O2 Device: Not Delivered O2 Flow Rate:  O2 Flow Rate (L/min): 3 L/min  Intake/output summary: No intake or output data in the 24 hours ending 03/10/17 1114 LBM: Last BM Date: 03/06/17 Baseline Weight: Weight: 55.2 kg (121 lb 11.1 oz) Most recent weight: Weight: 50.8 kg (112 lb)       Palliative Assessment/Data: 30%      Patient Active Problem List   Diagnosis Date Noted  . SBO (small bowel obstruction) ([place])   . Constipation 03/03/2017  . Fever   . Abdominal distention   . Acute delirium   . Hyponatremia   . Acute respiratory failure ([place]) 02/26/2017  . Electrolyte imbalance 02/26/2017  . Hypokalemia 02/25/2017  . Intra-abdominal malignant neoplasm ([place])   . Ascites   . SOB (shortness of breath)   . Cancer associated pain 02/21/2017  . Nausea   . Admission for chemotherapy   . Peritoneal carcinomatosis ([place])   . Metastatic cancer ([place])   . Sepsis ([place]) 02/14/2017  . Diffuse papular rash 08/01/2016  . Psoriasis 08/01/2016  . Chronic hepatitis B ([place]) 08/01/2016  . Hepatitis, viral 03/01/2015  . Controlled type 2 diabetes mellitus without complication, without long-term current use of insulin ([place]) 03/01/2015  . Uncontrolled diabetes mellitus ([place]) 11/21/2014  . Language barrier 11/16/2014    Palliative Care Assessment & Plan   Patient Profile: 67 y.o. 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 Patient has had abdominal discomfort for past couple of months and  underwent CT scanning at urgent care, which showed concerns for widespread metastatic disease. According to the daughter patient has not been seeing any physician and was diagnosed with diabetes mellitus type 2 about 6 months ago and has been on metformin.  No other complaints including nausea, vomiting, unintentional weight loss.  She has had poor appetite over the last 2 weeks and attributes it to less desire to eat.  Previously patient has not had any screening procedures/imaging such as colonoscopy or mammography.  Denies any family history of malignancies.    Assessment:  Patient sitting up in chair in no distress. Daughter is at bedside and is concerned with pain control and diet. Discussed with daughter the importance of staying on liquid diet due to ileus/SBO. Pain medication will be adjusted for comfort and encouraged to utilize ativan to assist with sleep. Discussed with daughter the overall status of her mother and poor prognosis due to widespread malignancy. Daughter continues to state she wants her mother to remain a FULL code despite knowing that if she declined interventions would most likely cause more harm than good. Daughter verbalizes understanding of her moms current and future health status and states she is still trying to decide what is best for her mom. Husband remains absence and away from health care decisions and needs.   SBO Sepsis/delirium multi factorial. pulm mets  Possible GB malignancy or Possible ovarian malignancy Recurrent malignant ascites.  Uncontrolled multi factorial pain.  Cancer cachexia/anorexia  Severe calorie/protein malnutrition  Recommendations/Plan:  Maintain FULL code status   Will increase prn Dilaudid 2-4mg  for pain control  Patient will need 24 hour supervision at disposition   Will continue to follow and support patient and family during hospitalization with hopes daughter will make some much needed decisions.   Goals of Care and  Additional Recommendations:  Limitations on Scope of Treatment: Full Scope Treatment  Code Status:    Code Status Orders  (From admission, onward)  Start     Ordered   02/27/17 1315  Full code  Continuous     02/27/17 1314    Code Status History    Date Active Date Inactive Code Status Order ID Comments User Context   02/26/2017 08:05 02/27/2017 13:14 DNR [id number]  Ardath Sax, MD Inpatient   02/14/2017 00:35 02/26/2017 08:05 Full Code [id number]  Damita Lack, MD ED       Prognosis:  Guarded with poor prognosis given widespread malignancy   Discharge Planning:  To Be Determined  Care plan was discussed with patient and daughter.   Thank you for allowing the Palliative Medicine Team to assist in the care of this patient.   Total Time 35 min Prolonged Time Billed  NO       Greater than 50%  of this time was spent counseling and coordinating care related to the above assessment and plan.  Alda Lea, NP-C Palliative Medicine Team  Phone: [phone] Fax: [phone]  Please contact Palliative Medicine Team phone at [phone] for questions and concerns.   PMT Addendum: Patient seen and examined along with Ms Cousar NP. We reviewed the patient's overall condition, med history and goals of care again with patient and daughter.  While daughter is tearful and recognizes that the patient has ongoing decline, she is not willing to discuss further about DNR DNI/hospice level of care.  We will continue to adjust pain medications and monitor.  All of the daughter's concerns addressed to the best of our abilities.  Labs vitals and recent imaging noted and discussed with daughter [name] who is at the bedside.  Patient remains at high risk for sudden decompensation, not safe for discharge from the hospital at this time, in my opinion.  PMT to follow.  Remains full code for now.  Awake alert sitting up in chair S1 S2 Clear Generalized  weakness Abdomen generalized tenderness Trace edema Non focal  35 minutes spent.  Loistine Chance MD [facility] health palliative medicine team [phone]

## 2017-03-10 NOTE — Progress Notes (Signed)
IP PROGRESS NOTE  Subjective:  Patient has not had a bowel movement since Friday.  Also has been sleeping well since Saturday.  Believes that pain medication may be keeping her awake.  Continues to have discomfort in the abdomen, no emesis. Objective: Vital signs in last 24 hours: Blood pressure 115/83, pulse (!) 122, temperature 98.5 F (36.9 C), temperature source Oral, resp. rate 19, height 4' 10.5" (1.486 m), weight 112 lb (50.8 kg), SpO2 100 %.  Intake/Output from previous day: 02/10 0701 - 02/11 0700 In: 240 [P.O.:240] Out: -   Physical Exam: Sitting up in the chair.  Respirations appear to be comfortable, no outward signs of discomfort at this time.Marland Kitchen HEENT: Anicteric sclera, moist mucous membranes Lungs: Decreased breath sounds bilaterally with expiratory wheezing and frequent nonproductive cough Cardiac: S1/S2, regular, no murmurs Abdomen: Stable abdominal distention with diffuse tenderness  Lymph nodes: No palpable lymphadenopathy in the cervical, supraclavicular, axillary, or inguinal regions Neurologic: Unable to evaluate today Skin: No rash, petechiae, or ecchymosis. Musculoskeletal: Bilateral lower extremity swelling.   Lab Results: Recent Labs    03/08/17 0420 03/09/17 0342  WBC 18.8* 20.7*  HGB 10.6* 10.5*  HCT 34.6* 33.7*  PLT 176 208    BMET Recent Labs    03/08/17 0420 03/09/17 0342  NA 136 136  K 3.7 4.0  CL 90* 89*  CO2 35* 35*  GLUCOSE 110* 164*  BUN 17 18  CREATININE 0.52 0.54  CALCIUM 7.8* 8.1*    Lab Results  Component Value Date   CEA1 0.9 02/19/2017    Studies/Results: Dg Abd Portable 2v  Result Date: 03/08/2017 CLINICAL DATA:  Metastatic adenocarcinoma.  Small-bowel obstruction EXAM: PORTABLE ABDOMEN - 2 VIEW COMPARISON:  CT 03/02/2017 FINDINGS: Dilated bowel loops in the abdomen and pelvis. This appears to be a combination of large and small bowel. This may reflect ileus or distal colonic obstruction. Scattered air-fluid levels.  No free air organomegaly. IMPRESSION: Dilated large and small bowel loops, favor ileus. Cannot exclude distal colonic obstruction. Electronically Signed   By: Rolm Baptise M.D.   On: 03/08/2017 12:01    Medications: I have reviewed the patient's current medications.  Assessment/Plan: 67 year old female who presented with abdominal pain and partial bowel obstruction with findings of widely metastatic malignancy including peritoneal carcinomatosis, hepatic lesions, and multifocal bilateral pulmonary involvement.  Peritoneal fluid positive for presence of cells consistent with adenocarcinoma.    **SBO, partial/Ileus due to opioids: Recent CT of the abdomen pelvis demonstrates persistent small bowel distention consistent with at least partial obstruction.  In addition to that,: Appears distended with retention of stool which is likely due to opioid medications.  Lack of bowel movement over the past several days is worrisome for possible development of the complete bowel obstruction. No vomiting yet, but PO intake has decreased significantly --Unfortunately, cannot use methylnaltrexone due to presence of bowel obstruction. -Continue gentle laxatives   --Senna/Docusate + Psyllum + Miralax QDay --Continue Octreotide  **Sepsis/Delirium: Unclear if combination of severe pain, opioid medications, and possible infection combined to contribute to the presentation last night.  CT of the abdomen/pelvis demonstrates no evidence of free air in the peritoneum.  Essentially stable appearance of the known malignancy.  A possible foreign body is noted in the urethra.  Differential for that includes a nephrolith versus portion of the pessary.  No recurrent delirium so far.  Patient remains afebrile. --Will limit opioid medications to short-acting forms only apparently does not tolerate MS Contin that has been tried several  times  **Respiratory failure mixed hypoxemic/hypercarbic: Likely due to combination of malignant  infiltration of the lungs including lung infiltration by underlying malignancy including lymphangitic spread, pleural effusion secondary to the malignant ascites, and effects of significant levels of opioid medications delivered for pain control.  Seen and evaluated by pulmonology who is recommendations and input are greatly appreciated.  Bicarbonate level down to 36 today, likely due to diuresis with furosemide.  Echocardiogram was obtained 02/27/17 demonstrating preserved left ventricular ejection fraction.  Weight is stable over the past several days, I do not think that the fluid retention is the current problem although patient's lower extremity swelling has increased since yesterday.  Weight is down nearly 10 pounds over the weekend.  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  Nevertheless, distribution of the disease as well as elevation of CA 125 and normal CA19-9 with noncontributory immunohistochemical profile favor primary peritoneal or ovarian etiology. Received the initial chemotherapy with carboplatin AUC 5 + paclitaxel 125 mg/m Q21d on 02/21/17.  Repeat CT scan of the abdomen and pelvis demonstrates essentially stable findings.  Radiographic appearance is still very worrisome for possible primary gallbladder carcinoma based on the thickened gallbladder and appearance of direct hepatic invasion.  At this time, patient has received the first dose of systemic chemotherapy and no additional treatment will be administered until 03/14/17.  Between worsening bowel obstruction, rising leukocytosis, and lack of significant improvement in physical state of the  patient, I am becoming less and less optimistic about the likely outcome of the cancer management strategy we have pursued. --Daily labs  **Recurrent malignant ascites: Due to underlying malignancy.  Will reaccumulate rapidly until the cancer is addressed. Paracentesis 02/21/17, 02/24/17.  Persistent significant pain in the abdomen with increased bowel output.  Increasing pain is more likely to be associated with increased bowel mobility. --We will abstain from additional paracentesis at this point in time unless abdominal distention increases significantly.  **Cancer-associated pain: Patient seems to be able to stay awake enough at this current medication levels with no uncontrolled pain.  Pain control is reasonably stable.  I do not believe that the insomnia is due to Dilaudid at this time, but patient can use lorazepam for sleeping aid. --Current pain regimen:     --Hydromorphone 2 mg oral every 3 hours as needed  **Diabetes mellitus: As expected, glucose is higher and now that patient has received glucocorticoids with her chemotherapy. --Continue SSI for now, may have to increase to higher grade scale  **Malnutrition, combined calorie/protein, severe: Severe decrease in oral intake due to peritoneal carcinomatosis and partial bowel obstruction with pain exacerbated by food intake.  Patient has been seen by dietitian.  Phosphorus level has stabilized, albumin is slightly improved compared to the prior days. --Resume diet and advance as tolerated  **Electrolytes/Fluids:  Hyponatremia: Complex hyponatremia due to intravascular volume depletion, pain, nausea, metastatic disease in the liver.  Sodium has normalized  Hypokalemia: Potassium has dropped likely due to increased urine output and insufficient oral intake.  Replaced with IV KCl/KPhos, resolved without recurrence.  Improved potassium after replacement. --Continue furosemide 40 mg daily by mouth  **PPx:  --Continue pantoprazole for  stress ulcer prophylaxis  --Continue enoxaparin 40 mg subcu daily for  DVT prophylaxis  **Code Status:  --Full code: Previously, patient was DNR/DNI, but we had additional conversation yesterday and patient insists on revival to be attempted in case of cardiac or respiratory arrest.  Have explained that her likelihood of successful revival is negligible, likely less than 1% based on her extensive malignancy.  CODE STATUS reviewed again today outlining the poor prognosis in case of need for resuscitation.  Family continues to maintain for the patient to remain full code.  **Disposition:  --At this time, I am becoming increasingly concerned that patient course is not going to turn around.  Previously, patient and her family were not ready to accept the potential negative outcomes of this hospitalization.  But it is becoming more evident that I will treatment is unlikely to result in significant symptomatic improvement for the patient.  Patient and her daughter are not willing to consider transition to hospice at this time.  We will continue observation and continue discussions daily.   LOS: 24 days   Ardath Sax, MD   03/10/2017, 8:30 AM

## 2017-03-11 DIAGNOSIS — Z515 Encounter for palliative care: Secondary | ICD-10-CM

## 2017-03-11 LAB — CBC WITH DIFFERENTIAL/PLATELET
BASOS PCT: 0 %
Basophils Absolute: 0 10*3/uL (ref 0.0–0.1)
EOS PCT: 0 %
Eosinophils Absolute: 0 10*3/uL (ref 0.0–0.7)
HEMATOCRIT: 36.7 % (ref 36.0–46.0)
Hemoglobin: 11.2 g/dL — ABNORMAL LOW (ref 12.0–15.0)
LYMPHS PCT: 5 %
Lymphs Abs: 1.3 10*3/uL (ref 0.7–4.0)
MCH: 25.9 pg — ABNORMAL LOW (ref 26.0–34.0)
MCHC: 30.5 g/dL (ref 30.0–36.0)
MCV: 84.8 fL (ref 78.0–100.0)
MONOS PCT: 4 %
Monocytes Absolute: 1 10*3/uL (ref 0.1–1.0)
NEUTROS PCT: 91 %
Neutro Abs: 22.7 10*3/uL — ABNORMAL HIGH (ref 1.7–7.7)
Platelets: 264 10*3/uL (ref 150–400)
RBC: 4.33 MIL/uL (ref 3.87–5.11)
RDW: 15.2 % (ref 11.5–15.5)
WBC: 25 10*3/uL — AB (ref 4.0–10.5)

## 2017-03-11 LAB — COMPREHENSIVE METABOLIC PANEL
ALBUMIN: 2.4 g/dL — AB (ref 3.5–5.0)
ALT: 28 U/L (ref 14–54)
AST: 23 U/L (ref 15–41)
Alkaline Phosphatase: 89 U/L (ref 38–126)
Anion gap: 13 (ref 5–15)
BILIRUBIN TOTAL: 0.6 mg/dL (ref 0.3–1.2)
BUN: 24 mg/dL — AB (ref 6–20)
CHLORIDE: 90 mmol/L — AB (ref 101–111)
CO2: 32 mmol/L (ref 22–32)
CREATININE: 0.66 mg/dL (ref 0.44–1.00)
Calcium: 8.2 mg/dL — ABNORMAL LOW (ref 8.9–10.3)
GFR calc Af Amer: >60 mL/min (ref >60)
GFR calc non Af Amer: >60 mL/min (ref >60)
GLUCOSE: 161 mg/dL — AB (ref 65–99)
POTASSIUM: 4.4 mmol/L (ref 3.5–5.1)
Sodium: 135 mmol/L (ref 135–145)
TOTAL PROTEIN: 6.2 g/dL — AB (ref 6.5–8.1)

## 2017-03-11 LAB — PHOSPHORUS: Phosphorus: 3.8 mg/dL (ref 2.5–4.6)

## 2017-03-11 LAB — MAGNESIUM: MAGNESIUM: 1.9 mg/dL (ref 1.7–2.4)

## 2017-03-11 MED ORDER — PANTOPRAZOLE SODIUM 40 MG PO TBEC
40.0000 mg | DELAYED_RELEASE_TABLET | Freq: Every day | ORAL | Status: DC
  Administered 2017-03-11 – 2017-03-17 (×7): 40 mg via ORAL
  Filled 2017-03-11 (×7): qty 1

## 2017-03-11 MED ORDER — FLUCONAZOLE 100 MG PO TABS
100.0000 mg | ORAL_TABLET | Freq: Every day | ORAL | Status: DC
  Administered 2017-03-11 – 2017-03-16 (×6): 100 mg via ORAL
  Filled 2017-03-11 (×6): qty 1

## 2017-03-11 NOTE — Progress Notes (Signed)
The patient is receiving Protonix & Diflucan by the intravenous route.  Based on criteria approved by the Pharmacy and [place], the medication is being converted to the equivalent oral dose form.  These criteria include: -No active GI bleeding -Able to tolerate diet of full liquids (or better) or tube feeding -Able to tolerate other medications by the oral or enteral route  If you have any questions about this conversion, please contact the Pharmacy Department (phone [phone]).  Thank you. Eudelia Bunch, Pharm.D. [phone] 03/11/2017 9:25 AM

## 2017-03-11 NOTE — Progress Notes (Signed)
Occupational Therapy Treatment Patient Details Name: Jessica Gregory MRN: [id number] DOB: 12-19-1950 Today's Date: 03/11/2017    History of present illness 67 y.o. female with medical history significant of history of diabetes mellitus type 2, hyperlipidemia was sent to the ER for further evaluation due to abnormal CT scan and and patient feeling generally ill.  Patient has had abdominal discomfort for past couple of months. 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 Has had 1 diagnostic paracentsis and one pallaitive paracentesis thus far   OT comments  Pt with increased weakness/fatigue noted this session. Pt initially requiring MinA for room level functional mobility using RW though with increased fatigue (pt fatiguing rather quickly) required Mod-MaxA+2 for safe transfers/mobility due to Pt's bil LEs buckling (see comments below). Pt required MaxA (+2) for LB dressing and ModA+2 for toileting ADLs. Pending Pt progress may need to consider ST SNF stay prior to return home. Will continue to follow to progress Pt's safety and independence with ADLs and functional mobility and to determine safe POC at time of discharge.    Follow Up Recommendations  Home health OT;Supervision/Assistance - 24 hour;Other (comment)(pending Pt progress, may need to consider SNF )    Equipment Recommendations  3 in 1 bedside commode          Precautions / Restrictions Precautions Precautions: Fall Precaution Comments: watch O2 and HR  Restrictions Weight Bearing Restrictions: No       Mobility Bed Mobility               General bed mobility comments: Pt sitting EOB upon entering room   Transfers Overall transfer level: Needs assistance Equipment used: Rolling walker (2 wheeled) Transfers: Sit to/from Stand Sit to Stand: Mod assist;+2 physical assistance;+2 safety/equipment Stand pivot transfers: Mod assist;+2 physical  assistance;+2 safety/equipment       General transfer comment: Assist to boost into standing and steady at RW, +2 for safety/assist due to Pt bil LE weakness/buckling    Balance           Standing balance support: Bilateral upper extremity supported;During functional activity Standing balance-Leahy Scale: Poor                             ADL either performed or assessed with clinical judgement   ADL Overall ADL's : Needs assistance/impaired                     Lower Body Dressing: Sit to/from stand;Maximal assistance;+2 for safety/equipment;+2 for physical assistance Lower Body Dressing Details (indicate cue type and reason): doffing/donning mesh underwear  Toilet Transfer: Moderate assistance;Ambulation;Regular Toilet;Grab bars;RW;+2 for safety/equipment;Maximal assistance Toilet Transfer Details (indicate cue type and reason): see comments below  Toileting- Clothing Manipulation and Hygiene: Moderate assistance;+2 for physical assistance;Sit to/from stand Toileting - Clothing Manipulation Details (indicate cue type and reason): assist for clothing management, Pt completing peri-care in sitting      Functional mobility during ADLs: Moderate assistance;+2 for physical assistance;+2 for safety/equipment;Rolling walker General ADL Comments: Pt needing to void blatter upon entering room; requires assist to maintain RW closer to body when completing functional mobility, initially requiring MinA however upon entering bathroom and approaching toilet Pt bil LEs buckling, requiring Max-totalA to stabilize and ultimately MaxA+2 to correct and safely transfer to toilet (pt daughter and nursing student present to assist); Pt completing LB dressing to change mesh underwear with MaxA+2 for sit<>stand and safety;  ModA+2 to safely transfer to recliner placed at doorway; RN made aware of need for +2 assist                        Cognition Arousal/Alertness:  Awake/alert;Lethargic Behavior During Therapy: [facility] for tasks assessed/performed Overall Cognitive Status: Difficult to assess                                 General Comments: family present to translate; pt requiring increased time and cues for following directions                      daughter present during session; Pt noted to have wound/scrape on buttocks at start of session when standing from EOB, RN informed and already aware    Pertinent Vitals/ Pain       Pain Assessment: No/denies pain                                                          Frequency  Min 2X/week        Progress Toward Goals  OT Goals(current goals can now be found in the care plan section)  Progress towards OT goals: Goals drowngraded-see care plan  Acute Rehab OT Goals Patient Stated Goal: likes to garden, care for grandkids OT Goal Formulation: With patient/family Time For Goal Achievement: 03/21/17 Potential to Achieve Goals: Good  Plan Discharge plan remains appropriate;Other (comment)(may need to be updated pending Pt progress)                     AM-PAC PT "6 Clicks" Daily Activity     Outcome Measure   Help from another person eating meals?: None Help from another person taking care of personal grooming?: A Little Help from another person toileting, which includes using toliet, bedpan, or urinal?: A Lot Help from another person bathing (including washing, rinsing, drying)?: A Lot Help from another person to put on and taking off regular upper body clothing?: A Little Help from another person to put on and taking off regular lower body clothing?: Total 6 Click Score: 15    End of Session Equipment Utilized During Treatment: Gait belt;Rolling walker;Oxygen  OT Visit Diagnosis: Unsteadiness on feet (R26.81);Muscle weakness (generalized) (M62.81)   Activity Tolerance Patient tolerated treatment well;Patient limited by fatigue    Patient Left in chair;with call bell/phone within reach;with family/visitor present;with chair alarm set   Nurse Communication Mobility status;Other (comment)(need for +2)        Time: 1430-1451 OT Time Calculation (min): 21 min  Charges: OT General Charges $OT Visit: 1 Visit OT Treatments $Self Care/Home Management : 8-22 mins  Lou Cal, OT Pager [phone] 03/11/2017    Raymondo Band 03/11/2017, 3:43 PM

## 2017-03-11 NOTE — Progress Notes (Signed)
IP PROGRESS NOTE  Subjective:  On behalf of the patient, family reports stable pain control overnight.  No progression of nausea or abdominal distention.  Minimal stool output over the past 24 hours.  Objective: Vital signs in last 24 hours: Blood pressure 120/77, pulse (!) 125, temperature 98.1 F (36.7 C), temperature source Oral, resp. rate 16, height 4' 10.5" (1.486 m), weight 112 lb (50.8 kg), SpO2 100 %.  Intake/Output from previous day: 02/11 0701 - 02/12 0700 In: 300 [P.O.:300] Out: -   Physical Exam: Sitting up in the chair.  Respirations appear to be comfortable, no outward signs of discomfort at this time.Marland Kitchen HEENT: Anicteric sclera, moist mucous membranes Lungs: Decreased breath sounds bilaterally with expiratory wheezing and frequent nonproductive cough Cardiac: S1/S2, regular, no murmurs Abdomen: Stable abdominal distention with diffuse tenderness  Lymph nodes: No palpable lymphadenopathy in the cervical, supraclavicular, axillary, or inguinal regions Neurologic: Unable to evaluate today Skin: No rash, petechiae, or ecchymosis. Musculoskeletal: Bilateral lower extremity swelling.   Lab Results: Recent Labs    03/10/17 1101 03/11/17 1010  WBC 27.6* 25.0*  HGB 11.4* 11.2*  HCT 37.1 36.7  PLT 214 264    BMET Recent Labs    03/10/17 1101 03/11/17 1010  NA 135 135  K 4.2 4.4  CL 90* 90*  CO2 29 32  GLUCOSE 159* 161*  BUN 20 24*  CREATININE 0.60 0.66  CALCIUM 8.1* 8.2*    Lab Results  Component Value Date   CEA1 0.9 02/19/2017    Studies/Results: No results found.  Medications: I have reviewed the patient's current medications.  Assessment/Plan: 67 year old female who presented with abdominal pain and partial bowel obstruction with findings of widely metastatic malignancy including peritoneal carcinomatosis, hepatic lesions, and multifocal bilateral pulmonary involvement.  Peritoneal fluid positive for presence of cells consistent with  adenocarcinoma.    **SBO, partial/Ileus due to opioids: Recent CT of the abdomen pelvis demonstrates persistent small bowel distention consistent with at least partial obstruction.  In addition to that,: Appears distended with retention of stool which is likely due to opioid medications.  Patient is passing minimal amounts of stool.  This is likely commensurate to the low oral intake that has been observed over the past several days.  No significant distention progression, no recurrent vomiting noted at this time to suggest worsening small bowel obstruction. --Unfortunately, cannot use methylnaltrexone due to presence of bowel obstruction. -Continue gentle laxatives   --Senna/Docusate + Psyllum + Miralax QDay --Continue Octreotide  **Sepsis/Delirium: Unclear if combination of severe pain, opioid medications, and possible infection combined to contribute to the presentation last night.  CT of the abdomen/pelvis demonstrates no evidence of free air in the peritoneum.  Essentially stable appearance of the known malignancy.  A possible foreign body is noted in the urethra.  Differential for that includes a nephrolith versus portion of the pessary.  No recurrent delirium so far.  Patient remains afebrile. --Will limit opioid medications to short-acting forms only apparently does not tolerate MS Contin that has been tried several times  **Respiratory failure mixed hypoxemic/hypercarbic: Likely due to combination of malignant infiltration of the lungs including lung infiltration by underlying malignancy including lymphangitic spread, pleural effusion secondary to the malignant ascites, and effects of significant levels of opioid medications delivered for pain control.  Seen and evaluated by pulmonology who is recommendations and input are greatly appreciated.  Bicarbonate level down to 36 today, likely due to diuresis with furosemide.  Echocardiogram was obtained 02/27/17 demonstrating preserved left ventricular  ejection fraction.  Weight is stable over the past several days, I do not think that the fluid retention is the current problem although patient's lower extremity swelling has increased since yesterday.   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  Nevertheless, distribution of the disease as well as elevation of CA 125 and normal CA19-9 with noncontributory immunohistochemical profile favor primary peritoneal or ovarian etiology. Received the initial chemotherapy with carboplatin AUC 5 + paclitaxel 125 mg/m Q21d on 02/21/17.  Repeat CT scan of the abdomen and pelvis demonstrates essentially stable findings.  Radiographic appearance is still very worrisome for possible primary gallbladder carcinoma based on the thickened gallbladder and appearance of direct hepatic invasion.  At this time, patient has received the first dose of systemic chemotherapy and no additional treatment will be administered until 03/14/17.  Between worsening bowel obstruction, rising leukocytosis, and lack of significant improvement in physical state of the patient, I am becoming less and less optimistic about the likely outcome of the cancer management strategy we have pursued. --Current plan as discussed with the family would be to continue monitoring of the patient's clinical state until Friday morning.  If lab work and clinical evaluation to time are permissive, we will proceed with the second cycle of carboplatin and paclitaxel with likely dose reduction by at least 20% for each drug.  After administration of chemotherapy, will consider discharging patient home.  **Recurrent malignant ascites: Due to underlying  malignancy.  Will reaccumulate rapidly until the cancer is addressed. Paracentesis 02/21/17, 02/24/17.  Persistent significant pain in the abdomen with increased bowel output.  Increasing pain is more likely to be associated with increased bowel mobility. --We will abstain from additional paracentesis at this point in time unless abdominal distention increases significantly.  **Cancer-associated pain: Patient seems to be able to stay awake enough at this current medication levels with no uncontrolled pain.  Pain control is reasonably stable.  I do not believe that the insomnia is due to Dilaudid at this time, but patient can use lorazepam for sleeping aid. --Current pain regimen:     --Hydromorphone 2 mg oral every 3 hours as needed  **Diabetes mellitus: As expected, glucose is higher and now that patient has received glucocorticoids with her chemotherapy. --Continue SSI for now, may have to increase to higher grade scale  **Malnutrition, combined calorie/protein, severe: Severe decrease in oral intake due to peritoneal carcinomatosis and partial bowel obstruction with pain exacerbated by food intake.  Patient has been seen by dietitian.  Phosphorus level has stabilized, albumin is slightly improved compared to the prior days. --Resume diet and advance as tolerated  **Electrolytes/Fluids:  Hyponatremia: Complex hyponatremia due to intravascular volume depletion, pain, nausea, metastatic disease in the liver.  Sodium has normalized  Hypokalemia: Potassium has dropped likely due to increased urine output and insufficient oral intake.  Replaced with IV KCl/KPhos, resolved without recurrence.  Improved potassium after replacement. --Continue furosemide 40 mg daily by mouth  **PPx:  --Continue pantoprazole for stress ulcer prophylaxis  --Continue enoxaparin 40 mg subcu daily for DVT prophylaxis  **Code Status:  --Full code: Previously, patient was DNR/DNI, but we had additional conversation  yesterday and patient insists on revival to be attempted in case of cardiac or respiratory  arrest.  Have explained that her likelihood of successful revival is negligible, likely less than 1% based on her extensive malignancy.  CODE STATUS reviewed again today outlining the poor prognosis in case of need for resuscitation.  Family continues to maintain for the patient to remain full code.  **Disposition:  - At this time, I think that patient has achieved as much as can be achieved within the confines of the hospitalization.  Some fine tuning is still possible.  Due to the patient's convenience, I would like to keep her hospitalized until Friday morning when we will make a decision on whether to proceed with the next cycle of the systemic chemotherapy.  If chemotherapy is administered, patient will likely be discharged home to care of the family with home health assistance.   LOS: 25 days   Ardath Sax, MD   03/11/2017, 8:08 PM

## 2017-03-11 NOTE — Plan of Care (Addendum)
Goals updated to reflect Pt progress.

## 2017-03-11 NOTE — Progress Notes (Signed)
Daily Progress Note   Patient Name: Jessica Gregory       Date: 03/11/2017 DOB: [birth year]  Age: 67 y.o. MRN#: [id number] Attending Physician: Ardath Sax, MD Primary Care Physician: Forrest Moron, MD Admit Date: 02/22/2017  Reason for Consultation/Follow-up: Establishing goals of care, Paint control, and Psychosocial/spiritual support   Subjective: Patient sitting up in chair and daughter is at bedside. Daughter reports her mom has been sleeping since last evening.   Her appetite remains poor and oral intake remains poor, see below  Length of Stay: 25  Current Medications: Scheduled Meds:  . Chlorhexidine Gluconate Cloth  6 each Topical Q0600  . enoxaparin (LOVENOX) injection  40 mg Subcutaneous Q24H  . fluconazole  100 mg Oral Daily  . furosemide  40 mg Oral Daily  . insulin aspart  0-9 Units Subcutaneous TID WC  . multivitamin  15 mL Oral Daily  . octreotide  100 mcg Subcutaneous Q8H  . pantoprazole  40 mg Oral Daily  . polyethylene glycol  17 g Oral Daily  . protein supplement  8 oz Oral BID  . psyllium  1 packet Oral Daily  . senna-docusate  1 tablet Oral BID  . simethicone  40 mg Oral QID  . sodium chloride flush  10-40 mL Intracatheter Q12H    Continuous Infusions:   PRN Meds: guaiFENesin-dextromethorphan, HYDROmorphone, levalbuterol, LORazepam, prochlorperazine, sodium chloride flush, sodium chloride flush, sodium chloride flush  Physical Exam  Constitutional: She appears cachectic. She appears ill.  Musculoskeletal:       Right shoulder: She exhibits decreased strength.  Generalized weakness   Neurological:  Sleeping.  Arouses but immediately falls back to sleep.  No edema         Vital Signs: BP 120/77 (BP Location: Right Arm)   Pulse (!) 125   Temp  98.1 F (36.7 C) (Oral)   Resp 16   Ht 4' 10.5" (1.486 m)   Wt 50.8 kg (112 lb)   SpO2 100%   BMI 23.01 kg/m  SpO2: SpO2: 100 % O2 Device: O2 Device: Nasal Cannula O2 Flow Rate: O2 Flow Rate (L/min): 3 L/min  Intake/output summary:   Intake/Output Summary (Last 24 hours) at 03/11/2017 0957 Last data filed at 03/11/2017 0600 Gross per 24 hour  Intake 300 ml  Output -  Net 300  ml   LBM: Last BM Date: 03/10/17 Baseline Weight: Weight: 55.2 kg (121 lb 11.1 oz) Most recent weight: Weight: 50.8 kg (112 lb)       Palliative Assessment/Data: 30%      Patient Active Problem List   Diagnosis Date Noted  . SBO (small bowel obstruction) ([place])   . Constipation 03/03/2017  . Fever   . Abdominal distention   . Acute delirium   . Hyponatremia   . Acute respiratory failure ([place]) 02/26/2017  . Electrolyte imbalance 02/26/2017  . Hypokalemia 02/25/2017  . Intra-abdominal malignant neoplasm ([place])   . Ascites   . SOB (shortness of breath)   . Cancer associated pain 02/21/2017  . Nausea   . Admission for chemotherapy   . Peritoneal carcinomatosis ([place])   . Metastatic cancer ([place])   . Sepsis ([place]) 02/14/2017  . Diffuse papular rash 08/01/2016  . Psoriasis 08/01/2016  . Chronic hepatitis B ([place]) 08/01/2016  . Hepatitis, viral 03/01/2015  . Controlled type 2 diabetes mellitus without complication, without long-term current use of insulin ([place]) 03/01/2015  . Uncontrolled diabetes mellitus ([place]) 11/21/2014  . Language barrier 11/16/2014    Palliative Care Assessment & Plan   Patient Profile: 67 y.o. 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 Patient has had abdominal discomfort for past couple of months and underwent CT scanning at urgent care, which showed concerns for widespread metastatic disease. According to the daughter patient has not  been seeing any physician and was diagnosed with diabetes mellitus type 2 about 6 months ago and has been on metformin.  No other complaints including nausea, vomiting, unintentional weight loss.  She has had poor appetite over the last 2 weeks and attributes it to less desire to eat.  Previously patient has not had any screening procedures/imaging such as colonoscopy or mammography.  Denies any family history of malignancies.    Assessment:  SBO Sepsis/delirium multi factorial. pulm mets  Possible GB malignancy or Possible ovarian malignancy Recurrent malignant ascites.  Uncontrolled multi factorial pain.  Cancer cachexia/anorexia  Severe calorie/protein malnutrition  Recommendations/Plan:  Remains FULL code    Continue prn Dilaudid 2-4mg  PO for pain control  Patient will need 24 hour supervision at disposition   Will continue to follow and support patient and family during hospitalization.  Reviewed clinical course over the past few months and provided supportive listening to daughter.  As this was my first encouter, much of the time was spent building rapport and discussing family's understanding of situation.  Will continue to progress conversation regarding goals as family is emotionally willing/able to do so.  Goals of Care and Additional Recommendations:  Limitations on Scope of Treatment: Full Scope Treatment  Code Status:    Code Status Orders  (From admission, onward)        Start     Ordered   02/27/17 1315  Full code  Continuous     02/27/17 1314    Code Status History    Date Active Date Inactive Code Status Order ID Comments User Context   02/26/2017 08:05 02/27/2017 13:14 DNR [id number]  Ardath Sax, MD Inpatient   02/14/2017 00:35 02/26/2017 08:05 Full Code [id number]  Damita Lack, MD ED       Prognosis:  Guarded with poor prognosis given widespread malignancy   Discharge Planning:  To Be Determined  Care plan was discussed with  daughter.  Thank you for allowing the Palliative Medicine Team to assist in the care of this patient.   Total Time 30 min Prolonged Time Billed  NO       Greater than 50%  of this time was spent counseling and coordinating care related to the above assessment and plan.  Please contact Palliative Medicine Team phone at [phone] for questions and concerns.

## 2017-03-12 ENCOUNTER — Inpatient Hospital Stay (HOSPITAL_COMMUNITY): Payer: 59

## 2017-03-12 LAB — GLUCOSE, CAPILLARY
GLUCOSE-CAPILLARY: 158 mg/dL — AB (ref 65–99)
GLUCOSE-CAPILLARY: 102 mg/dL — AB (ref 65–99)
GLUCOSE-CAPILLARY: 125 mg/dL — AB (ref 65–99)
GLUCOSE-CAPILLARY: 129 mg/dL — AB (ref 65–99)
Glucose-Capillary: 147 mg/dL — ABNORMAL HIGH (ref 65–99)
Glucose-Capillary: 97 mg/dL (ref 65–99)
Glucose-Capillary: 134 mg/dL — ABNORMAL HIGH (ref 65–99)
Glucose-Capillary: 68 mg/dL (ref 65–99)

## 2017-03-12 LAB — COMPREHENSIVE METABOLIC PANEL
ALK PHOS: 88 U/L (ref 38–126)
ALT: 26 U/L (ref 14–54)
ANION GAP: 14 (ref 5–15)
AST: 25 U/L (ref 15–41)
Albumin: 2.4 g/dL — ABNORMAL LOW (ref 3.5–5.0)
BUN: 26 mg/dL — ABNORMAL HIGH (ref 6–20)
CALCIUM: 8.5 mg/dL — AB (ref 8.9–10.3)
CHLORIDE: 91 mmol/L — AB (ref 101–111)
CO2: 32 mmol/L (ref 22–32)
CREATININE: 0.69 mg/dL (ref 0.44–1.00)
Glucose, Bld: 121 mg/dL — ABNORMAL HIGH (ref 65–99)
Potassium: 4.5 mmol/L (ref 3.5–5.1)
Sodium: 137 mmol/L (ref 135–145)
Total Bilirubin: 0.7 mg/dL (ref 0.3–1.2)
Total Protein: 5.9 g/dL — ABNORMAL LOW (ref 6.5–8.1)

## 2017-03-12 LAB — CBC WITH DIFFERENTIAL/PLATELET
BASOS ABS: 0 10*3/uL (ref 0.0–0.1)
BASOS PCT: 0 %
Eosinophils Absolute: 0 10*3/uL (ref 0.0–0.7)
Eosinophils Relative: 0 %
HCT: 36.1 % (ref 36.0–46.0)
Hemoglobin: 10.8 g/dL — ABNORMAL LOW (ref 12.0–15.0)
Lymphocytes Relative: 4 %
Lymphs Abs: 1.1 10*3/uL (ref 0.7–4.0)
MCH: 25.8 pg — ABNORMAL LOW (ref 26.0–34.0)
MCHC: 29.9 g/dL — ABNORMAL LOW (ref 30.0–36.0)
MCV: 86.2 fL (ref 78.0–100.0)
MONO ABS: 1.1 10*3/uL — AB (ref 0.1–1.0)
Monocytes Relative: 4 %
Neutro Abs: 22.4 10*3/uL — ABNORMAL HIGH (ref 1.7–7.7)
Neutrophils Relative %: 92 %
PLATELETS: 248 10*3/uL (ref 150–400)
RBC: 4.19 MIL/uL (ref 3.87–5.11)
RDW: 15.4 % (ref 11.5–15.5)
WBC: 24.6 10*3/uL — ABNORMAL HIGH (ref 4.0–10.5)

## 2017-03-12 LAB — MAGNESIUM: MAGNESIUM: 1.9 mg/dL (ref 1.7–2.4)

## 2017-03-12 LAB — PHOSPHORUS: PHOSPHORUS: 3.5 mg/dL (ref 2.5–4.6)

## 2017-03-12 NOTE — Progress Notes (Addendum)
Physical Therapy Treatment Patient Details Name: Jessica Gregory MRN: [id number] DOB: Aug 07, 1950 Today's Date: 03/12/2017    History of Present Illness 67 y.o. female with medical history significant of history of diabetes mellitus type 2, hyperlipidemia was sent to the ER for further evaluation due to abnormal CT scan and and patient feeling generally ill.  Patient has had abdominal discomfort for past couple of months. 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 Has had 1 diagnostic paracentsis and one pallaitive paracentesis thus far    PT Comments    Patient with limited activity tolerance this session.  More lethargic initially with daughter reporting pt sleeping all day and had just assisted her up to chair.  Feel pt may continue to be appropriate for skilled PT to progress mobility for d/c home with family assist.  Updated recommendation for now home with HHPT follow up.   Follow Up Recommendations  Supervision/Assistance - 24 hour;Home health PT     Equipment Recommendations  Rolling walker with 5" wheels;Wheelchair (measurements PT)    Recommendations for Other Services       Precautions / Restrictions Precautions Precautions: Fall Precaution Comments: watch O2 and HR     Mobility  Bed Mobility               General bed mobility comments: up in chair  Transfers Overall transfer level: Needs assistance Equipment used: Rolling walker (2 wheeled) Transfers: Sit to/from Stand Sit to Stand: Mod assist         General transfer comment: assist to boost into standing  Ambulation/Gait Ambulation/Gait assistance: Min assist;+2 safety/equipment Ambulation Distance (Feet): 60 Feet   Gait Pattern/deviations: Decreased stride length;Shuffle;Step-to pattern;Step-through pattern     General Gait Details: slow pace, HR 130 with ambulation SpO2 87% on 4L O2; pt with bilateral knee buckling and  lowered to floor, assisted up to computer chair in hallway with NT and she took vitals and assist to room to [facility] and to recliner with mod A with RW   Stairs            Wheelchair Mobility    Modified Rankin (Stroke Patients Only)       Balance Overall balance assessment: Needs assistance;History of Falls Sitting-balance support: Feet supported Sitting balance-Leahy Scale: Fair Sitting balance - Comments: limited due to lethargy this session   Standing balance support: Bilateral upper extremity supported Standing balance-Leahy Scale: Poor Standing balance comment: UE support for balance and assist                            Cognition Arousal/Alertness: Lethargic Behavior During Therapy: Flat affect Overall Cognitive Status: Impaired/Different from baseline                                 General Comments: very slow to respond, increased time for all mobility      Exercises      General Comments General comments (skin integrity, edema, etc.): daughter present and assisting wtih O2 tank in hallway, when pt lowered to floor attempted to lift up, but pt unable to help so NT over to assist      Pertinent Vitals/Pain Pain Assessment: No/denies pain    Home Living                      Prior Function  PT Goals (current goals can now be found in the care plan section) Progress towards PT goals: Not progressing toward goals - comment(due to lethargy, weakness)    Frequency    Min 3X/week      PT Plan Discharge plan needs to be updated    Co-evaluation              AM-PAC PT "6 Clicks" Daily Activity  Outcome Measure  Difficulty turning over in bed (including adjusting bedclothes, sheets and blankets)?: A Lot Difficulty moving from lying on back to sitting on the side of the bed? : A Lot Difficulty sitting down on and standing up from a chair with arms (e.g., wheelchair, bedside commode, etc,.)?: A  Lot Help needed moving to and from a bed to chair (including a wheelchair)?: A Lot Help needed walking in hospital room?: A Little Help needed climbing 3-5 steps with a railing? : A Lot 6 Click Score: 13    End of Session Equipment Utilized During Treatment: Gait belt;Oxygen Activity Tolerance: Patient limited by fatigue;Treatment limited secondary to medical complications (Comment)(elevated HR and lower SpO2) Patient left: with call bell/phone within reach;with family/visitor present;with chair alarm set;in chair   PT Visit Diagnosis: Other abnormalities of gait and mobility (R26.89);Muscle weakness (generalized) (M62.81)     Time: 1350-1410 PT Time Calculation (min) (ACUTE ONLY): 20 min  Charges:  $Gait Training: 8-22 mins                    G CodesMagda Kiel, [place] [phone] 03/12/2017    [name] 03/12/2017, 4:29 PM

## 2017-03-12 NOTE — Care Management Note (Signed)
Case Management Note  Patient Details  Name: Roselie Royall MRN: [id number] Date of Birth: 04/04/1950  Subjective/Objective:   67 yo admitted with Sepsis. Hx of adenocarcinoma with mets                 Action/Plan: From home with daughter. This CM met with pt and daughter at bedside. Daughter offered choice for home health services and [facility] chosen. Daughter also requesting RW and 3in1. She would like to think about the hospital bed at this time. [facility] rep alerted of referral and need for home DME. CM will continue to follow and assist as needed.  Expected Discharge Date:                  Expected Discharge Plan:  Home w Home Health Services  In-House Referral:     Discharge planning Services  CM Consult  Post Acute Care Choice:  Home Health Choice offered to:  Adult Children  DME Arranged:  3-N-1, Walker rolling DME Agency:  [facility]  HH Arranged:  RN, Nurse's Aide HH Agency:  [facility]  Status of Service:  In process, will continue to follow  If discussed at Long Length of Stay Meetings, dates discussed:    Additional Comments:  CLEMENTS, NORA H, RN 03/12/2017, 1:23 PM [phone] 

## 2017-03-12 NOTE — Progress Notes (Signed)
IP PROGRESS NOTE  Subjective:  For the past 24 hours, patient had better sleep, rested the whole day yesterday.  Now awake again, and feeling better.  No nausea or vomiting.  Continues to have moderate abdominal discomfort well controlled on the current pain medications.  Several small bowel movements yesterday.  Objective: Vital signs in last 24 hours: Blood pressure 126/84, pulse (!) 120, temperature 97.6 F (36.4 C), temperature source Oral, resp. rate (!) 26, height 4' 10.5" (1.486 m), weight 112 lb (50.8 kg), SpO2 93 %.  Intake/Output from previous day: No intake/output data recorded.  Physical Exam: Sitting up in the chair.  Respirations appear to be comfortable, no outward signs of discomfort at this time.Marland Kitchen HEENT: Anicteric sclera, moist mucous membranes Lungs: Decreased breath sounds bilaterally with expiratory wheezing and frequent nonproductive cough Cardiac: S1/S2, regular, no murmurs Abdomen: Stable abdominal distention with diffuse tenderness  Lymph nodes: No palpable lymphadenopathy in the cervical, supraclavicular, axillary, or inguinal regions Neurologic: Unable to evaluate today Skin: No rash, petechiae, or ecchymosis. Musculoskeletal: Bilateral lower extremity swelling.   Lab Results: Recent Labs    03/11/17 1010 03/12/17 0500  WBC 25.0* 24.6*  HGB 11.2* 10.8*  HCT 36.7 36.1  PLT 264 248    BMET Recent Labs    03/11/17 1010 03/12/17 0500  NA 135 137  K 4.4 4.5  CL 90* 91*  CO2 32 32  GLUCOSE 161* 121*  BUN 24* 26*  CREATININE 0.66 0.69  CALCIUM 8.2* 8.5*    Lab Results  Component Value Date   CEA1 0.9 02/19/2017    Studies/Results: No results found.  Medications: I have reviewed the patient's current medications.  Assessment/Plan: 67 year old female who presented with abdominal pain and partial bowel obstruction with findings of widely metastatic malignancy including peritoneal carcinomatosis, hepatic lesions, and multifocal bilateral  pulmonary involvement.  Peritoneal fluid positive for presence of cells consistent with adenocarcinoma.    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  Nevertheless, distribution of the disease as well as elevation of CA 125 and normal CA19-9 with noncontributory immunohistochemical profile favor primary peritoneal or ovarian etiology. Received the initial chemotherapy with carboplatin AUC 5 + paclitaxel 125 mg/m Q21d on 02/21/17.  Repeat CT scan of the abdomen and pelvis demonstrates essentially stable findings.  Radiographic appearance is still very worrisome for possible primary gallbladder carcinoma based on the thickened gallbladder and appearance of direct hepatic invasion.  At this time, patient has received the first dose of systemic chemotherapy and no additional treatment will be administered until 03/14/17.  Between worsening bowel obstruction, rising leukocytosis, and lack of significant improvement in physical state of the patient, I am becoming less and less optimistic about the likely outcome of the cancer management strategy we have pursued, nevertheless, patient and her family maintain interest in pursuing further systemic therapy. --We will proceed with inpatient systemic therapy on 03/14/17 with carboplatin and paclitaxel.  If patient is resuming her daily ambulation, no dose reduction will be undertaken.  If her activity level is continues to remain decreased, 20% dose reduction will be conducted..  **SBO, partial/Ileus due to opioids: Recent CT of the abdomen pelvis demonstrates persistent small bowel distention consistent with at least partial obstruction.  In addition to that,: Appears distended with retention of stool which is likely due to opioid medications.  Patient started moving her bowels again yesterday.  No  recurrence of  vomiting suggesting improvement in the bowel function. --Continue gentle oral nutrition.  We will reserve initiation of TPN only for development of complete bowel obstruction if suctioning of the gastric contents is necessary. --Unfortunately, cannot use methylnaltrexone due to presence of bowel obstruction. -Continue gentle laxatives   --Senna/Docusate + Psyllum + Miralax QDay --Continue Octreotide  **Recurrent malignant ascites: Due to underlying malignancy.  Will reaccumulate rapidly until the cancer is addressed. Paracentesis 02/21/17, 02/24/17.  Persistent significant pain in the abdomen with increased bowel output.  No recurrence of significant ascites noted at this time.  Most of the abdominal distention is due to the partial bowel obstruction. --We will abstain from additional paracentesis at this point in time unless abdominal distention increases significantly.  **Cancer-associated pain: Patient seems to be able to stay awake enough at this current medication levels with no uncontrolled pain.  Pain control is reasonably stable.  I do not believe that the insomnia is due to Dilaudid at this time, but patient can use lorazepam for sleeping aid. --Current pain regimen:     --Hydromorphone 2-4 mg oral every 3 hours as needed --Continue current regimen unchanged  **Diabetes mellitus: As expected, glucose is higher and now that patient has received glucocorticoids with her chemotherapy. --Continue SSI for now, may have to increase to higher grade scale  **Malnutrition, combined calorie/protein, severe: Severe decrease in oral intake due to peritoneal carcinomatosis and partial bowel obstruction with pain exacerbated by food intake.  Patient has been seen by dietitian.  Phosphorus level has stabilized, albumin is slightly improved compared to the prior days. --Continue diet and advance as tolerated  **Electrolytes/Fluids:  Hyponatremia: Complex hyponatremia due to intravascular  volume depletion, pain, nausea, metastatic disease in the liver.  Sodium has normalized  Hypokalemia: Potassium has dropped likely due to increased urine output and insufficient oral intake.  Replaced with IV KCl/KPhos, resolved without recurrence.  Improved potassium after replacement. --Continue furosemide 40 mg daily by mouth  **PPx:  --Continue pantoprazole for stress ulcer prophylaxis  --Continue enoxaparin 40 mg subcu daily for DVT prophylaxis  **Code Status:  --Full code: Previously, patient was DNR/DNI, but we had additional conversation yesterday and patient insists on revival to be attempted in case of cardiac or respiratory arrest.  Have explained that her likelihood of successful revival is negligible, likely less than 1% based on her extensive malignancy.  CODE STATUS reviewed again today outlining the poor prognosis in case of need for resuscitation.  Family continues to maintain for the patient to remain full code.  **Disposition:  -Currently, plan is to administer the second cycle of systemic chemotherapy on 03/14/17 with possible discharge the same day or on Monday.   LOS: 26 days   Ardath Sax, MD   03/12/2017, 10:24 AM

## 2017-03-12 NOTE — Progress Notes (Signed)
Daily Progress Note   Patient Name: Jessica Gregory       Date: 03/12/2017 DOB: 01-Jun-1950  Age: 67 y.o. MRN#: [id number] Attending Physician: Ardath Sax, MD Primary Care Physician: Forrest Moron, MD Admit Date: 02/10/2017  Reason for Consultation/Follow-up: Establishing goals of care, Non pain symptom management, Pain control and Psychosocial/spiritual support  Subjective: Patient is resting in bed with daughter at bedside. Daughter reports her mother did get up in the chair earlier and walked some. Daughter reports pain being controlled on Dilaudid PO. States she slept most of the day yesterday. She also slept during the night last night without PRN dose of Ativan.   Her appetite remains poor and oral intake remains poor. See below.   Length of Stay: 26  Current Medications: Scheduled Meds:  . Chlorhexidine Gluconate Cloth  6 each Topical Q0600  . enoxaparin (LOVENOX) injection  40 mg Subcutaneous Q24H  . fluconazole  100 mg Oral Daily  . furosemide  40 mg Oral Daily  . insulin aspart  0-9 Units Subcutaneous TID WC  . multivitamin  15 mL Oral Daily  . octreotide  100 mcg Subcutaneous Q8H  . pantoprazole  40 mg Oral Daily  . polyethylene glycol  17 g Oral Daily  . protein supplement  8 oz Oral BID  . psyllium  1 packet Oral Daily  . senna-docusate  1 tablet Oral BID  . simethicone  40 mg Oral QID  . sodium chloride flush  10-40 mL Intracatheter Q12H    Continuous Infusions:   PRN Meds: guaiFENesin-dextromethorphan, HYDROmorphone, levalbuterol, LORazepam, prochlorperazine, sodium chloride flush, sodium chloride flush, sodium chloride flush  Physical Exam        Constitutional: She appears cachectic. She appears ill.  Musculoskeletal:  She exhibits decreased strength.    Generalized weakness   Neurological:  Sleeping.  Arouses but immediately falls back to sleep.  No edema     Vital Signs: BP 126/84 (BP Location: Right Arm)   Pulse (!) 120   Temp 97.6 F (36.4 C) (Oral)   Resp (!) 26   Ht 4' 10.5" (1.486 m)   Wt 50.8 kg (112 lb)   SpO2 93% Comment: patient walked in the hallway and SpO2 recorded after  BMI 23.01 kg/m  SpO2: SpO2: 93 %(patient walked in the hallway and SpO2  recorded after) O2 Device: O2 Device: Nasal Cannula O2 Flow Rate: O2 Flow Rate (L/min): 3 L/min  Intake/output summary:   Intake/Output Summary (Last 24 hours) at 03/12/2017 1510 Last data filed at 03/12/2017 3254 Gross per 24 hour  Intake 120 ml  Output -  Net 120 ml   LBM: Last BM Date: 03/10/17 Baseline Weight: Weight: 55.2 kg (121 lb 11.1 oz) Most recent weight: Weight: 50.8 kg (112 lb)       Palliative Assessment/Data: PPS 30%   Flowsheet Rows     Most Recent Value  Intake Tab  Referral Department  Oncology  Palliative Care Primary Diagnosis  Cancer  Date Notified  03/03/17  Palliative Care Type  New Palliative care  Reason for referral  Pain, Clarify Goals of Care  Date of Admission  02/04/2017  Date first seen by Palliative Care  03/04/17  # of days Palliative referral response time  1 Day(s)  # of days IP prior to Palliative referral  18  Clinical Assessment  Psychosocial & Spiritual Assessment  Palliative Care Outcomes      Patient Active Problem List   Diagnosis Date Noted  . SBO (small bowel obstruction) ([place])   . Constipation 03/03/2017  . Fever   . Abdominal distention   . Acute delirium   . Hyponatremia   . Acute respiratory failure ([place]) 02/26/2017  . Electrolyte imbalance 02/26/2017  . Hypokalemia 02/25/2017  . 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 Ascites   . SOB (shortness of breath)   . Cancer associated pain 02/21/2017  . Nausea   . Admission for chemotherapy   . Peritoneal carcinomatosis ([place])   . Metastatic cancer  ([place])   . Sepsis ([place]) 02/14/2017  . Diffuse papular rash 08/01/2016  . Psoriasis 08/01/2016  . Chronic hepatitis B ([place]) 08/01/2016  . Hepatitis, viral 03/01/2015  . Controlled type 2 diabetes mellitus without complication, without long-term current use of insulin ([place]) 03/01/2015  . Uncontrolled diabetes mellitus ([place]) 11/21/2014  . 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 Patient has had abdominal discomfort for past couple of months and underwent CT scanning at urgent care, which showed concerns for widespread metastatic disease. According to the daughter patient has not been seeing any physician and was diagnosed with diabetes mellitus type 2 about 6 months ago and has been on metformin. No other complaints including nausea, vomiting, unintentional weight loss.She has had poor appetite over the last 2 weeks and attributes it to less desire to eat. Previously patient has not had any screening procedures/imaging such as colonoscopy or mammography. Denies any family history of malignancies.   Assessment: SBO Sepsis/delirium multi factorial. pulm mets  Possible GB malignancy or Possible ovarian malignancy Recurrent malignant ascites.  Uncontrolled multi factorial pain.  Cancer cachexia/anorexia  Severe calorie/protein malnutrition   Recommendations/Plan:  Remains FULL code    Continue prn Dilaudid 2-89m PO for pain control, daughter would like to try 240mPO during the day to see if her mom is more alert and functional, and only use 5m78mO at bedtime or with severe pain if needed. Nurse made aware of this request.   Patient will need 24 hour supervision at disposition   Will continue to follow and support patient and family during  hospitalization.  Daughter is questioning today if restarting chemotherapy on  2/15 would be appropriate for her mother given that she is so weak and deconditioned. Advised this can be revisited prior to chemotherapy being restarted and per Dr. Clydene Laming recommendations. Instructed daughters that providers will continue to monitor her status and go day by day.   Will continue to progress conversation regarding goals as family is emotionally willing/able to do so.  Goals of Care and Additional Recommendations:  Limitations on Scope of Treatment: Full Scope Treatment  Code Status:    Code Status Orders  (From admission, onward)        Start     Ordered   02/27/17 1315  Full code  Continuous     02/27/17 1314    Code Status History    Date Active Date Inactive Code Status Order ID Comments User Context   02/26/2017 08:05 02/27/2017 13:14 DNR [id number]  Ardath Sax, MD Inpatient   02/14/2017 00:35 02/26/2017 08:05 Full Code [id number]  Damita Lack, MD ED      Prognosis:   Guarded with poor prognosis given widespread malignancy   Discharge Planning:  To Be Determined    Care plan was discussed with daughter and bedside nurse.   Thank you for allowing the Palliative Medicine Team to assist in the care of this patient.   Total Time 30 min. Prolonged Time Billed  No       Greater than 50%  of this time was spent counseling and coordinating care related to the above assessment and plan.  Alda Lea, AGNP-C Palliative Medicine Team  Phone: [phone] Fax: [phone]   Please contact Palliative Medicine Team phone at [phone] for questions and concerns.    Addendum: Met with patient and daughter in conjunction with [employer], AGNP-C.  Daughter's concerns are: Sleepiness- plan for trial of lower dose of dilaudid throughout the day, although I do not think that this is sole cause of her being sleepy. She reports wondering if she is  going to be strong enough for chemotherapy.  Discussed plan to continue daily monitoring with Dr. Lebron Conners to make determination if additional disease modifying therapy is likely to be beneficial.  On exam: Constitutional: She appears cachectic. She appears ill.  Musculoskeletal:       Right shoulder: She exhibits decreased strength.  Generalized weakness   Neurological:  Sleeping.  Arouses but immediately falls back to sleep.  No edema   I have reviewed and discussed the care of this patient in detail with the nurse practitioner including pertinent patient records, physical exam findings and data. I agree with details of this encounter.  Micheline Rough, MD [facility] [phone]

## 2017-03-13 DIAGNOSIS — R400 Somnolence: Secondary | ICD-10-CM

## 2017-03-13 DIAGNOSIS — E43 Unspecified severe protein-calorie malnutrition: Secondary | ICD-10-CM

## 2017-03-13 LAB — GLUCOSE, CAPILLARY
GLUCOSE-CAPILLARY: 165 mg/dL — AB (ref 65–99)
Glucose-Capillary: 109 mg/dL — ABNORMAL HIGH (ref 65–99)
Glucose-Capillary: 152 mg/dL — ABNORMAL HIGH (ref 65–99)

## 2017-03-13 MED ORDER — ELDERTONIC PO ELIX
15.0000 mL | ORAL_SOLUTION | Freq: Every day | ORAL | Status: DC
  Administered 2017-03-14 – 2017-03-17 (×4): 15 mL via ORAL
  Filled 2017-03-13 (×5): qty 15

## 2017-03-13 MED ORDER — FUROSEMIDE 10 MG/ML IJ SOLN
20.0000 mg | Freq: Every day | INTRAMUSCULAR | Status: DC
  Administered 2017-03-13 – 2017-03-14 (×2): 20 mg via INTRAVENOUS
  Filled 2017-03-13 (×3): qty 2

## 2017-03-13 MED ORDER — INSULIN ASPART 100 UNIT/ML ~~LOC~~ SOLN
0.0000 [IU] | SUBCUTANEOUS | Status: DC
  Administered 2017-03-13 – 2017-03-14 (×3): 2 [IU] via SUBCUTANEOUS
  Administered 2017-03-14: 3 [IU] via SUBCUTANEOUS
  Administered 2017-03-15 (×2): 2 [IU] via SUBCUTANEOUS

## 2017-03-13 MED ORDER — FAT EMULSION 20 % IV EMUL
120.0000 mL | INTRAVENOUS | Status: DC
  Administered 2017-03-13: 120 mL via INTRAVENOUS
  Filled 2017-03-13: qty 250

## 2017-03-13 MED ORDER — CLINIMIX E/DEXTROSE (5/15) 5 % IV SOLN
INTRAVENOUS | Status: DC
  Administered 2017-03-13: 18:00:00 via INTRAVENOUS
  Filled 2017-03-13: qty 720

## 2017-03-13 MED ORDER — MAGNESIUM SULFATE IN D5W 1-5 GM/100ML-% IV SOLN
1.0000 g | Freq: Once | INTRAVENOUS | Status: AC
  Administered 2017-03-13: 1 g via INTRAVENOUS
  Filled 2017-03-13: qty 100

## 2017-03-13 MED ORDER — FAT EMULSION 20 % IV EMUL: 120.0000 mL | INTRAVENOUS | Status: DC

## 2017-03-13 MED ORDER — CLINIMIX E/DEXTROSE (5/15) 5 % IV SOLN: INTRAVENOUS | Status: DC

## 2017-03-13 NOTE — Progress Notes (Signed)
Pt has been sitting in a chair most  of the day. She is hard to arouse. She is back in her bed on her  Left side .  BS 152  BP 166/66 o2 sat 100 HR 113.Resp 16. She has not voided today Weight change:  Plan to do a bladder scan. 564 ml per bladder scn . Dr Lebron Conners  Will be paged.  Order received for an indwelling catheter. Pt didn't even twitch when the foley was put in or when she was given  The subq medication. Urine is a dark amber color. Pt's facial color is dusky. I have paged DR Vista Lawman

## 2017-03-13 NOTE — Progress Notes (Signed)
IP PROGRESS NOTE  Subjective:  Past 24 hours, patient has been more somnolent than previously, less interactive.  Attempts to ambulate today were limited by significant amount of weakness.  Patient has been using bedside commode and still requires significant amount of assistance getting up and about.  Eating minimal amount of food, almost no stool output and according to the nursing, no urine output over the past 2 shifts.  Bladder scan obtained demonstrated 600 mL of urine retained in the bladder, Foley catheter was placed.  Objective: Vital signs in last 24 hours: Blood pressure 112/69, pulse (!) 111, temperature 97.8 F (36.6 C), temperature source Axillary, resp. rate (!) 8, height 4' 10.5" (1.486 m), weight 112 lb (50.8 kg), SpO2 100 %.  Intake/Output from previous day: 02/13 0701 - 02/14 0700 In: 120 [P.O.:120] Out: 100 [Urine:100]  Physical Exam: Sitting up in the chair.  Respirations appear to be comfortable, no outward signs of discomfort at this time, but patient is very somnolent compared to the previous days. HEENT: Anicteric sclera, moist mucous membranes Lungs: Decreased breath sounds bilaterally with expiratory wheezing and frequent nonproductive cough Cardiac: S1/S2, regular, no murmurs Abdomen: Stable abdominal distention with diffuse tenderness  Lymph nodes: No palpable lymphadenopathy in the cervical, supraclavicular, axillary, or inguinal regions Neurologic: Unable to evaluate today Skin: No rash, petechiae, or ecchymosis. Musculoskeletal: Bilateral lower extremity swelling.   Lab Results: Recent Labs    03/11/17 1010 03/12/17 0500  WBC 25.0* 24.6*  HGB 11.2* 10.8*  HCT 36.7 36.1  PLT 264 248    BMET Recent Labs    03/11/17 1010 03/12/17 0500  NA 135 137  K 4.4 4.5  CL 90* 91*  CO2 32 32  GLUCOSE 161* 121*  BUN 24* 26*  CREATININE 0.66 0.69  CALCIUM 8.2* 8.5*    Lab Results  Component Value Date   CEA1 0.9 02/19/2017     Studies/Results: Dg Chest 2 View  Result Date: 03/12/2017 CLINICAL DATA:  Follow-up pleural effusion, shortness of breath, weakness, history diabetes mellitus, metastatic cancer/peritoneal carcinomatosis primary tumor not specified EXAM: CHEST  2 VIEW COMPARISON:  03/02/2017 FINDINGS: LEFT arm PICC line tip projects over cavoatrial junction. Enlargement of cardiac silhouette with vascular congestion. Diffuse pulmonary infiltrates consistent with pulmonary edema. Bibasilar effusions atelectasis, slightly increased. No pneumothorax. Bones demineralized. IMPRESSION: Increased pulmonary edema, bibasilar pleural effusions and bibasilar atelectasis. Electronically Signed   By: Lavonia Dana M.D.   On: 03/12/2017 11:22    Medications: I have reviewed the patient's current medications.  Assessment/Plan: 67 year old female who presented with abdominal pain and partial bowel obstruction with findings of widely metastatic malignancy including peritoneal carcinomatosis, hepatic lesions, and multifocal bilateral pulmonary involvement.  Peritoneal fluid positive for presence of cells consistent with adenocarcinoma.    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  Nevertheless, distribution of the disease as well as elevation of CA 125 and normal CA19-9 with noncontributory immunohistochemical profile favor primary peritoneal or ovarian etiology. Received the initial chemotherapy with carboplatin AUC 5 + paclitaxel 125 mg/m Q21d on 02/21/17.  Repeat CT scan of the abdomen and pelvis demonstrates essentially stable findings.  Radiographic appearance is still very worrisome for possible primary gallbladder carcinoma based on the thickened gallbladder and appearance of direct hepatic invasion.  At this time, patient has received the first dose of systemic  chemotherapy and no additional treatment will be administered until 03/14/17.  Between worsening bowel obstruction, rising leukocytosis, and lack of significant improvement in physical state of the patient, I am becoming less and less optimistic about the likely outcome of the cancer management strategy we have pursued, nevertheless, patient and her family maintain interest in pursuing further systemic therapy. --Due to progressive decline in the performance status, we will not with chemotherapy tomorrow.  Will delay at least till Monday of next week.  **SBO, partial/Ileus due to opioids/severe malnutrition: Recent CT of the abdomen pelvis demonstrates persistent small bowel distention consistent with at least partial obstruction.  In addition to that,: Appears distended with retention of stool which is likely due to opioid medications.  Patient started moving her bowels again yesterday.  No recurrence of vomiting suggesting improvement in the bowel function.  Acute urinary retention likely due to increased opioid dosing yesterday.  Foley catheter placed. --Continue gentle oral nutrition.  We will reserve initiation of TPN only for development of complete bowel obstruction if suctioning of the gastric contents is necessary. --Unfortunately, cannot use methylnaltrexone due to presence of bowel obstruction. -Continue gentle laxatives   --Senna/Docusate + Psyllum + Miralax QDay --Continue Octreotide --Will be starting TPN on the patient today.  Oral intake has been steadily declining over the past several days and without additional nutritional support, she is unlikely to survive very much longer.  **Recurrent malignant ascites: Due to underlying malignancy.  Will reaccumulate rapidly until the cancer is addressed. Paracentesis 02/21/17, 02/24/17.  Persistent significant pain in the abdomen with increased bowel output.  No recurrence of significant ascites noted at this time.  Most of the abdominal  distention is due to the partial bowel obstruction. --We will abstain from additional paracentesis at this point in time unless abdominal distention increases significantly.  **Cancer-associated pain: Patient seems to be able to stay awake enough at this current medication levels with no uncontrolled pain.  Pain control is reasonably stable.  I do not believe that the insomnia is due to Dilaudid at this time, but patient can use lorazepam for sleeping aid. --Current pain regimen:     --Hydromorphone 2-4 mg oral every 3 hours as needed --Continue current regimen unchanged  **Diabetes mellitus: As expected, glucose is higher and now that patient has received glucocorticoids with her chemotherapy. --Continue SSI for now, may have to increase to higher grade scale  **Malnutrition, combined calorie/protein, severe: Severe decrease in oral intake due to peritoneal carcinomatosis and partial bowel obstruction with pain exacerbated by food intake.  Patient has been seen by dietitian.  Phosphorus level has stabilized, albumin is slightly improved compared to the prior days. --Continue diet and advance as tolerated  **Electrolytes/Fluids:  Hyponatremia: Complex hyponatremia due to intravascular volume depletion, pain, nausea, metastatic disease in the liver.  Sodium has normalized  Hypokalemia: Potassium has dropped likely due to increased urine output and insufficient oral intake.  Replaced with IV KCl/KPhos, resolved without recurrence.  Improved potassium after replacement. --Continue furosemide 40 mg daily by mouth  **PPx:  --Continue pantoprazole for stress ulcer prophylaxis  --Continue enoxaparin 40 mg subcu daily for DVT prophylaxis  **Code Status:  --Full code: Previously, patient was DNR/DNI, but we had additional conversation yesterday and patient insists on revival to be attempted in case of cardiac or respiratory arrest.  Have explained that her likelihood of successful revival is  negligible, likely less than 1% based on her extensive malignancy.  CODE STATUS reviewed again today outlining the poor prognosis in case of need for resuscitation.  Family continues to  maintain for the patient to remain full code.  **Disposition:  -Continue hospitalization at this time.  The present time, ability to discharge home is limited.   LOS: 27 days   Ardath Sax, MD   03/13/2017, 8:52 PM

## 2017-03-13 NOTE — Progress Notes (Addendum)
Nutrition Follow-up  DOCUMENTATION CODES:   Severe malnutrition in context of acute illness/injury  INTERVENTION:   Monitor magnesium, potassium, and phosphorus daily for at least 3 days, MD to replete as needed, as pt is at risk for refeeding syndrome given severe malnutrition and poor PO intakes x 1 month.  TPN per Pharmacy Continue Unjury chicken Soup BID, Each serving provides 100kcal and 21g protein   NEW NUTRITION DIAGNOSIS:   Severe Malnutrition related to acute illness, poor appetite, early satiety, cancer and cancer related treatments as evidenced by percent weight loss, energy intake < or equal to 75% for > or equal to 1 month, moderate fat depletion, moderate muscle depletion.  NUTRITION DIAGNOSIS:   Inadequate oral intake related to poor appetite, early satiety as evidenced by per patient/family report.  N/A  GOAL:   Patient will meet greater than or equal to 90% of their needs  MONITOR:   Weight trends, Labs, I & O's(TPN)  REASON FOR ASSESSMENT:   Consult New TPN/TNA  ASSESSMENT:   Pt with PMH significant for DM and HLD. Was recently found to have abnormal CT reading concerning for widespread metastatic disease. Admitted for newly diagnosed intra-abdominal and pulmonary metastases.   Significant Events: 1/18- paracentesisdrained2.5 L  1/25- paracentesis drained 1.85 L/Cycle 1 Taxol/carboplatin 1/28- paracentesis drained 0.92  2/4: Pt consuming 10-50% of meals, with most meals with <25% of completion. 2/8: Pt on full liquid diet only consuming bites/sips.  Pt sitting in chair during visit. Pt has been consuming 15-25% of meals and inconsistently drinking Unjury supplements. Given patient's poor PO intake over the past month while admitted, pt will start TPN today. Per palliative care notes, pt desires full code.   Pt has lost 9 lb since 1/18 (7% wt loss x 1 month, significant for time frame). Suspect edema is masking further weight  loss.  Medications: IV Lasix daily, Miralax packet daily, Senokot -S tablet BID, IV Mg sulfate once Labs reviewed: Mg/Phos WNL  NUTRITION - FOCUSED PHYSICAL EXAM:    Most Recent Value  Orbital Region  Mild depletion  Upper Arm Region  Moderate depletion  Thoracic and Lumbar Region  Unable to assess  Buccal Region  Mild depletion  Temple Region  Moderate depletion  Clavicle Bone Region  Severe depletion  Clavicle and Acromion Bone Region  Moderate depletion  Scapular Bone Region  Moderate depletion  Dorsal Hand  Moderate depletion  Patellar Region  Unable to assess  Anterior Thigh Region  Unable to assess  Posterior Calf Region  Unable to assess  Edema (RD Assessment)  Moderate  Hair  Reviewed  Eyes  Reviewed  Mouth  Reviewed  Skin  Reviewed  Nails  Reviewed       Diet Order:  Diet full liquid Room service appropriate? Yes; Fluid consistency: Thin TPN (CLINIMIX-E) Adult  EDUCATION NEEDS:   Education needs have been addressed  Skin:  Skin Assessment: Reviewed RN Assessment  Last BM:  2/11  Height:   Ht Readings from Last 1 Encounters:  02/23/17 4' 10.5" (1.486 m)    Weight:   Wt Readings from Last 1 Encounters:  03/10/17 112 lb (50.8 kg)    Ideal Body Weight:  44.3 kg  BMI:  Body mass index is 23.01 kg/m.  Estimated Nutritional Needs:   Kcal:  1790-1950 (33-36 kcal/kg)  Protein:  80-92 grams (1.5-1.7 grams/kg)  Fluid:  >/= 1.8 L/day  Clayton Bibles, MS, RD, LDN [facility] Dietitian Pager: [phone] After Hours Pager: [phone]

## 2017-03-13 NOTE — Progress Notes (Signed)
I have spoken to Dr Benay Spice. He will look over Jessica Gregory's chart and will come visit the family

## 2017-03-13 NOTE — Progress Notes (Signed)
Occupational Therapy Treatment Patient Details Name: Jessica Gregory MRN: [id number] DOB: [birth year] Today's Date: 03/13/2017    History of present illness 67 y.o. female with medical history significant of history of diabetes mellitus type 2, hyperlipidemia was sent to the ER for further evaluation due to abnormal CT scan and and patient feeling generally ill.  Patient has had abdominal discomfort for past couple of months. 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 Has had 1 diagnostic paracentsis and one pallaitive paracentesis thus far   OT comments  Pt really couldn't participate with OT today.  Performed total +2 assist transfer back to bed.  Pt had difficulty staying aroused.  Continues to decline in function.  Saw pt to get her back to bed and reposition on side for skin integrity.   Follow Up Recommendations  SNF    Equipment Recommendations  (as of today would use bedpan)    Recommendations for Other Services      Precautions / Restrictions Precautions Precautions: Fall Precaution Comments: watch O2 and HR  Restrictions Weight Bearing Restrictions: No       Mobility Bed Mobility           Sit to supine: Total assist;+2 for physical assistance      Transfers                Lateral/Scoot Transfers: Total assist;+2 safety/equipment General transfer comment: used pad one person in front and 2nd positioned between chair and bed for lateral scoot.  Pt did not actively participate other than lightly holding on to therapist's back and bearing weight through feet    Balance                                           ADL either performed or assessed with clinical judgement   ADL                           Toilet Transfer: Total assistance;+2 for safety/equipment(lateral transfer recliner to bed)             General ADL Comments: pt was unable to engage  in activity today; too lethargic.  Pt had been up in the chair for several hours.  Maximove lift battery was dead.  Performed transfer as above to reposition pt on her side     Vision       Perception     Praxis      Cognition Arousal/Alertness: Lethargic Behavior During Therapy: Flat affect Overall Cognitive Status: Impaired/Different from baseline                                 General Comments: pt not very responsive today; opened eyes momentarily when daughter spoke loudly to her        Exercises     Shoulder Instructions       General Comments      Pertinent Vitals/ Pain       Pain Assessment: Faces Faces Pain Scale: No hurt  Home Living  Prior Functioning/Environment              Frequency           Progress Toward Goals  OT Goals(current goals can now be found in the care plan section)  Progress towards OT goals: Not progressing toward goals - comment(goals downgraded on last visit)     Plan Discharge plan needs to be updated    Co-evaluation                 AM-PAC PT "6 Clicks" Daily Activity     Outcome Measure   Help from another person eating meals?: Total Help from another person taking care of personal grooming?: Total Help from another person toileting, which includes using toliet, bedpan, or urinal?: Total Help from another person bathing (including washing, rinsing, drying)?: Total Help from another person to put on and taking off regular upper body clothing?: Total Help from another person to put on and taking off regular lower body clothing?: Total 6 Click Score: 6    End of Session    OT Visit Diagnosis: Unsteadiness on feet (R26.81);Muscle weakness (generalized) (M62.81)   Activity Tolerance Patient limited by lethargy   Patient Left in bed;with call bell/phone within reach;with nursing/sitter in room   Nurse Communication           Time: 1537-1600 OT Time Calculation (min): 23 min  Charges: OT General Charges $OT Visit: 1 Visit OT Treatments $Therapeutic Activity: 8-22 mins  Lesle Chris, OTR/L [phone] 03/13/2017   Emree Locicero 03/13/2017, 4:15 PM

## 2017-03-13 NOTE — Significant Event (Signed)
Rapid Response Event Note  Overview: Time Called: 1810 Arrival Time: 6720 Event Type: Respiratory, Neurologic Upon arrival patient was very lethargic. Patient's respiratory effort was minimal. Respirations appeared at times agonal. Respirations were at a rate 8-10/min. O2 Sats appeared to be in normal range. Overall skin appearance was pale and gray. Placed a NRB on patient due to patient's minimal respiratory effort. Family at bedside. Initial Focused Assessment: Neuro: Responds to voice, patient not able to speak english, daughter at bedside translating, patient not able to provide correct answers to daughters questions per daughter. Pupils 2-3+ equal and reactive to light but sluggish. Patient very weak not able to move extremities. Cardiac: NSR, S1 and S2 heard upon auscultation, 1+ radial and pedal pulses in the respected extremities bilaterally. BP WNL 11/72(86) Pulmonary: Breath sounds very diminished through out all lung fields, O2 Sats on NRB 99-100%, RR 8-10/min. Agonal at times.  Interventions: Placed order for ABG, notified respiratory MD Sherril Paged to bedside   Plan of Care (if not transferred): -MD Sherril discussed patient's prognosis at bedside. Daughter and family came to decision to make patient DNR and to keep comfortable.  Event Summary:   at      at          [place]

## 2017-03-13 NOTE — Progress Notes (Signed)
IP PROGRESS NOTE  Subjective:   I was called to see Jessica Gregory this evening secondary to altered mental status and decreased respirations.  The rapid response team was present.  Her family is at the bedside.  Her daughter serves as an Astronomer. The nursing staff report she has become increasingly lethargic during the day today.  She last received oral Dilaudid early this morning.  Objective: Vital signs in last 24 hours: Blood pressure 112/69, pulse (!) 111, temperature 97.8 F (36.6 C), temperature source Axillary, resp. rate (!) 8, height 4' 10.5" (1.486 m), weight 112 lb (50.8 kg), SpO2 100 %.  Intake/Output from previous day: 02/13 0701 - 02/14 0700 In: 120 [P.O.:120] Out: 100 [Urine:100]  Physical Exam:   Lungs: Diminished breath sounds throughout the left greater than right chest, decreased respiratory rate, no respiratory distress, scattered rhonchi Cardiac: Tachycardia, regular rate and rhythm Abdomen: Distended with ascites Extremities: Pitting edema at the lower leg bilaterally Neurologic: Opens eyes, follows a few simple commands, not speaking    Lab Results: Recent Labs    03/11/17 1010 03/12/17 0500  WBC 25.0* 24.6*  HGB 11.2* 10.8*  HCT 36.7 36.1  PLT 264 248    BMET Recent Labs    03/11/17 1010 03/12/17 0500  NA 135 137  K 4.4 4.5  CL 90* 91*  CO2 32 32  GLUCOSE 161* 121*  BUN 24* 26*  CREATININE 0.66 0.69  CALCIUM 8.2* 8.5*    Lab Results  Component Value Date   CEA1 0.9 02/19/2017    Studies/Results: Dg Chest 2 View  Result Date: 03/12/2017 CLINICAL DATA:  Follow-up pleural effusion, shortness of breath, weakness, history diabetes mellitus, metastatic cancer/peritoneal carcinomatosis primary tumor not specified EXAM: CHEST  2 VIEW COMPARISON:  03/02/2017 FINDINGS: LEFT arm PICC line tip projects over cavoatrial junction. Enlargement of cardiac silhouette with vascular congestion. Diffuse pulmonary infiltrates consistent with pulmonary  edema. Bibasilar effusions atelectasis, slightly increased. No pneumothorax. Bones demineralized. IMPRESSION: Increased pulmonary edema, bibasilar pleural effusions and bibasilar atelectasis. Electronically Signed   By: Lavonia Dana M.D.   On: 03/12/2017 11:22    Medications: I have reviewed the patient's current medications.  Assessment/Plan: Jessica Gregory has an advanced metastatic malignancy.  Her overall status has not improved despite receiving a cycle of Taxol/carboplatin chemotherapy.  She has developed somnolence and decreased respirations this evening.  I suspect these symptoms are related to respiratory failure due to progression of malignancy.  I discussed the poor prognosis with her family members including her daughter who served as an Astronomer.  We discussed CPR and ACLS issues.  The family understand her life span may be limited to minutes or hours.  They agree to a no CODE BLUE status.  She has been seen in consultation by the palliative care service and critical care medicine during this admission.  Critical care medicine recommended a DNR status.     LOS: 27 days   Betsy Coder, MD   03/13/2017, 7:13 PM

## 2017-03-13 NOTE — Progress Notes (Signed)
Rapid response & Dr Benay Spice present; ABG ordered, NRB placed

## 2017-03-13 NOTE — Progress Notes (Signed)
PHARMACY - ADULT TOTAL PARENTERAL NUTRITION CONSULT NOTE   Pharmacy Consult for TPN Indication: partial bowel obstruction   Patient Measurements: Height: 4' 10.5" (148.6 cm) Weight: 112 lb (50.8 kg) IBW/kg (Calculated) : 42.05 TPN AdjBW (KG): 45.2 Body mass index is 23.01 kg/m.  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  Patient with partial bowel obstruction and inadequate oral intake, enteral tube placement not an option.  Unable to proceed with second cycle of chemotherapy due to severe weakness.  Will begin TPN per oncology to improve nutritional status.  Significant events:   Today, 03/13/2017:    Glucose - h/o DM, Hgb A1c 9.4 on 02/24/2017. SSI q4h ordered, CBGs ok.  Electrolytes - no labs today but the BMET for the past several days have been stable.  Yesterday's labs show K+ 4.5, Phos 3.5, Mag 1.9 - will replace Mag today. Chloride low.  Renal - SCr WNL  LFTs - WNL  TGs - ordered for AM  Prealbumin - ordered for AM  NUTRITIONAL GOALS                                                                                             RD recs: pending Clinimix E 5/15 at a goal rate of 60 ml/hr over 24 hours + 20% fat emulsion at 16 ml/hr over 12 hours to provide: 72 g/day protein, 1406 Kcal/day.  Will adjust goal rate following RD assessment and nutrition goal recommendations.  PLAN                                                                                                                         Magnesium sulfate 1g IV x 1 now.  At 1800 today:  Start Clinimix E 5/15 at 30 ml/hr.  Start conservatively  due to refeeding risk.  20% fat emulsion at 10 ml/hr over 12 hours.  Plan to advance as tolerated to the goal rate.  Continue providing multivitamins by mouth once daily but switch to liquid multivitamin with elements formulation.  Continue CBG checks and sensitive SSI q4h.   TPN lab panels on Mondays & Thursdays. TPN labs tomorrow AM.  F/u daily.   Hershal Coria 03/13/2017,12:45 PM

## 2017-03-13 NOTE — Progress Notes (Signed)
Daily Progress Note   Patient Name: Jessica Gregory       Date: 03/13/2017 DOB: Sep 02, 1950  Age: 67 y.o. MRN#: [id number] Attending Physician: Ardath Sax, MD Primary Care Physician: Forrest Moron, MD Admit Date: 02/16/2017  Reason for Consultation/Follow-up: Establishing goals of care, Non pain symptom management, Pain control and Psychosocial/spiritual support  Subjective: Patient is up in chair resting with daughter at bedside. Daughter reports her mother has been sleeping most of the day and she is concerned about her getting weaker and weaker. Daughter reports pain being controlled on Dilaudid PO. States she is unable to stand up and assist with transfers today, she is also concerned about having to wake her up to eat lunch and having to feed her.   Her appetite remains poor and oral intake remains poor. She is going to get started on TPN this afternoon per Dr. Lebron Conners.  Patient has fallen x3 in the past 24 hours. Per daughter she complained that her "legs were weak and gave out."  Length of Stay: 27  Current Medications: Scheduled Meds:  . Chlorhexidine Gluconate Cloth  6 each Topical Q0600  . enoxaparin (LOVENOX) injection  40 mg Subcutaneous Q24H  . fluconazole  100 mg Oral Daily  . furosemide  20 mg Intravenous Daily  . [START ON 03/14/2017] geriatric multivitamins-minerals  15 mL Oral Daily  . insulin aspart  0-9 Units Subcutaneous Q4H  . octreotide  100 mcg Subcutaneous Q8H  . pantoprazole  40 mg Oral Daily  . polyethylene glycol  17 g Oral Daily  . protein supplement  8 oz Oral BID  . psyllium  1 packet Oral Daily  . senna-docusate  1 tablet Oral BID  . simethicone  40 mg Oral QID  . sodium chloride flush  10-40 mL Intracatheter Q12H    Continuous Infusions: . Marland KitchenTPN  (CLINIMIX-E) Adult     And  . fat emulsion      PRN Meds: guaiFENesin-dextromethorphan, HYDROmorphone, levalbuterol, LORazepam, prochlorperazine, sodium chloride flush, sodium chloride flush, sodium chloride flush  Physical Exam: Constitutional: She appearscachectic. Sheappears ill.  Musculoskeletal:  She exhibits decreased strength.Bilateral lower extremity pitting edema.  Generalized weakness Neurological: Sleeping. Arouses but immediately falls back to sleep.   Vital Signs: BP 107/70 (BP Location: Right Arm)   Pulse (!) 122 Comment: rn was  notified  Temp 99 F (37.2 C) (Oral)   Resp 14   Ht 4' 10.5" (1.486 m)   Wt 50.8 kg (112 lb)   SpO2 100%   BMI 23.01 kg/m   SpO2: SpO2: 100 % O2 Device: O2 Device: Nasal Cannula O2 Flow Rate: O2 Flow Rate (L/min): 3 L/min  Intake/output summary:   Intake/Output Summary (Last 24 hours) at 03/13/2017 1621 Last data filed at 03/12/2017 1740 Gross per 24 hour  Intake -  Output 100 ml  Net -100 ml   LBM: Last BM Date: 03/10/17 Baseline Weight: Weight: 55.2 kg (121 lb 11.1 oz) Most recent weight: Weight: 50.8 kg (112 lb)       Palliative Assessment/Data: PPS 20%    Flowsheet Rows     Most Recent Value  Intake Tab  Referral Department  Oncology  Palliative Care Primary Diagnosis  Cancer  Date Notified  03/03/17  Palliative Care Type  New Palliative care  Reason for referral  Pain, Clarify Goals of Care  Date of Admission  02/08/2017  Date first seen by Palliative Care  03/04/17  # of days Palliative referral response time  1 Day(s)  # of days IP prior to Palliative referral  18  Clinical Assessment  Psychosocial & Spiritual Assessment  Palliative Care Outcomes      Patient Active Problem List   Diagnosis Date Noted  . SBO (small bowel obstruction) ([place])   . Constipation 03/03/2017  . Fever   . Abdominal distention   . Acute delirium   . Hyponatremia   . Acute respiratory failure ([place]) 02/26/2017  .  Electrolyte imbalance 02/26/2017  . Hypokalemia 02/25/2017  . Intra-abdominal malignant neoplasm ([place])   . Ascites   . SOB (shortness of breath)   . Cancer associated pain 02/21/2017  . Nausea   . Admission for chemotherapy   . Peritoneal carcinomatosis ([place])   . Metastatic cancer ([place])   . Sepsis ([place]) 02/14/2017  . Diffuse papular rash 08/01/2016  . Psoriasis 08/01/2016  . Chronic hepatitis B ([place]) 08/01/2016  . Hepatitis, viral 03/01/2015  . Controlled type 2 diabetes mellitus without complication, without long-term current use of insulin ([place]) 03/01/2015  . Uncontrolled diabetes mellitus ([place]) 11/21/2014  . 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 Patient has had abdominal discomfort for past couple of months and underwent CT scanning at urgent care, which showed concerns for widespread metastatic disease. According to the daughter patient has not been seeing any physician and was diagnosed with diabetes mellitus type 2 about 6 months ago and has been on metformin. No other complaints including nausea, vomiting, unintentional weight loss.She has had poor appetite over the last 2 weeks and attributes it to less desire to eat. Previously patient has not had any screening procedures/imaging such as colonoscopy or mammography. Denies any family history of malignancies.   Assessment: SBO Sepsis/delirium multi factorial. pulm mets  Possible GB malignancy or Possible ovarian malignancy Recurrent malignant ascites.  Uncontrolled multi factorial pain.  Cancer cachexia/anorexia  Severe calorie/protein malnutrition   Recommendations/Plan:  RemainsFULL code   Continueprn Dilaudid 2-4mg  POPRN for pain control. Daughter verbalizes this is  controlling her pain appropriately.   Patient will need 24 hour supervision at disposition   Will continue to follow and support patient and family during hospitalization.Daughter is questioning the  fact that her mom seems to be getting weaker and is falling. She would like to see if there is any improvement with the TPN and inquired about if no change what options would be next. Instructed daughters that providers will continue to monitor her status and go day by day and we will continue to be in close connection with Dr. Lebron Conners and his recommendations.   Will continue to progress conversation regarding goals as family is emotionally willing/able to do so.  Patient has experienced 3 falls within 24 hrs. Recommend not transferring without appropriate devices, 2-3 staff assist, and family members to call staff for assistance when needing to toilet or transfer.    Goals of Care and Additional Recommendations:  Limitations on Scope of Treatment: Full Scope Treatment  Code Status:    Code Status Orders  (From admission, onward)        Start     Ordered   02/27/17 1315  Full code  Continuous     02/27/17 1314    Code Status History    Date Active Date Inactive Code Status Order ID Comments User Context   02/26/2017 08:05 02/27/2017 13:14 DNR [id number]  Ardath Sax, MD Inpatient   02/14/2017 00:35 02/26/2017 08:05 Full Code [id number]  Damita Lack, MD ED      Prognosis:  Guarded with poor prognosis given widespread malignancy, deconditioning, and malnutrition  Discharge Planning:  To Be Determined  Care plan was discussed with daughter, bedside nurse, CSW, and Dr. Lebron Conners.   Thank you for allowing the Palliative Medicine Team to assist in the care of this patient.   Total Time 45 min.  Prolonged Time Billed  No      Greater than 50%  of this time was spent counseling and coordinating care related to the above assessment and plan.  Alda Lea,  AGNP-C Palliative Medicine Team  Phone: [phone] Fax: [phone]  Please contact Palliative Medicine Team phone at [phone] for questions and concerns.    ADDENDUM:  I saw and examined Ms. Demeter this afternoon in conjunction with [employer]    Discussed plan for initiation of TPN over the weekend to see if she is able to regain strength and functional status.  We also discussed my concern that this may be irreversible decline and, if so, she may not be a candidate for further disease modifying therapy.  Her daughter will continue to rely on Dr. Lebron Conners to guide decision making.  We will continue to support as able.  I have reviewed and discussed the care of this patient in detail with the nurse practitioner including pertinent patient records, physical exam findings and data. I agree with details of this encounter.  Total time: 45 minutes Micheline Rough, MD [place] Team [phone]   Micheline Rough, MD [place] Team [phone]

## 2017-03-14 ENCOUNTER — Inpatient Hospital Stay (HOSPITAL_COMMUNITY): Payer: 59

## 2017-03-14 DIAGNOSIS — J90 Pleural effusion, not elsewhere classified: Secondary | ICD-10-CM

## 2017-03-14 LAB — CBC WITH DIFFERENTIAL/PLATELET
Basophils Absolute: 0 10*3/uL (ref 0.0–0.1)
Basophils Relative: 0 %
Eosinophils Absolute: 0 10*3/uL (ref 0.0–0.7)
Eosinophils Relative: 0 %
HCT: 33.4 % — ABNORMAL LOW (ref 36.0–46.0)
Hemoglobin: 9.9 g/dL — ABNORMAL LOW (ref 12.0–15.0)
Lymphocytes Relative: 6 %
Lymphs Abs: 1.2 10*3/uL (ref 0.7–4.0)
MCH: 25.6 pg — ABNORMAL LOW (ref 26.0–34.0)
MCHC: 29.6 g/dL — ABNORMAL LOW (ref 30.0–36.0)
MCV: 86.5 fL (ref 78.0–100.0)
Monocytes Absolute: 0.4 10*3/uL (ref 0.1–1.0)
Monocytes Relative: 2 %
Neutro Abs: 18.5 10*3/uL — ABNORMAL HIGH (ref 1.7–7.7)
Neutrophils Relative %: 92 %
Platelets: 225 10*3/uL (ref 150–400)
RBC: 3.86 MIL/uL — ABNORMAL LOW (ref 3.87–5.11)
RDW: 15.7 % — ABNORMAL HIGH (ref 11.5–15.5)
WBC: 20.1 10*3/uL — ABNORMAL HIGH (ref 4.0–10.5)

## 2017-03-14 LAB — COMPREHENSIVE METABOLIC PANEL WITH GFR
ALT: 23 U/L (ref 14–54)
AST: 20 U/L (ref 15–41)
Albumin: 2.1 g/dL — ABNORMAL LOW (ref 3.5–5.0)
Alkaline Phosphatase: 80 U/L (ref 38–126)
Anion gap: 12 (ref 5–15)
BUN: 34 mg/dL — ABNORMAL HIGH (ref 6–20)
CO2: 35 mmol/L — ABNORMAL HIGH (ref 22–32)
Calcium: 8.1 mg/dL — ABNORMAL LOW (ref 8.9–10.3)
Chloride: 89 mmol/L — ABNORMAL LOW (ref 101–111)
Creatinine, Ser: 0.63 mg/dL (ref 0.44–1.00)
GFR calc Af Amer: >60 mL/min
GFR calc non Af Amer: >60 mL/min
Glucose, Bld: 155 mg/dL — ABNORMAL HIGH (ref 65–99)
Potassium: 4.2 mmol/L (ref 3.5–5.1)
Sodium: 136 mmol/L (ref 135–145)
Total Bilirubin: 0.4 mg/dL (ref 0.3–1.2)
Total Protein: 5.7 g/dL — ABNORMAL LOW (ref 6.5–8.1)

## 2017-03-14 LAB — GLUCOSE, CAPILLARY
GLUCOSE-CAPILLARY: 117 mg/dL — AB (ref 65–99)
GLUCOSE-CAPILLARY: 183 mg/dL — AB (ref 65–99)
GLUCOSE-CAPILLARY: 217 mg/dL — AB (ref 65–99)
GLUCOSE-CAPILLARY: 222 mg/dL — AB (ref 65–99)
Glucose-Capillary: 118 mg/dL — ABNORMAL HIGH (ref 65–99)
Glucose-Capillary: 178 mg/dL — ABNORMAL HIGH (ref 65–99)
Glucose-Capillary: 184 mg/dL — ABNORMAL HIGH (ref 65–99)
Glucose-Capillary: 119 mg/dL — ABNORMAL HIGH (ref 65–99)

## 2017-03-14 LAB — PHOSPHORUS: Phosphorus: 2.9 mg/dL (ref 2.5–4.6)

## 2017-03-14 LAB — LACTIC ACID, PLASMA: Lactic Acid, Venous: 1.2 mmol/L (ref 0.5–1.9)

## 2017-03-14 LAB — MAGNESIUM: Magnesium: 2 mg/dL (ref 1.7–2.4)

## 2017-03-14 MED ORDER — ALTEPLASE 2 MG IJ SOLR
2.0000 mg | Freq: Once | INTRAMUSCULAR | Status: AC
  Administered 2017-03-14: 2 mg
  Filled 2017-03-14: qty 2

## 2017-03-14 MED ORDER — FAT EMULSION 20 % IV EMUL
120.0000 mL | INTRAVENOUS | Status: AC
  Administered 2017-03-14: 120 mL via INTRAVENOUS
  Filled 2017-03-14: qty 120

## 2017-03-14 MED ORDER — LIDOCAINE HCL 1 % IJ SOLN
INTRAMUSCULAR | Status: AC
  Filled 2017-03-14: qty 10

## 2017-03-14 MED ORDER — HYDROMORPHONE HCL 4 MG PO TABS
2.0000 mg | ORAL_TABLET | ORAL | Status: DC | PRN
  Administered 2017-03-14 – 2017-03-16 (×8): 2 mg via ORAL
  Administered 2017-03-17: 1 mg via ORAL
  Administered 2017-03-17: 2 mg via ORAL
  Filled 2017-03-14 (×10): qty 1

## 2017-03-14 MED ORDER — CLINIMIX E/DEXTROSE (5/15) 5 % IV SOLN
INTRAVENOUS | Status: AC
  Administered 2017-03-14: 18:00:00 via INTRAVENOUS
  Filled 2017-03-14: qty 720

## 2017-03-14 NOTE — Progress Notes (Signed)
No blood r/t from the PICC in the [facility]. Order will be put in for [facility]

## 2017-03-14 NOTE — Progress Notes (Addendum)
PHARMACY - ADULT TOTAL PARENTERAL NUTRITION CONSULT NOTE   Pharmacy Consult for TPN Indication: partial bowel obstruction   Patient Measurements: Height: 4' 10.5" (148.6 cm) Weight: 111 lb 15.9 oz (50.8 kg) IBW/kg (Calculated) : 42.05 TPN AdjBW (KG): 45.2 Body mass index is 23.01 kg/m.  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  Patient with partial bowel obstruction and inadequate oral intake, enteral tube placement not an option per discussion with oncologist.  Unable to proceed with second cycle of chemotherapy due to severe weakness.  Will begin TPN per oncology to improve nutritional status.  Significant events:  2/14 TPN started briefly in evening.  Patient with lethargy, AMS, decreased respirations. TPN stopped.   2/15 Patient mental status improved, resume TPN per oncology. Paracentesis by IR removed 2.3L.  Today, 03/14/2017:    Glucose - h/o DM, Hgb A1c 9.4 on 02/25/2017. SSI q4h ordered, CBGs elevated this morning.  Electrolytes - this morning labs pending. RN unable to draw - PICC clotted, alteplase ordered.  The BMET for the past several days have been stable.  Last labs 2/13 show K+ 4.5, Phos 3.5, Mag 1.9 - Mag 1g given yesterday. Chloride low.  Renal - SCr WNL  LFTs - WNL  TGs - pending collection  Prealbumin - pending collection  NUTRITIONAL GOALS                                                                                             RD recs: 80-92 grams protein (1.5-1.7 grams/kg), 1790-1950 (33-36 kcal/kg) Clinimix E 5/15 at a goal rate of 75 ml/hr over 24 hours + 20% fat emulsion at 20  ml/hr over 12 hours to provide: 90 g/day protein, 1758 Kcal/day.   PLAN  Replace electrolytes if needed based on results of today's labs.  At 1800 today:  Start Clinimix E 5/15 at 30 ml/hr. Start conservatively due to refeeding risk.  20% fat emulsion at 10 ml/hr over 12 hours.  Plan to advance as tolerated to the goal rate.  Continue liquid multivitamin with elements daily.  Continue CBG checks and sensitive SSI q4h.   TPN lab panels on Mondays & Thursdays. TPN labs tomorrow AM.  F/u daily.   Hershal Coria 03/14/2017,8:04 AM   ADDENDUM: 03/14/2017 2:37 PM Labs for today resulted. No electrolyte replacement needed. Will need to monitor Phos trend. Continue with TPN plans as above.  Hershal Coria, PharmD Pager: [phone] 03/14/2017

## 2017-03-14 NOTE — Procedures (Signed)
PROCEDURE SUMMARY:  Successful US guided paracentesis from LLQ.  Yielded 2.3 L of clear yellow fluid.  No immediate complications.  Pt tolerated well.   Specimen was not sent for labs.  Ascencion Dike PA-C 03/14/2017 11:43 AM

## 2017-03-14 NOTE — Progress Notes (Signed)
This CM checked in with pt and daughter to see if a decision had been made about needing a hospital bed at home. Daughter would like to wait until next week to make decision. CM will continue to follow. Marney Doctor RN,BSN,NCM [phone]

## 2017-03-14 NOTE — Progress Notes (Signed)
PT Cancellation Note  Patient Details Name: Jessica Gregory MRN: [id number] DOB: Sep 26, 1950   Cancelled Treatment:    Reason Eval/Treat Not Completed: Fatigue/lethargy limiting ability to participate(pt sleeping soundly, did not arouse to verbal/tactile stimulii, daughter also attempted to arouse pt but was unsuccessful. Will try back later. )   Philomena Doheny 03/14/2017, 1:43 PM [phone]

## 2017-03-14 NOTE — Progress Notes (Signed)
IP PROGRESS NOTE  Subjective:  Last night, patient had an episode of progressive obtundation and agonal breathing.  Rapid response was called, patient was thoroughly evaluated.  ABG demonstrated carbon dioxide retention, oxygen supply was improved.  TPN and other nonessential treatments were held.  This morning, patient is awake, and back to her previous baseline.  No bowel movement yet.  In addition, last night she was found to have urinary retention and Foley catheter was placed for urine mobilization.  Abdominal pain still well controlled.  Objective: Vital signs in last 24 hours: Blood pressure (!) 100/52, pulse (!) 105, temperature 97.8 F (36.6 C), temperature source Oral, resp. rate 18, height 4' 10.5" (1.486 m), weight 111 lb 15.9 oz (50.8 kg), SpO2 100 %.  Intake/Output from previous day: 02/14 0701 - 02/15 0700 In: -  Out: 150 [Urine:150]  Physical Exam: Sitting up in the chair.  Still appears mildly sleepy, but breathing comfortably on 2 L of nasal cannula.  No acute distress HEENT: Anicteric sclera, moist mucous membranes Lungs: Decreased breath sounds bilaterally with expiratory wheezing and frequent nonproductive cough Cardiac: S1/S2, regular, no murmurs Abdomen: Stable abdominal distention with diffuse tenderness  Lymph nodes: No palpable lymphadenopathy in the cervical, supraclavicular, axillary, or inguinal regions Neurologic: Unable to evaluate today Skin: No rash, petechiae, or ecchymosis. Musculoskeletal: Bilateral lower extremity swelling.   Lab Results: Recent Labs    03/11/17 1010 03/12/17 0500  WBC 25.0* 24.6*  HGB 11.2* 10.8*  HCT 36.7 36.1  PLT 264 248    BMET Recent Labs    03/11/17 1010 03/12/17 0500  NA 135 137  K 4.4 4.5  CL 90* 91*  CO2 32 32  GLUCOSE 161* 121*  BUN 24* 26*  CREATININE 0.66 0.69  CALCIUM 8.2* 8.5*    Lab Results  Component Value Date   CEA1 0.9 02/19/2017    Studies/Results: Dg Chest 2 View  Result Date:  03/12/2017 CLINICAL DATA:  Follow-up pleural effusion, shortness of breath, weakness, history diabetes mellitus, metastatic cancer/peritoneal carcinomatosis primary tumor not specified EXAM: CHEST  2 VIEW COMPARISON:  03/02/2017 FINDINGS: LEFT arm PICC line tip projects over cavoatrial junction. Enlargement of cardiac silhouette with vascular congestion. Diffuse pulmonary infiltrates consistent with pulmonary edema. Bibasilar effusions atelectasis, slightly increased. No pneumothorax. Bones demineralized. IMPRESSION: Increased pulmonary edema, bibasilar pleural effusions and bibasilar atelectasis. Electronically Signed   By: Lavonia Dana M.D.   On: 03/12/2017 11:22    Medications: I have reviewed the patient's current medications.  Assessment/Plan: 67 year old female who presented with abdominal pain and partial bowel obstruction with findings of widely metastatic malignancy including peritoneal carcinomatosis, hepatic lesions, and multifocal bilateral pulmonary involvement.  Peritoneal fluid positive for presence of cells consistent with adenocarcinoma.    **Hypercarbic Respiratory Failure: Recurrent events, this is #3.  All events are attributable to attempts at improving pain control with increased opioid administration in 1 or 2 days prior to the event.  In this instance, patient has received several doses of hydromorphone 4 mg the day prior to the event.  Patient's respiratory function is very tenuous to a combination of lymphangitic infiltration by her malignancy, bilateral pleural effusions, and pulmonary edema component of anasarca due to low oncotic pressure due to hypoalbuminemia. --Avoid escalation of opioid therapy or co-administration of significant doses of opioids and benzodiazepines. -Patient's family finally agreed to DNR status which I fully support.  **Metastatic adenocarcinoma, likely primary peritoneal with liver and lung metastasis: Differential included cholangiocarcinoma or primary  gallbladder adenocarcinoma based  on the dominant lesion in the liver.  Nevertheless, distribution of the disease as well as elevation of CA 125 and normal CA19-9 with noncontributory immunohistochemical profile favor primary peritoneal or ovarian etiology. Received the initial chemotherapy with carboplatin AUC 5 + paclitaxel 125 mg/m Q21d on 02/21/17.  Repeat CT scan of the abdomen and pelvis demonstrates essentially stable findings.  Radiographic appearance is still very worrisome for possible primary gallbladder carcinoma based on the thickened gallbladder and appearance of direct hepatic invasion.  At this time, patient has received the first dose of systemic chemotherapy and no additional treatment will be administered until 03/14/17.  Between worsening bowel obstruction, rising leukocytosis, and lack of significant improvement in physical state of the patient, I am becoming less and less optimistic about the likely outcome of the cancer management strategy we have pursued, nevertheless, patient and her family maintain interest in pursuing further systemic therapy. --No chemotherapy today. - If patient's function improves over the weekend, will consider administering carboplatin and paclitaxel on Monday or Tuesday.  **SBO, partial/Ileus due to opioids/severe malnutrition: Recent CT of the abdomen pelvis demonstrates persistent small bowel distention consistent with at least partial obstruction.  In addition to that,: Appears distended with retention of stool which is likely due to opioid medications.  Patient started moving her bowels again yesterday.  No recurrence of vomiting suggesting improvement in the bowel function.  Acute urinary retention likely due to increased opioid dosing yesterday.  Foley catheter placed. --Continue gentle oral nutrition.  We will reserve initiation of TPN only for development of complete bowel obstruction if suctioning of the gastric contents is necessary. --Unfortunately,  cannot use methylnaltrexone due to presence of bowel obstruction. -Continue gentle laxatives   --Senna/Docusate + Psyllum + Miralax QDay --Discontinue octreotide --Resume TPN on the patient today --as long as the patient stated that the goal of care is to receive systemic therapy for her malignancy, nutritional support needs to be delivered as oral intake is very inadequate continued survival.  -Restart TPN labs  **Recurrent malignant ascites: Due to underlying malignancy.  Will reaccumulate rapidly until the cancer is addressed. Paracentesis 02/21/17, 02/24/17.  Persistent significant pain in the abdomen with increased bowel output.  No recurrence of significant ascites noted at this time.  Most of the abdominal distention is due to the partial bowel obstruction. --Consult to interventional radiology for possible therapeutic paracentesis today. -We will deliver albumin depending on the amount of peritoneal fluid removed  **Cancer-associated pain: Patient seems to be able to stay awake enough at this current medication levels with no uncontrolled pain.  Pain control is reasonably stable.  --Current pain regimen:     --Hydromorphone 2 mg oral every 3 hours as needed --Do not increase current opioid dosing as patient easily decompensates in her respiratory function due to suppression  **Diabetes mellitus: As expected, glucose is higher and now that patient has received glucocorticoids with her chemotherapy. --Continue SSI for now, may have to increase to higher grade scale  **Malnutrition, combined calorie/protein, severe: Severe decrease in oral intake due to peritoneal carcinomatosis and partial bowel obstruction with pain exacerbated by food intake.  Patient has been seen by dietitian.  Phosphorus level has stabilized, albumin is slightly improved compared to the prior days. --Continue diet and advance as tolerated -Start TPN to augment nutrition  **Electrolytes/Fluids:  Hyponatremia:  Complex hyponatremia due to intravascular volume depletion, pain, nausea, metastatic disease in the liver.  Sodium has normalized  Hypokalemia: Potassium has dropped likely due to increased urine  output and insufficient oral intake.  Replaced with IV KCl/KPhos, resolved without recurrence.  Improved potassium after replacement. --Continue furosemide 40 mg daily by mouth  **PPx:  --Continue pantoprazole for stress ulcer prophylaxis  --Continue enoxaparin 40 mg subcu daily for DVT prophylaxis  **Code Status:  --DNR: Patient's family, fortunately, listened to care team and allowed [place] to switch the code to DNR from the full code the patient was previously.  Attempting revival/CPR on this patient would be traumatic and ultimately futile considering her underlying diagnosis and degree of disability she has developed  **Disposition:  -Continue hospitalization at this time.  Patient is requiring significant physical, nutritional, and medical support which is impossible to deliver at home based on available assistance there.   LOS: 28 days   Ardath Sax, MD   03/14/2017, 8:22 AM

## 2017-03-14 NOTE — Progress Notes (Signed)
Daily Progress Note   Patient Name: Jessica Gregory       Date: 03/14/2017 DOB: [birth year]  Age: 67 y.o. MRN#: [id number] Attending Physician: Ardath Sax, MD Primary Care Physician: Forrest Moron, MD Admit Date: 02/07/2017  Reason for Consultation/Follow-up: Establishing goals of care, Non pain symptom management, Pain control and Psychosocial/spiritual support  Subjective: Patient sleepy at time out of my encounter.  Husband present at bedside.  Limited english and therefore limited conversation this evening.  Length of Stay: 28  Current Medications: Scheduled Meds:  . Chlorhexidine Gluconate Cloth  6 each Topical Q0600  . enoxaparin (LOVENOX) injection  40 mg Subcutaneous Q24H  . fluconazole  100 mg Oral Daily  . furosemide  20 mg Intravenous Daily  . geriatric multivitamins-minerals  15 mL Oral Daily  . insulin aspart  0-9 Units Subcutaneous Q4H  . lidocaine      . pantoprazole  40 mg Oral Daily  . polyethylene glycol  17 g Oral Daily  . protein supplement  8 oz Oral BID  . psyllium  1 packet Oral Daily  . senna-docusate  1 tablet Oral BID  . simethicone  40 mg Oral QID  . sodium chloride flush  10-40 mL Intracatheter Q12H    Continuous Infusions: . Marland KitchenTPN (CLINIMIX-E) Adult 30 mL/hr at 03/14/17 1733   And  . fat emulsion 120 mL (03/14/17 1733)    PRN Meds: guaiFENesin-dextromethorphan, HYDROmorphone, levalbuterol, LORazepam, prochlorperazine, sodium chloride flush, sodium chloride flush, sodium chloride flush  Physical Exam: Constitutional: She appearscachectic. Sheappears ill.  Musculoskeletal:  She exhibits decreased strength.Bilateral lower extremity pitting edema.  Generalized weakness Neurological: Sleeping. Arouses but immediately falls back to  sleep.   Vital Signs: BP (!) 99/58 (BP Location: Right Arm)   Pulse 98   Temp 97.8 F (36.6 C) (Axillary)   Resp 16   Ht 4' 10.5" (1.486 m)   Wt 50.8 kg (111 lb 15.9 oz)   SpO2 99%   BMI 23.01 kg/m  SpO2: SpO2: 99 % O2 Device: O2 Device: Nasal Cannula O2 Flow Rate: O2 Flow Rate (L/min): 2 L/min  Intake/output summary:   Intake/Output Summary (Last 24 hours) at 03/14/2017 2208 Last data filed at 03/14/2017 1800 Gross per 24 hour  Intake 0 ml  Output 950 ml  Net -950 ml   LBM:  Last BM Date: 03/10/17 Baseline Weight: Weight: 55.2 kg (121 lb 11.1 oz) Most recent weight: Weight: 50.8 kg (111 lb 15.9 oz)       Palliative Assessment/Data: PPS 20%    Flowsheet Rows     Most Recent Value  Intake Tab  Referral Department  Oncology  Palliative Care Primary Diagnosis  Cancer  Date Notified  03/03/17  Palliative Care Type  New Palliative care  Reason for referral  Pain, Clarify Goals of Care  Date of Admission  02/21/2017  Date first seen by Palliative Care  03/04/17  # of days Palliative referral response time  1 Day(s)  # of days IP prior to Palliative referral  18  Clinical Assessment  Psychosocial & Spiritual Assessment  Palliative Care Outcomes      Patient Active Problem List   Diagnosis Date Noted  . Pleural effusion   . Protein-calorie malnutrition, severe 03/13/2017  . SBO (small bowel obstruction) ([place])   . Constipation 03/03/2017  . Fever   . Abdominal distention   . Acute delirium   . Hyponatremia   . Acute respiratory failure ([place]) 02/26/2017  . Electrolyte imbalance 02/26/2017  . Hypokalemia 02/25/2017  . Intra-abdominal malignant neoplasm ([place])   . Ascites   . SOB (shortness of breath)   . Cancer associated pain 02/21/2017  . Nausea   . Admission for chemotherapy   . Peritoneal carcinomatosis ([place])   . Metastatic cancer ([place])   . Sepsis ([place]) 02/14/2017  . Diffuse papular rash 08/01/2016  . Psoriasis 08/01/2016  . Chronic hepatitis B ([place])  08/01/2016  . Hepatitis, viral 03/01/2015  . Controlled type 2 diabetes mellitus without complication, without long-term current use of insulin ([place]) 03/01/2015  . Uncontrolled diabetes mellitus ([place]) 11/21/2014  . 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 Patient has had abdominal discomfort for past couple of months and underwent CT scanning at urgent care, which showed concerns for widespread metastatic disease. According to the daughter patient has not been seeing any physician and was diagnosed with diabetes mellitus type 2 about 6 months ago and has been on metformin. No other complaints including nausea, vomiting, unintentional weight loss.She has had poor appetite over the last 2 weeks and attributes it to less desire to eat. Previously patient has not had any screening procedures/imaging such as colonoscopy or mammography. Denies any family history of malignancies.   Assessment: SBO Sepsis/delirium multi factorial. pulm mets  Possible GB malignancy or Possible ovarian malignancy Recurrent malignant ascites.  Uncontrolled multi factorial pain.  Cancer cachexia/anorexia  Severe calorie/protein malnutrition   Recommendations/Plan:  Now DNR  Lethargy/respiratory failure:  In reviewing MAR, she had 2 doses of dilaudid (one 4mg  dose at 0108 and one 2mg  dose at 0812) in the 24 hours prior to rapid response.  Dilaudid already changed to 2mg  and would continue to be judicious with narcotics as concern if this is contributing to her lethargy and events of last evening.  I would not recommend stopping these medications (particularly pain medication) altogether.   Continueprn Dilaudid 2mg  POPRN for pain control.    Plan for TPN this weekend and  reassessment for potential benefit from further disease modifying therapy early next week.   Goals of Care and Additional Recommendations:  Limitations on Scope of Treatment: Full Scope Treatment  Code Status:  Code Status Orders  (From admission, onward)        Start     Ordered   02/27/17 1315  Full code  Continuous     02/27/17 1314    Code Status History    Date Active Date Inactive Code Status Order ID Comments User Context   02/26/2017 08:05 02/27/2017 13:14 DNR [id number]  Ardath Sax, MD Inpatient   02/14/2017 00:35 02/26/2017 08:05 Full Code [id number]  Damita Lack, MD ED      Prognosis:  Guarded with poor prognosis given widespread malignancy, deconditioning, and malnutrition  Discharge Planning:  To Be Determined  Care plan was discussed with bedside RN  Thank you for allowing the Palliative Medicine Team to assist in the care of this patient.  Greater than 50%  of this time was spent counseling and coordinating care related to the above assessment and plan.  Please contact Palliative Medicine Team phone at [phone] for questions and concerns.   Total time: 25 minutes Micheline Rough, MD [facility] Team [phone]

## 2017-03-15 LAB — GLUCOSE, CAPILLARY
GLUCOSE-CAPILLARY: 133 mg/dL — AB (ref 65–99)
GLUCOSE-CAPILLARY: 170 mg/dL — AB (ref 65–99)
Glucose-Capillary: 191 mg/dL — ABNORMAL HIGH (ref 65–99)
Glucose-Capillary: 210 mg/dL — ABNORMAL HIGH (ref 65–99)
Glucose-Capillary: 80 mg/dL (ref 65–99)

## 2017-03-15 LAB — COMPREHENSIVE METABOLIC PANEL
ALK PHOS: 91 U/L (ref 38–126)
ALT: 22 U/L (ref 14–54)
AST: 23 U/L (ref 15–41)
Albumin: 2.3 g/dL — ABNORMAL LOW (ref 3.5–5.0)
Anion gap: 6 (ref 5–15)
BUN: 32 mg/dL — AB (ref 6–20)
CALCIUM: 8.4 mg/dL — AB (ref 8.9–10.3)
CO2: 39 mmol/L — ABNORMAL HIGH (ref 22–32)
CREATININE: 0.55 mg/dL (ref 0.44–1.00)
Chloride: 90 mmol/L — ABNORMAL LOW (ref 101–111)
GFR calc non Af Amer: >60 mL/min (ref >60)
GLUCOSE: 191 mg/dL — AB (ref 65–99)
Potassium: 4.6 mmol/L (ref 3.5–5.1)
SODIUM: 135 mmol/L (ref 135–145)
Total Bilirubin: 0.2 mg/dL — ABNORMAL LOW (ref 0.3–1.2)
Total Protein: 5.9 g/dL — ABNORMAL LOW (ref 6.5–8.1)

## 2017-03-15 LAB — CBC WITH DIFFERENTIAL/PLATELET
Basophils Absolute: 0 10*3/uL (ref 0.0–0.1)
Basophils Relative: 0 %
EOS PCT: 0 %
Eosinophils Absolute: 0 10*3/uL (ref 0.0–0.7)
HCT: 36.3 % (ref 36.0–46.0)
HEMOGLOBIN: 10.6 g/dL — AB (ref 12.0–15.0)
Lymphocytes Relative: 7 %
Lymphs Abs: 1.3 10*3/uL (ref 0.7–4.0)
MCH: 25.4 pg — AB (ref 26.0–34.0)
MCHC: 29.2 g/dL — ABNORMAL LOW (ref 30.0–36.0)
MCV: 86.8 fL (ref 78.0–100.0)
MONO ABS: 0.6 10*3/uL (ref 0.1–1.0)
MONOS PCT: 3 %
NEUTROS PCT: 90 %
Neutro Abs: 16.9 10*3/uL — ABNORMAL HIGH (ref 1.7–7.7)
Platelets: 242 10*3/uL (ref 150–400)
RBC: 4.18 MIL/uL (ref 3.87–5.11)
RDW: 15.8 % — ABNORMAL HIGH (ref 11.5–15.5)
WBC: 18.8 10*3/uL — AB (ref 4.0–10.5)

## 2017-03-15 LAB — MAGNESIUM: Magnesium: 2.1 mg/dL (ref 1.7–2.4)

## 2017-03-15 LAB — PHOSPHORUS: Phosphorus: 2.1 mg/dL — ABNORMAL LOW (ref 2.5–4.6)

## 2017-03-15 MED ORDER — FUROSEMIDE 40 MG PO TABS
40.0000 mg | ORAL_TABLET | Freq: Every day | ORAL | Status: DC
  Administered 2017-03-15 – 2017-03-17 (×3): 40 mg via ORAL
  Filled 2017-03-15 (×3): qty 1

## 2017-03-15 MED ORDER — INSULIN ASPART 100 UNIT/ML ~~LOC~~ SOLN
0.0000 [IU] | SUBCUTANEOUS | Status: DC
  Administered 2017-03-15: 3 [IU] via SUBCUTANEOUS
  Administered 2017-03-15: 2 [IU] via SUBCUTANEOUS
  Administered 2017-03-15 – 2017-03-16 (×3): 5 [IU] via SUBCUTANEOUS

## 2017-03-15 MED ORDER — CLINIMIX E/DEXTROSE (5/15) 5 % IV SOLN
INTRAVENOUS | Status: AC
  Administered 2017-03-15: 18:00:00 via INTRAVENOUS
  Filled 2017-03-15 (×2): qty 720

## 2017-03-15 MED ORDER — FAT EMULSION 20 % IV EMUL
120.0000 mL | INTRAVENOUS | Status: AC
  Administered 2017-03-15: 120 mL via INTRAVENOUS
  Filled 2017-03-15: qty 120

## 2017-03-15 MED ORDER — K PHOS MONO-SOD PHOS DI & MONO 155-852-130 MG PO TABS
250.0000 mg | ORAL_TABLET | ORAL | Status: AC
  Administered 2017-03-15 (×2): 250 mg via ORAL
  Filled 2017-03-15 (×2): qty 1

## 2017-03-15 MED ORDER — SODIUM GLYCEROPHOSPHATE 1 MMOLE/ML IV SOLN
15.0000 mmol | Freq: Once | INTRAVENOUS | Status: DC
  Filled 2017-03-15: qty 15

## 2017-03-15 NOTE — Progress Notes (Addendum)
PHARMACY - ADULT TOTAL PARENTERAL NUTRITION CONSULT NOTE   Pharmacy Consult for TPN Indication: partial bowel obstruction   Patient Measurements: Height: 4' 10.5" (148.6 cm) Weight: 111 lb 15.9 oz (50.8 kg) IBW/kg (Calculated) : 42.05 TPN AdjBW (KG): 45.2 Body mass index is 23.01 kg/m.  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  Patient with partial bowel obstruction and inadequate oral intake, enteral tube placement not an option per discussion with oncologist.  Unable to proceed with second cycle of chemotherapy due to severe weakness.  Will begin TPN per oncology to improve nutritional status.  Significant events:  2/14 TPN started briefly in evening.  Patient with lethargy, AMS, decreased respirations. TPN stopped.   2/15 Patient mental status improved, resume TPN per oncology. Paracentesis by IR removed 2.3L.  Today, 03/15/2017:   Glucose - h/o DM, Hgb A1c 9.4 on 02/14/2017. CBGs elevated with TPN start.    Electrolytes - K wnl but trended up, Phos decreased, Mag wnl, CorrCa 9.8.  Renal - SCr WNL  LFTs - WNL  TGs - pending  Prealbumin - pending  NUTRITIONAL GOALS                                                                                             RD recs: 80-92 grams protein (1.5-1.7 grams/kg), 1790-1950 (33-36 kcal/kg) Clinimix E 5/15 at a goal rate of 75 ml/hr over 24 hours + 20% fat emulsion at 20 ml/hr over 12 hours to provide: 90 g/day protein, 1758 Kcal/day.   PLAN                                                                                                                         Replace  electrolytes if needed based on results of today's  labs.  At 1800 today:  Increase Clinimix E 5/15 slightly to 40 ml/hr. Being conservative due to refeeding risk.  Continue 20% fat emulsion at 10 ml/hr over 12 hours.  Plan to advance as tolerated to the goal rate.  Continue liquid multivitamin with elements daily.  Continue CBG checks and increase to Moderate SSI q4h.  Give NaPhos bolus today -> d/t solitary IV access, changed to oral Phos.   TPN lab panels on Mondays & Thursdays. TPN labs tomorrow AM.  F/u daily.  Romeo Rabon, PharmD. Mobile: [phone]. 03/15/2017,7:51 AM.

## 2017-03-15 NOTE — Progress Notes (Signed)
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 S/P paracentesis yday 2.4 L out, felt a little better but belly still hurts Because of obtundation day before, pain meds are being carefull administered She had a bowel movement today  OBJECTIVE: REVIEW OF SYSTEMS:   Constitutional: Sitting in chair Eyes: Denies blurriness of vision Ears, nose, mouth, throat, and face: Denies mucositis or sore throat Respiratory: SOB Cardiovascular: Denies palpitation, chest discomfort Gastrointestinal:  Abd pain Skin: Denies abnormal skin rashes Neurological:gen weaknesses Behavioral/Psych: Mood is stable, no new changes  Extremities: Bil lower extremity edema All other systems were reviewed with the patient and are negative.   PHYSICAL EXAMINATION: ECOG PERFORMANCE STATUS: 3 - Symptomatic, >50% confined to bed  Vitals:   03/14/17 2110 03/15/17 0425  BP: (!) 99/58 (!) 105/57  Pulse: 98 (!) 105  Resp: 16 15  Temp: 97.8 F (36.6 C) 97.6 F (36.4 C)  SpO2: 99% 99%   Filed Weights   03/09/17 0500 03/10/17 0439 03/14/17 0802  Weight: 113 lb 15.7 oz (51.7 kg) 112 lb (50.8 kg) 111 lb 15.9 oz (50.8 kg)    GENERAL:alert, no distress and comfortable SKIN: skin color, texture, turgor are normal, no rashes or significant lesions EYES: normal, Conjunctiva are pink and non-injected, sclera clear OROPHARYNX:no exudate, no erythema and lips, buccal mucosa, and tongue normal  LUNGS: Dec BS at bases HEART: regular rate & rhythm and no murmurs and no lower extremity edema ABDOMEN:tender Musculoskeletal:no cyanosis of digits and no clubbing  NEURO: alert & oriented x 3 with fluent speech, no focal motor/sensory deficits  LABORATORY DATA:  I have reviewed the data as listed CMP Latest Ref Rng & Units 03/14/2017 03/12/2017 03/11/2017  Glucose 65 - 99 mg/dL 155(H) 121(H) 161(H)  BUN 6 - 20 mg/dL 34(H) 26(H) 24(H)  Creatinine 0.44 - 1.00 mg/dL 0.63  0.69 0.66  Sodium 135 - 145 mmol/L 136 137 135  Potassium 3.5 - 5.1 mmol/L 4.2 4.5 4.4  Chloride 101 - 111 mmol/L 89(L) 91(L) 90(L)  CO2 22 - 32 mmol/L 35(H) 32 32  Calcium 8.9 - 10.3 mg/dL 8.1(L) 8.5(L) 8.2(L)  Total Protein 6.5 - 8.1 g/dL 5.7(L) 5.9(L) 6.2(L)  Total Bilirubin 0.3 - 1.2 mg/dL 0.4 0.7 0.6  Alkaline Phos 38 - 126 U/L 80 88 89  AST 15 - 41 U/L _0 ALT 14 - 54 U/L _1 Lab Results  Component Value Date   WBC 20.1 (H) 03/14/2017   HGB 9.9 (L) 03/14/2017   HCT 33.4 (L) 03/14/2017   MCV 86.5 03/14/2017   PLT 225 03/14/2017   NEUTROABS 18.5 (H) 03/14/2017    ASSESSMENT AND PLAN: 1. Met [place] ca likely prim peritoneal cancer with liver and lung mets: Dr.Perlov plans chemo next week 2. Abd pain with SBO/ileus: had a BM today 3. Hypoalbuminemia: causing LE edema. On TPN 4. Malignant ascites: Yday paracentesis 2.4 L felt better after 5. Pain: on Dilaudid 2 mg PO every 3 hrs. 6. DM 7. Anemia and leucocytosis 8. DNR Labs tomorrow

## 2017-03-16 DIAGNOSIS — C482 Malignant neoplasm of peritoneum, unspecified: Secondary | ICD-10-CM

## 2017-03-16 DIAGNOSIS — L8990 Pressure ulcer of unspecified site, unspecified stage: Secondary | ICD-10-CM

## 2017-03-16 DIAGNOSIS — R109 Unspecified abdominal pain: Secondary | ICD-10-CM

## 2017-03-16 DIAGNOSIS — R52 Pain, unspecified: Secondary | ICD-10-CM

## 2017-03-16 DIAGNOSIS — R739 Hyperglycemia, unspecified: Secondary | ICD-10-CM

## 2017-03-16 LAB — CBC WITH DIFFERENTIAL/PLATELET
Basophils Absolute: 0 10*3/uL (ref 0.0–0.1)
Basophils Relative: 0 %
EOS ABS: 0 10*3/uL (ref 0.0–0.7)
Eosinophils Relative: 0 %
HEMATOCRIT: 33.7 % — AB (ref 36.0–46.0)
HEMOGLOBIN: 10.5 g/dL — AB (ref 12.0–15.0)
LYMPHS ABS: 0.6 10*3/uL — AB (ref 0.7–4.0)
LYMPHS PCT: 4 %
MCH: 26.1 pg (ref 26.0–34.0)
MCHC: 31.2 g/dL (ref 30.0–36.0)
MCV: 83.6 fL (ref 78.0–100.0)
Monocytes Absolute: 1.3 10*3/uL — ABNORMAL HIGH (ref 0.1–1.0)
Monocytes Relative: 8 %
NEUTROS ABS: 14.6 10*3/uL — AB (ref 1.7–7.7)
NEUTROS PCT: 88 %
Platelets: 221 10*3/uL (ref 150–400)
RBC: 4.03 MIL/uL (ref 3.87–5.11)
RDW: 15.8 % — ABNORMAL HIGH (ref 11.5–15.5)
WBC: 16.6 10*3/uL — AB (ref 4.0–10.5)

## 2017-03-16 LAB — GLUCOSE, CAPILLARY
GLUCOSE-CAPILLARY: 228 mg/dL — AB (ref 65–99)
GLUCOSE-CAPILLARY: 60 mg/dL — AB (ref 65–99)
GLUCOSE-CAPILLARY: 74 mg/dL (ref 65–99)
GLUCOSE-CAPILLARY: 89 mg/dL (ref 65–99)
Glucose-Capillary: 126 mg/dL — ABNORMAL HIGH (ref 65–99)
Glucose-Capillary: 211 mg/dL — ABNORMAL HIGH (ref 65–99)
Glucose-Capillary: 248 mg/dL — ABNORMAL HIGH (ref 65–99)
Glucose-Capillary: 73 mg/dL (ref 65–99)

## 2017-03-16 LAB — COMPREHENSIVE METABOLIC PANEL
ALK PHOS: 80 U/L (ref 38–126)
ALT: 25 U/L (ref 14–54)
AST: 24 U/L (ref 15–41)
Albumin: 1.9 g/dL — ABNORMAL LOW (ref 3.5–5.0)
Anion gap: 7 (ref 5–15)
BILIRUBIN TOTAL: 0.2 mg/dL — AB (ref 0.3–1.2)
BUN: 32 mg/dL — ABNORMAL HIGH (ref 6–20)
CALCIUM: 8.1 mg/dL — AB (ref 8.9–10.3)
CO2: 38 mmol/L — AB (ref 22–32)
CREATININE: 0.55 mg/dL (ref 0.44–1.00)
Chloride: 89 mmol/L — ABNORMAL LOW (ref 101–111)
GFR calc non Af Amer: >60 mL/min (ref >60)
Glucose, Bld: 258 mg/dL — ABNORMAL HIGH (ref 65–99)
Potassium: 4 mmol/L (ref 3.5–5.1)
SODIUM: 134 mmol/L — AB (ref 135–145)
TOTAL PROTEIN: 5.5 g/dL — AB (ref 6.5–8.1)

## 2017-03-16 LAB — MAGNESIUM: Magnesium: 1.9 mg/dL (ref 1.7–2.4)

## 2017-03-16 LAB — PHOSPHORUS: PHOSPHORUS: 2.8 mg/dL (ref 2.5–4.6)

## 2017-03-16 MED ORDER — GLUCAGON HCL RDNA (DIAGNOSTIC) 1 MG IJ SOLR
1.0000 mg | Freq: Once | INTRAMUSCULAR | Status: AC
  Administered 2017-03-16: 1 mg via INTRAMUSCULAR

## 2017-03-16 MED ORDER — INSULIN REGULAR HUMAN 100 UNIT/ML IJ SOLN
INTRAVENOUS | Status: AC
  Administered 2017-03-16: 18:00:00 via INTRAVENOUS
  Filled 2017-03-16 (×2): qty 720

## 2017-03-16 MED ORDER — DEXTROSE 50 % IV SOLN: 25.0000 mL | Freq: Once | INTRAVENOUS | Status: DC

## 2017-03-16 MED ORDER — INSULIN ASPART 100 UNIT/ML ~~LOC~~ SOLN
0.0000 [IU] | SUBCUTANEOUS | Status: DC
  Administered 2017-03-17: 3 [IU] via SUBCUTANEOUS
  Administered 2017-03-18 (×2): 5 [IU] via SUBCUTANEOUS

## 2017-03-16 MED ORDER — INSULIN GLARGINE 100 UNIT/ML ~~LOC~~ SOLN
5.0000 [IU] | Freq: Once | SUBCUTANEOUS | Status: AC
  Administered 2017-03-16: 5 [IU] via SUBCUTANEOUS
  Filled 2017-03-16: qty 0.05

## 2017-03-16 MED ORDER — INSULIN ASPART 100 UNIT/ML ~~LOC~~ SOLN
0.0000 [IU] | SUBCUTANEOUS | Status: DC
  Administered 2017-03-16: 7 [IU] via SUBCUTANEOUS
  Administered 2017-03-16: 3 [IU] via SUBCUTANEOUS

## 2017-03-16 MED ORDER — BACLOFEN 10 MG PO TABS
10.0000 mg | ORAL_TABLET | Freq: Three times a day (TID) | ORAL | Status: DC
  Administered 2017-03-16 – 2017-03-17 (×6): 10 mg via ORAL
  Filled 2017-03-16 (×6): qty 1

## 2017-03-16 MED ORDER — POTASSIUM CHLORIDE CRYS ER 10 MEQ PO TBCR
10.0000 meq | EXTENDED_RELEASE_TABLET | Freq: Four times a day (QID) | ORAL | Status: AC
  Administered 2017-03-16 (×2): 10 meq via ORAL
  Filled 2017-03-16 (×2): qty 1

## 2017-03-16 MED ORDER — GLUCAGON HCL RDNA (DIAGNOSTIC) 1 MG IJ SOLR: 1.0000 mg | Freq: Once | INTRAMUSCULAR | Status: DC

## 2017-03-16 MED ORDER — FAT EMULSION 20 % IV EMUL
120.0000 mL | INTRAVENOUS | Status: AC
  Administered 2017-03-16: 120 mL via INTRAVENOUS
  Filled 2017-03-16: qty 200

## 2017-03-16 MED ORDER — GLUCAGON HCL RDNA (DIAGNOSTIC) 1 MG IJ SOLR: 1.0000 mg | Freq: Once | INTRAMUSCULAR | Status: DC | PRN

## 2017-03-16 NOTE — Progress Notes (Signed)
Hypoglycemic Event  CBG: 60  Treatment: Glucagon IM 1 mg  Symptoms: None and decreased response to pain stimuli  Follow-up CBG: Time:1755 CBG Result:74  Possible Reasons for Event: Medication regimen: insulin  Comments/MD notified: Dr Matthias Hughs, Hassan Rowan

## 2017-03-16 NOTE — Progress Notes (Signed)
Patient transferred back to bed. Bed alarm activated, call bell within reach and husband at bedside.

## 2017-03-16 NOTE — Progress Notes (Signed)
Rapid Response called for second opinion given patients decreased response rate and previous history of decreased responsiveness requiring transfer to the ICU.  Patient did have a cbg reading of 60 for which she was given glucagon injection IM with a cbg response of 74.  However, patient remained mostly sedated and had decreased response to pain stimuli.  VSS.  MD notified and orders obtained to d/c ativan which MD and RN suspect may have contributed to this event.  Per MD no other interventions at this time, will continue to monitor pt status.

## 2017-03-16 NOTE — Progress Notes (Signed)
HEMATOLOGY-ONCOLOGY PROGRESS NOTE  SUBJECTIVE:Continuing problems with abd pain, it comes as cramps making it difficult to lie down Decubitus ulcer making it difficult to sit. TPN for nutrition Leg swelling  Daughter at bedside for translation  OBJECTIVE: REVIEW OF SYSTEMS:   Constitutional: Denies fevers, chills or abnormal weight loss Eyes: Denies blurriness of vision Ears, nose, mouth, throat, and face: unable to eat Respiratory: SOB Cardiovascular: Denies palpitation, chest discomfort Gastrointestinal:  Abd distention and pain Neurological:Denies numbness, tingling or new weaknesses Behavioral/Psych: sad and depressed Extremities: severe lower extremity edema All other systems were reviewed with the patient and are negative.  I have reviewed the past medical history, past surgical history, social history and family history with the patient and they are unchanged from previous note.   PHYSICAL EXAMINATION: ECOG PERFORMANCE STATUS: 2 - Symptomatic, <50% confined to bed  Vitals:   03/15/17 2139 03/16/17 0620  BP: 101/60 101/67  Pulse: (!) 129 (!) 121  Resp: 18 16  Temp: 98 F (36.7 C) 98.5 F (36.9 C)  SpO2: 97% 100%   Filed Weights   03/09/17 0500 03/10/17 0439 03/14/17 0802  Weight: 113 lb 15.7 oz (51.7 kg) 112 lb (50.8 kg) 111 lb 15.9 oz (50.8 kg)    GENERAL:alert, no distress and comfortable SKIN: skin color, texture, turgor are normal, no rashes or significant lesions EYES: normal, Conjunctiva are pink and non-injected, sclera clear OROPHARYNX:No thrush NECK: supple, thyroid normal size, non-tender, without nodularity LUNGS: clear to auscultation and percussion with normal breathing effort HEART: regular rate & rhythm and no murmurs and no lower extremity edema ABDOMEN:tender Musculoskeletal:no cyanosis of digits and no clubbing  NEURO: alert & oriented x 3   LABORATORY DATA:  I have reviewed the data as listed CMP Latest Ref Rng & Units 03/16/2017  03/15/2017 03/14/2017  Glucose 65 - 99 mg/dL 258(H) 191(H) 155(H)  BUN 6 - 20 mg/dL 32(H) 32(H) 34(H)  Creatinine 0.44 - 1.00 mg/dL 0.55 0.55 0.63  Sodium 135 - 145 mmol/L 134(L) 135 136  Potassium 3.5 - 5.1 mmol/L 4.0 4.6 4.2  Chloride 101 - 111 mmol/L 89(L) 90(L) 89(L)  CO2 22 - 32 mmol/L 38(H) 39(H) 35(H)  Calcium 8.9 - 10.3 mg/dL 8.1(L) 8.4(L) 8.1(L)  Total Protein 6.5 - 8.1 g/dL 5.5(L) 5.9(L) 5.7(L)  Total Bilirubin 0.3 - 1.2 mg/dL 0.2(L) 0.2(L) 0.4  Alkaline Phos 38 - 126 U/L 80 91 80  AST 15 - 41 U/L 24 23 20   ALT 14 - 54 U/L 25 22 23     Lab Results  Component Value Date   WBC 16.6 (H) 03/16/2017   HGB 10.5 (L) 03/16/2017   HCT 33.7 (L) 03/16/2017   MCV 83.6 03/16/2017   PLT 221 03/16/2017   NEUTROABS 14.6 (H) 03/16/2017    ASSESSMENT AND PLAN: 1. Metastatic adenocarcinoma (primary peritoneal carcinoma): with lung and liver mets 2. Abd cramps: Adding Baclofen to see if it helps 3. Patient wishes to ambulate but PT has not come around much according to her family and nurisng is worried about her being a fall risk and so shes not had much PT 4. Leg edema: due to hypoalbuminemia: On TPN 5. Hyperglycemia 6. Decubitus ulcer: Will consult wound care 7. Pain: Dilaudid PO  Dr.Perlov will assume care tomorrow

## 2017-03-16 NOTE — Progress Notes (Signed)
Patient's family got her out of bed and transferred her to chair without the help of nurse or NT. All effort to get her to lay back in bed for the night was futile. Patient spent the night on the chair/recliner.

## 2017-03-16 NOTE — Progress Notes (Signed)
Patient is still insisting on lying in chair. Husband is at her bedside and can't convince her either to get in bed. Nurse and NT helped reposition her in the chair as possible to get pressure off her sacrum. Patient was also reminded to use her call bell to call on staff when she needs to get up or void. Chair alarm activated and call bell within reach.

## 2017-03-16 NOTE — Progress Notes (Signed)
Called to see patient regarding decreased LOC and possible need for Narcan.Pt had received po Dilaudid at 1012 this AM.  Pt was very lethargic but does respond and will follow commands (pt does not speak [place], family at bedside and interpertering for pt).  Pt received  glucagon for low BS earlier and is now more alert.  02 sats 98% on 2l, BP 110/78.  MD contacted by bedside RN, will continue to monitor on floor.  Family at bedside. No rapid response protocol orders needed at this time.

## 2017-03-16 NOTE — Progress Notes (Signed)
Nurse and NT offered to transfer patient from chair back to bed for the night but she refused and insisted on sitting in the recliner/chair. This nurse tried to explain and educate patient that this was not good for the pressure ulcer on her buttocks and that lying in bed would aid repositioning and getting her off her sacrum but she still refused. Charge nurse was notified of effort.

## 2017-03-16 NOTE — Progress Notes (Signed)
PHARMACY - ADULT TOTAL PARENTERAL NUTRITION CONSULT NOTE   Pharmacy Consult for TPN Indication: partial bowel obstruction   Patient Measurements: Height: 4' 10.5" (148.6 cm) Weight: 111 lb 15.9 oz (50.8 kg) IBW/kg (Calculated) : 42.05 TPN AdjBW (KG): 45.2 Body mass index is 23.01 kg/m.  Insulin Requirements: 20 units SSI the last 24 hrs  Current Nutrition: full liquid diet, poor intake  IVF: none  Central access: PICC single lumen placed 02/21/17 TPN start date: 03/13/17  ASSESSMENT                                                                                                          HPI: 60 yoF with metastatic adenocarcinoma, likely primary peritoneal with liver and lung metastasis.  Patient with partial bowel obstruction and inadequate oral intake, enteral tube placement not an option per discussion with oncologist.  Unable to proceed with second cycle of chemotherapy due to severe weakness.  Will begin TPN per oncology to improve nutritional status.  Significant events:  2/14 TPN started briefly in evening.  Patient with lethargy, AMS, decreased respirations. TPN stopped.   2/15 Patient mental status improved, resume TPN per oncology. Paracentesis by IR removed 2.3L.  Today, 03/16/2017:   Glucose - h/o DM, Hgb A1c 9.4 on 01/29/2017. CBGs up to 258 despite Mod SSI. Was on Metformin PTA.    Electrolytes - K wnl but trended down, Phos improved after oral suppl yesterday, Mag wnl, CorrCa wnl.  Renal - SCr WNL  LFTs - WNL  TGs - pending  Prealbumin - pending  NUTRITIONAL GOALS                                                                                             RD recs: 80-92 grams protein (1.5-1.7 grams/kg), 1790-1950 (33-36 kcal/kg) Clinimix E 5/15 at a goal rate of 75 ml/hr over 24 hours + 20% fat emulsion at 20 ml/hr over 12 hours to provide: 90 g/day protein, 1758 Kcal/day.   PLAN  At 1800 today:  Continue Clinimix E 5/15 at 40 ml/hr.   Continue 20% fat emulsion at 10 ml/hr over 12 hours.  Advance conservatively d/t hyperglycemia and risk of refeeding syndrome.  Continue liquid multivitamin with elements daily.  Add insulin to TPN, ~10 units/day  Give Lantus 5 units x 1 today.  Increase to Resistant SSI q4h.  Give oral KCl today in anticipation of it dropping with insulin.  TPN lab panels on Mondays & Thursdays.  F/u daily.  Romeo Rabon, PharmD. Mobile: [phone]. 03/16/2017,8:47 AM.

## 2017-03-17 LAB — CBC WITH DIFFERENTIAL/PLATELET
BASOS ABS: 0 10*3/uL (ref 0.0–0.1)
BASOS PCT: 0 %
EOS ABS: 0 10*3/uL (ref 0.0–0.7)
Eosinophils Relative: 0 %
HCT: 35.9 % — ABNORMAL LOW (ref 36.0–46.0)
Hemoglobin: 10.8 g/dL — ABNORMAL LOW (ref 12.0–15.0)
Lymphocytes Relative: 4 %
Lymphs Abs: 0.7 10*3/uL (ref 0.7–4.0)
MCH: 25.4 pg — ABNORMAL LOW (ref 26.0–34.0)
MCHC: 30.1 g/dL (ref 30.0–36.0)
MCV: 84.3 fL (ref 78.0–100.0)
MONO ABS: 1.8 10*3/uL — AB (ref 0.1–1.0)
MONOS PCT: 11 %
NEUTROS PCT: 85 %
Neutro Abs: 13.9 10*3/uL — ABNORMAL HIGH (ref 1.7–7.7)
Platelets: 228 10*3/uL (ref 150–400)
RBC: 4.26 MIL/uL (ref 3.87–5.11)
RDW: 15.8 % — AB (ref 11.5–15.5)
WBC: 16.3 10*3/uL — ABNORMAL HIGH (ref 4.0–10.5)

## 2017-03-17 LAB — GLUCOSE, CAPILLARY
GLUCOSE-CAPILLARY: 75 mg/dL (ref 65–99)
Glucose-Capillary: 106 mg/dL — ABNORMAL HIGH (ref 65–99)
Glucose-Capillary: 110 mg/dL — ABNORMAL HIGH (ref 65–99)
Glucose-Capillary: 191 mg/dL — ABNORMAL HIGH (ref 65–99)
Glucose-Capillary: 204 mg/dL — ABNORMAL HIGH (ref 65–99)
Glucose-Capillary: 89 mg/dL (ref 65–99)

## 2017-03-17 LAB — COMPREHENSIVE METABOLIC PANEL
ALBUMIN: 2 g/dL — AB (ref 3.5–5.0)
ALT: 28 U/L (ref 14–54)
ANION GAP: 8 (ref 5–15)
AST: 31 U/L (ref 15–41)
Alkaline Phosphatase: 88 U/L (ref 38–126)
BUN: 34 mg/dL — ABNORMAL HIGH (ref 6–20)
CHLORIDE: 92 mmol/L — AB (ref 101–111)
CO2: 38 mmol/L — AB (ref 22–32)
Calcium: 8.2 mg/dL — ABNORMAL LOW (ref 8.9–10.3)
Creatinine, Ser: 0.49 mg/dL (ref 0.44–1.00)
GFR calc Af Amer: >60 mL/min (ref >60)
GFR calc non Af Amer: >60 mL/min (ref >60)
GLUCOSE: 103 mg/dL — AB (ref 65–99)
POTASSIUM: 4.5 mmol/L (ref 3.5–5.1)
Sodium: 138 mmol/L (ref 135–145)
Total Bilirubin: 0.3 mg/dL (ref 0.3–1.2)
Total Protein: 5.6 g/dL — ABNORMAL LOW (ref 6.5–8.1)

## 2017-03-17 LAB — PHOSPHORUS: Phosphorus: 3.2 mg/dL (ref 2.5–4.6)

## 2017-03-17 LAB — PREALBUMIN

## 2017-03-17 LAB — TRIGLYCERIDES: Triglycerides: 141 mg/dL (ref <150)

## 2017-03-17 LAB — MAGNESIUM: Magnesium: 2 mg/dL (ref 1.7–2.4)

## 2017-03-17 MED ORDER — FAT EMULSION 20 % IV EMUL
240.0000 mL | INTRAVENOUS | Status: AC
  Administered 2017-03-17: 240 mL via INTRAVENOUS
  Filled 2017-03-17: qty 250

## 2017-03-17 MED ORDER — GERHARDT'S BUTT CREAM
TOPICAL_CREAM | Freq: Four times a day (QID) | CUTANEOUS | Status: DC
  Administered 2017-03-17 (×3): via TOPICAL
  Filled 2017-03-17: qty 1

## 2017-03-17 MED ORDER — MELATONIN 1 MG PO TABS
1.0000 mg | ORAL_TABLET | Freq: Once | ORAL | Status: AC
  Administered 2017-03-18: 1 mg via ORAL
  Filled 2017-03-17: qty 1

## 2017-03-17 MED ORDER — CLINIMIX E/DEXTROSE (5/15) 5 % IV SOLN
INTRAVENOUS | Status: DC
  Administered 2017-03-17: 17:00:00 via INTRAVENOUS
  Filled 2017-03-17: qty 1440

## 2017-03-17 NOTE — Progress Notes (Signed)
Occupational Therapy Treatment Patient Details Name: Chrisha Vogel MRN: [id number] DOB: 1950/05/30 Today's Date: 03/17/2017    History of present illness 67 y.o. female with medical history significant of history of diabetes mellitus type 2, hyperlipidemia was sent to the ER for further evaluation due to abnormal CT scan and and patient feeling generally ill.  Patient has had abdominal discomfort for past couple of months. 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 Has had 1 diagnostic paracentsis and one pallaitive paracentesis thus far   OT comments  Pt improved from last sx session; more awake, following commands. +2 safety as she had 2 buckling episodes last week.  Follow Up Recommendations  SNF    Equipment Recommendations  3 in 1 bedside commode    Recommendations for Other Services      Precautions / Restrictions Precautions Precautions: Fall Precaution Comments: watch O2 and HR  Restrictions Weight Bearing Restrictions: No       Mobility Bed Mobility               General bed mobility comments: Pt sitting EOB upon entering room   Transfers Overall transfer level: Needs assistance Equipment used: Rolling walker (2 wheeled) Transfers: Sit to/from Stand Sit to Stand: Mod assist;+2 physical assistance;+2 safety/equipment Stand pivot transfers: Mod assist;+2 physical assistance;+2 safety/equipment       General transfer comment: sit to stand x 4 trials with mod Assist to boost into standing and steady at RW, +2 for safety/assist due to Pt bil LE weakness     Balance Overall balance assessment: Needs assistance;History of Falls Sitting-balance support: Feet supported Sitting balance-Leahy Scale: Fair     Standing balance support: Bilateral upper extremity supported Standing balance-Leahy Scale: Poor Standing balance comment: UE support for balance and assist                            ADL either performed or assessed with clinical judgement   ADL                           Toilet Transfer: Moderate assistance;+2 for safety/equipment;Stand-pivot;BSC;RW;+2 for physical assistance   Toileting- Clothing Manipulation and Hygiene: Total assistance;+2 for safety/equipment;Sit to/from stand;+2 for physical assistance         General ADL Comments: used commode; RN changed dressing and transferred to recliner     Vision       Perception     Praxis      Cognition Arousal/Alertness: Lethargic Behavior During Therapy: Flat affect Overall Cognitive Status: Within Functional Limits for tasks assessed                                 General Comments: pt alert today, verbalized minimally with family who interpreted        Exercises     Shoulder Instructions       General Comments 2 pillows placed in chair due to compromised skin:  asked Unit secretary to order geomat cushion    Pertinent Vitals/ Pain       Pain Assessment: Faces Faces Pain Scale: Hurts whole lot Pain Location: abdomen Pain Descriptors / Indicators: Grimacing;Guarding Pain Intervention(s): Limited activity within patient's tolerance;Monitored during session;Patient requesting pain meds-RN notified  Home Living  Prior Functioning/Environment              Frequency           Progress Toward Goals  OT Goals(current goals can now be found in the care plan section)  Progress towards OT goals: Progressing toward goals(slowly)     Plan      Co-evaluation    PT/OT/SLP Co-Evaluation/Treatment: Yes Reason for Co-Treatment: For patient/therapist safety PT goals addressed during session: Mobility/safety with mobility OT goals addressed during session: ADL's and self-care      AM-PAC PT "6 Clicks" Daily Activity     Outcome Measure   Help from another person eating meals?: A  Little Help from another person taking care of personal grooming?: A Little Help from another person toileting, which includes using toliet, bedpan, or urinal?: A Lot Help from another person bathing (including washing, rinsing, drying)?: A Lot Help from another person to put on and taking off regular upper body clothing?: A Lot Help from another person to put on and taking off regular lower body clothing?: Total 6 Click Score: 13    End of Session Equipment Utilized During Treatment: Gait belt;Rolling walker;Oxygen      Activity Tolerance Patient tolerated treatment well   Patient Left in chair;with call bell/phone within reach;with family/visitor present   Nurse Communication          Time: 1610-9604 OT Time Calculation (min): 25 min  Charges: OT General Charges $OT Visit: 1 Visit OT Treatments $Self Care/Home Management : 8-22 mins  Lesle Chris, OTR/L [phone] 03/17/2017   Jazaria Jarecki 03/17/2017, 12:47 PM

## 2017-03-17 NOTE — Progress Notes (Addendum)
PHARMACY - ADULT TOTAL PARENTERAL NUTRITION CONSULT NOTE   Pharmacy Consult for TPN Indication: partial bowel obstruction   Patient Measurements: Height: 4' 10.5" (148.6 cm) Weight: 111 lb 15.9 oz (50.8 kg) IBW/kg (Calculated) : 42.05 TPN AdjBW (KG): 45.2 Body mass index is 23.01 kg/m.  Insulin Requirements: hypoglycemic event last pm w/ CBG 60>glucagon 1 mg IM, lantus 5 units given at 1013 am, 10 units SSI the last 24 hrs, has moderate SSI  Current Nutrition: full liquid diet, poor intake  IVF: none  Central access: PICC single lumen placed 02/21/17 TPN start date: 03/13/17  ASSESSMENT                                                                                                          HPI: 42 yoF with metastatic adenocarcinoma, likely primary peritoneal with liver and lung metastasis.  Patient with partial bowel obstruction and inadequate oral intake, enteral tube placement not an option per discussion with oncologist.  Unable to proceed with second cycle of chemotherapy due to severe weakness.  Will begin TPN per oncology to improve nutritional status.  Significant events:  2/14 TPN started briefly in evening.  Patient with lethargy, AMS, decreased respirations. TPN stopped.   2/15 Patient mental status improved, resume TPN per oncology. Paracentesis by IR removed 2.3L. 2/17 decr LOC, hypoglycemic event, glucagon given  Today, 03/17/2017:   Glucose - h/o DM, Hgb A1c 9.4 on 02/19/2017. CBGs up to 258 despite Mod SSI yesterday,  Was on Metformin PTA.  Given lantus 5 units Sunday at 10 am, CBG down to 60 requiring glucagon 2/17 at 6 pm; 10 units of insulin added to TPN yesterday, CBGs 74-89 after bag hung  Electrolytes - K 4.5 after 20 PO, all lytes WNL   Renal - SCr WNL. Lasix 40 po qday  LFTs - WNL  TGs - 141 2/18  Prealbumin - pending 2/18  NUTRITIONAL GOALS                                                                                             RD recs: 80-92 grams  protein (1.5-1.7 grams/kg), 1790-1950 (33-36 kcal/kg) Clinimix E 5/15 at a goal rate of 75 ml/hr over 24 hours + 20% fat emulsion at 20 ml/hr over 12 hours to provide: 90 g/day protein, 1758 Kcal/day.   PLAN  At 1800 today:  Increase Clinimix E 5/15 to 60 ml/hr.   increase 20% fat emulsion to 20 ml/hr over 12 hours. This will provide 72 gm protein and 1502 kcals (90% protein and 84%kcal goals)  Continue liquid multivitamin with elements daily. PO PPI  Remove 10 units insulin added yesterday   Resistant SSI q4h >> reduced back to moderate SSI last PM w/ hypoglycmic event  F/u amCMET, mag and phos ordered by MD TPN lab panels on Mondays & Thursdays.  Eudelia Bunch, Pharm.D. [phone] 03/17/2017 7:53 AM

## 2017-03-17 NOTE — Consult Note (Signed)
[facility] Nurse wound consult note Reason for Consult:Intertriginous dermatitis (moisture associated skin damage) to the intergluteal cleft.  "Kissing" sores/mirror image lesions measuring 3cm x 2.5cm x 0.1cm are painful and moist are not pressure in etiology. Wound type: Moisture Pressure Injury POA: NA Measurement:See above Wound SWN:IOEVO, yellow/red (yellow is inflammatory response) Drainage (amount, consistency, odor) serous in small to moderate amount Periwound:small area of periwound erythema Dressing procedure/placement/frequency: I will implement a compounded prescriptive topical therapy that is 1:1:1 hydrocortisone, zinc oxide and lotrimin. When improved, this can be managed with our house moisture barrier ointment.  A mattress replacement with low air loss feature is provided today, patient already has a pressure redistribution chair cushion. Noted are the events of earlier today (Rapid Response).  [facility] nursing team will not follow, but will remain available to this patient, the nursing and medical teams.  Please re-consult if needed. Thanks, Maudie Flakes, MSN, RN, [place], Arther Abbott  Pager# [phone]

## 2017-03-17 NOTE — Progress Notes (Signed)
IP PROGRESS NOTE  Subjective:  Over the weekend, patient had another episode of rapid response based on respiratory function with deterioration attributed to Lorazepam at this time.  This morning, patient is more awake, breathing well, but increased amount of pain in the abdomen.  Bowel output is improving.  Objective: Vital signs in last 24 hours: Blood pressure 109/72, pulse (!) 121, temperature 97.9 F (36.6 C), temperature source Oral, resp. rate 16, height 4' 10.5" (1.486 m), weight 111 lb 15.9 oz (50.8 kg), SpO2 97 %.  Intake/Output from previous day: 02/17 0701 - 02/18 0700 In: 458.3 [I.V.:458.3] Out: -   Physical Exam: Sitting up in the chair.  Awake, appears to be in significant amount of pain moaning and occasionally crying out in pain. HEENT: Anicteric sclera, moist mucous membranes Lungs: Decreased breath sounds bilaterally with expiratory wheezing and frequent nonproductive cough Cardiac: S1/S2, regular, no murmurs Abdomen: Stable abdominal distention with diffuse tenderness  Lymph nodes: No palpable lymphadenopathy in the cervical, supraclavicular, axillary, or inguinal regions Neurologic: Unable to evaluate today Skin: No rash, petechiae, or ecchymosis. Musculoskeletal: Bilateral lower extremity swelling.   Lab Results: Recent Labs    03/16/17 0500 03/17/17 0424  WBC 16.6* 16.3*  HGB 10.5* 10.8*  HCT 33.7* 35.9*  PLT 221 228    BMET Recent Labs    03/16/17 0500 03/17/17 0424  NA 134* 138  K 4.0 4.5  CL 89* 92*  CO2 38* 38*  GLUCOSE 258* 103*  BUN 32* 34*  CREATININE 0.55 0.49  CALCIUM 8.1* 8.2*    Lab Results  Component Value Date   CEA1 0.9 02/19/2017    Studies/Results: No results found.  Medications: I have reviewed the patient's current medications.  Assessment/Plan: 67 year old female who presented with abdominal pain and partial bowel obstruction with findings of widely metastatic malignancy including peritoneal carcinomatosis,  hepatic lesions, and multifocal bilateral pulmonary involvement.  Peritoneal fluid positive for presence of cells consistent with adenocarcinoma.    **Hypercarbic Respiratory Failure: Recurrent events, this is #3.  All events are attributable to attempts at improving pain control with increased opioid administration in 1 or 2 days prior to the event.  In this instance, patient has received several doses of hydromorphone 4 mg the day prior to the event.  Patient's respiratory function is very tenuous to a combination of lymphangitic infiltration by her malignancy, bilateral pleural effusions, and pulmonary edema component of anasarca due to low oncotic pressure due to hypoalbuminemia. --Avoid escalation of opioid therapy or co-administration of significant doses of opioids and benzodiazepines. -Patient's family finally agreed to DNR status which I fully support.  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  Nevertheless, distribution of the disease as well as elevation of CA 125 and normal CA19-9 with noncontributory immunohistochemical profile favor primary peritoneal or ovarian etiology. Received the initial chemotherapy with carboplatin AUC 5 + paclitaxel 125 mg/m Q21d on 02/21/17.  Repeat CT scan of the abdomen and pelvis demonstrates essentially stable findings.  Radiographic appearance is still very worrisome for possible primary gallbladder carcinoma based on the thickened gallbladder and appearance of direct hepatic invasion.  At this time, patient has received the first dose of systemic chemotherapy and no additional treatment will be administered until 03/14/17.  Between worsening bowel obstruction, rising leukocytosis, and lack of significant improvement in physical state of the patient, I am becoming less and less optimistic about the  likely outcome of the cancer management strategy we have pursued, nevertheless, patient and her family maintain interest in pursuing further systemic therapy. --No chemotherapy today. -Reviewed the current situation with the patient's daughter.  Our binary choice at this time is to proceed with comfort measures due to severe discomfort the patient is experiencing with abdominal pain versus proceeding with systemic chemotherapy tomorrow morning despite decreased performance status from last week.  In the latter setting, patient will likely end up suffering significant amount of discomfort as adequate pain control has been impossible without inducing significant respiratory suppression --On request of patient's daughter, will request second opinion from Dr. Alvy Bimler who graciously agreed to see the patient tomorrow AM  **SBO, partial/Ileus due to opioids/severe malnutrition: Recent CT of the abdomen pelvis demonstrates persistent small bowel distention consistent with at least partial obstruction.  In addition to that,: Appears distended with retention of stool which is likely due to opioid medications.  Patient started moving her bowels again yesterday.  No recurrence of vomiting suggesting improvement in the bowel function.  Acute urinary retention likely due to increased opioid dosing yesterday.  Foley catheter placed. --Continue gentle oral nutrition.  We will reserve initiation of TPN only for development of complete bowel obstruction if suctioning of the gastric contents is necessary. --Unfortunately, cannot use methylnaltrexone due to presence of bowel obstruction. -Continue gentle laxatives   --Senna/Docusate + Psyllum + Miralax QDay --Continue TPN on the patient today --as long as the patient stated that the goal of care is to receive systemic therapy for her malignancy, nutritional support needs to be delivered as oral intake is very inadequate continued survival.  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  Will reaccumulate rapidly until the cancer is addressed. Paracentesis 02/21/17, 02/24/17.  Persistent significant pain in the abdomen with increased bowel output.  No recurrence of significant ascites noted at this time.  Most of the abdominal distention is due to the partial bowel obstruction. --Consult to interventional radiology for possible therapeutic paracentesis today. -We will deliver albumin depending on the amount of peritoneal fluid removed  **Cancer-associated pain: Patient seems to be able to stay awake enough at this current medication levels with no uncontrolled pain.  Pain control is reasonably stable.  --Current pain regimen:     --Hydromorphone 2 mg oral every 3 hours as needed --Do not increase current opioid dosing as patient easily decompensates in her respiratory function due to suppression  **Diabetes mellitus: As expected, glucose is higher and now that patient has received glucocorticoids with her chemotherapy. --Continue SSI for now, may have to increase to higher grade scale  **Malnutrition, combined calorie/protein, severe: Severe decrease in oral intake due to peritoneal carcinomatosis and partial bowel obstruction with pain exacerbated by food intake.  Patient has been seen by dietitian.  Phosphorus level has stabilized, albumin is slightly improved compared to the prior days. --Continue diet and advance as tolerated -Start TPN to augment nutrition  **Electrolytes/Fluids:  Hyponatremia: Complex hyponatremia due to intravascular volume depletion, pain, nausea, metastatic disease in the liver.  Sodium has normalized  Hypokalemia: Potassium has dropped likely due to increased urine output and insufficient oral intake.  Replaced with IV KCl/KPhos, resolved without recurrence.  Improved potassium after replacement. --Continue furosemide 40 mg daily by mouth  **PPx:  --Continue pantoprazole for stress ulcer  prophylaxis  --Continue enoxaparin 40 mg subcu daily for DVT prophylaxis  **Code Status:  --DNR: Patient's family, fortunately, listened to care team and allowed [place]  to switch the code to DNR from the full code the patient was previously.  Attempting revival/CPR on this patient would be traumatic and ultimately futile considering her underlying diagnosis and degree of disability she has developed  **Disposition:  -Continue hospitalization at this time.  Patient is requiring significant physical, nutritional, and medical support which is impossible to deliver at home based on available assistance there.   LOS: 31 days   Ardath Sax, MD   03/17/2017, 1:53 PM

## 2017-03-17 NOTE — Progress Notes (Signed)
Physical Therapy Treatment Patient Details Name: Jessica Gregory MRN: [id number] DOB: 1950/04/18 Today's Date: 03/17/2017    History of Present Illness 67 y.o. female with medical history significant of history of diabetes mellitus type 2, hyperlipidemia was sent to the ER for further evaluation due to abnormal CT scan and and patient feeling generally ill.  Patient has had abdominal discomfort for past couple of months. 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 Has had 1 diagnostic paracentsis and one pallaitive paracentesis thus far    PT Comments    Pt more alert and able to participate in PT today. Sit to stand x 4 trials with mod assist of 2. Instructed pt/family in BLE exercises for strengthening.   Follow Up Recommendations  Supervision/Assistance - 24 hour;SNF     Equipment Recommendations  Rolling walker with 5" wheels;Wheelchair (measurements PT)    Recommendations for Other Services       Precautions / Restrictions Precautions Precautions: Fall Precaution Comments: watch O2 and HR  Restrictions Weight Bearing Restrictions: No    Mobility  Bed Mobility               General bed mobility comments: Pt sitting EOB upon entering room   Transfers Overall transfer level: Needs assistance Equipment used: Rolling walker (2 wheeled) Transfers: Sit to/from Stand Sit to Stand: Mod assist;+2 physical assistance;+2 safety/equipment Stand pivot transfers: +2 physical assistance;+2 safety/equipment;Min assist       General transfer comment: sit to stand x 4 trials with mod Assist to boost into standing and steady at RW, +2 for safety/assist due to Pt bil LE weakness; HR 113 with activity  Ambulation/Gait             General Gait Details: unable 2* fatigue from transfers   Stairs            Wheelchair Mobility    Modified Rankin (Stroke Patients Only)       Balance Overall  balance assessment: Needs assistance;History of Falls Sitting-balance support: Feet supported Sitting balance-Leahy Scale: Fair     Standing balance support: Bilateral upper extremity supported Standing balance-Leahy Scale: Poor Standing balance comment: UE support for balance and assist                            Cognition Arousal/Alertness: Lethargic Behavior During Therapy: Flat affect Overall Cognitive Status: Within Functional Limits for tasks assessed                                 General Comments: pt alert today, verbalized minimally with family who interpreted      Exercises General Exercises - Lower Extremity Ankle Circles/Pumps: AROM;AAROM;10 reps;Supine Short Arc Quad: AAROM;Both;10 reps;Supine Heel Slides: AAROM;10 reps;Both Hip ABduction/ADduction: AAROM;Both;10 reps;Supine    General Comments General comments (skin integrity, edema, etc.): 2 pillows placed in chair due to compromised skin:  asked Unit secretary to order geomat cushion      Pertinent Vitals/Pain Pain Assessment: Faces Faces Pain Scale: Hurts whole lot Pain Location: abdomen Pain Descriptors / Indicators: Grimacing;Guarding Pain Intervention(s): Limited activity within patient's tolerance;Monitored during session;Patient requesting pain meds-RN notified    Home Living                      Prior Function            PT  Goals (current goals can now be found in the care plan section) Acute Rehab PT Goals Patient Stated Goal: likes to garden, care for grandkids PT Goal Formulation: With family Time For Goal Achievement: 03/24/17 Potential to Achieve Goals: Fair Progress towards PT goals: Progressing toward goals    Frequency    Min 3X/week      PT Plan Discharge plan needs to be updated    Co-evaluation PT/OT/SLP Co-Evaluation/Treatment: Yes Reason for Co-Treatment: For patient/therapist safety PT goals addressed during session:  Mobility/safety with mobility OT goals addressed during session: ADL's and self-care      AM-PAC PT "6 Clicks" Daily Activity  Outcome Measure  Difficulty turning over in bed (including adjusting bedclothes, sheets and blankets)?: A Lot Difficulty moving from lying on back to sitting on the side of the bed? : Unable Difficulty sitting down on and standing up from a chair with arms (e.g., wheelchair, bedside commode, etc,.)?: Unable Help needed moving to and from a bed to chair (including a wheelchair)?: A Lot Help needed walking in hospital room?: Total Help needed climbing 3-5 steps with a railing? : Total 6 Click Score: 8    End of Session Equipment Utilized During Treatment: Gait belt;Oxygen Activity Tolerance: Patient tolerated treatment well Patient left: with call bell/phone within reach;with family/visitor present;with chair alarm set;in chair Nurse Communication: Mobility status PT Visit Diagnosis: Other abnormalities of gait and mobility (R26.89);Muscle weakness (generalized) (M62.81)     Time: 4268-3419 PT Time Calculation (min) (ACUTE ONLY): 25 min  Charges:  $Therapeutic Activity: 8-22 mins                    G Codes:          Philomena Doheny 03/17/2017, 12:56 PM [phone]

## 2017-03-18 LAB — COMPREHENSIVE METABOLIC PANEL
ALBUMIN: 1.9 g/dL — AB (ref 3.5–5.0)
ALK PHOS: 91 U/L (ref 38–126)
ALT: 34 U/L (ref 14–54)
AST: 31 U/L (ref 15–41)
Anion gap: 9 (ref 5–15)
BUN: 43 mg/dL — ABNORMAL HIGH (ref 6–20)
CALCIUM: 8.1 mg/dL — AB (ref 8.9–10.3)
CHLORIDE: 91 mmol/L — AB (ref 101–111)
CO2: 37 mmol/L — ABNORMAL HIGH (ref 22–32)
CREATININE: 0.85 mg/dL (ref 0.44–1.00)
GFR calc non Af Amer: >60 mL/min (ref >60)
GLUCOSE: 192 mg/dL — AB (ref 65–99)
Potassium: 4.6 mmol/L (ref 3.5–5.1)
SODIUM: 137 mmol/L (ref 135–145)
Total Bilirubin: 0.3 mg/dL (ref 0.3–1.2)
Total Protein: 5.6 g/dL — ABNORMAL LOW (ref 6.5–8.1)

## 2017-03-18 LAB — CBC WITH DIFFERENTIAL/PLATELET
BASOS ABS: 0 10*3/uL (ref 0.0–0.1)
BASOS PCT: 0 %
EOS PCT: 0 %
Eosinophils Absolute: 0 10*3/uL (ref 0.0–0.7)
HCT: 35.3 % — ABNORMAL LOW (ref 36.0–46.0)
HEMOGLOBIN: 10.3 g/dL — AB (ref 12.0–15.0)
LYMPHS ABS: 1.5 10*3/uL (ref 0.7–4.0)
Lymphocytes Relative: 9 %
MCH: 25.6 pg — ABNORMAL LOW (ref 26.0–34.0)
MCHC: 29.2 g/dL — ABNORMAL LOW (ref 30.0–36.0)
MCV: 87.6 fL (ref 78.0–100.0)
MONO ABS: 0.8 10*3/uL (ref 0.1–1.0)
Monocytes Relative: 5 %
Neutro Abs: 14.3 10*3/uL — ABNORMAL HIGH (ref 1.7–7.7)
Neutrophils Relative %: 86 %
PLATELETS: 244 10*3/uL (ref 150–400)
RBC: 4.03 MIL/uL (ref 3.87–5.11)
RDW: 16.1 % — ABNORMAL HIGH (ref 11.5–15.5)
WBC: 16.6 10*3/uL — ABNORMAL HIGH (ref 4.0–10.5)

## 2017-03-18 LAB — GLUCOSE, CAPILLARY: GLUCOSE-CAPILLARY: 177 mg/dL — AB (ref 65–99)

## 2017-03-18 LAB — PHOSPHORUS: PHOSPHORUS: 6.3 mg/dL — AB (ref 2.5–4.6)

## 2017-03-18 LAB — MAGNESIUM: Magnesium: 2.2 mg/dL (ref 1.7–2.4)

## 2017-03-28 NOTE — Death Summary Note (Addendum)
Pt pump beeping went in to fix pump and pt appears not to be breathing. Listened to pt apical pulse, and had no heart beat. Pronounced at this time.   Second nurse Ishmael Holter, Rn at bedside , along with [facility].  Husband at bedside, daughter called and notified.

## 2017-03-28 NOTE — Discharge Summary (Signed)
Physician Discharge Summary of Deceased Patient   Patient ID: Jessica Gregory MRN: [id number] DOB/AGE: [birth year] 67 y.o.  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 Sepsis ([place])  Disposition:  Deceased  Significant Diagnostic Studies:  Dg Chest 2 View  Result Date: 03/12/2017 CLINICAL DATA:  Follow-up pleural effusion, shortness of breath, weakness, history diabetes mellitus, metastatic cancer/peritoneal carcinomatosis primary tumor not specified EXAM: CHEST  2 VIEW COMPARISON:  03/02/2017 FINDINGS: LEFT arm PICC line tip projects over cavoatrial junction. Enlargement of cardiac silhouette with vascular congestion. Diffuse pulmonary infiltrates consistent with pulmonary edema. Bibasilar effusions atelectasis, slightly increased. No pneumothorax. Bones demineralized. IMPRESSION: Increased pulmonary edema, bibasilar pleural effusions and bibasilar atelectasis. Electronically Signed   By: Lavonia Dana M.D.   On: 03/12/2017 11:22   Dg Abd 1 View  Result Date: 03/02/2017 CLINICAL DATA:  Abdominal distention. EXAM: ABDOMEN - 1 VIEW COMPARISON:  02/16/2017 FINDINGS: Examination is technically limited due to motion artifact. There appears to be gaseous distention of both small and large bowel. This is similar to the previous study. This is likely to indicate ileus. Enteritis less likely. IMPRESSION: Limited  examination due to motion artifact. There appears to be mild gaseous distention of small and large bowel most consistent with ileus. Enteritis would be a less likely consideration. Electronically Signed   By: Lucienne Capers M.D.   On: 03/02/2017 06:39   Ct Abdomen Pelvis W Contrast  Result Date: 03/02/2017 CLINICAL DATA:  Foreign body in urethra.  Stage IV cancer. EXAM: CT ABDOMEN AND PELVIS WITH CONTRAST TECHNIQUE: Multidetector CT imaging of the abdomen and pelvis was performed using the standard protocol following bolus administration of intravenous contrast. CONTRAST:  168mL ISOVUE-300 IOPAMIDOL (ISOVUE-300) INJECTION 61% COMPARISON:  Feb 25, 2017. FINDINGS: Lower chest: Innumerable pulmonary nodules/metastases. Mild patchy right lower lobe opacity, possibly atelectasis. Hepatobiliary: Abnormal appearance of the gallbladder fundus (series 2/image 34), suggesting gallbladder adenocarcinoma, with direct mass-like invasion of the adjacent hepatic parenchyma. Combined tumor involving the liver and gallbladder measures approximately 5.1 x 8.7 x 6.4 cm (series 2/image 32; coronal image 32). 2.1 cm hepatic metastasis in segment 8 (series 2/image 24). These findings are mildly progressive from recent CT. Pancreas: Within normal limits. Spleen: Within normal limits. Adrenals/Urinary Tract: Adrenal glands are within normal limits. 9 mm cyst in the posterior right lower kidney (series 2/image 43). Left kidney is within normal limits. No hydronephrosis. Bladder is within normal limits. Stomach/Bowel: Stomach is within normal limits. Dilated loops of small bowel in the right mid abdomen (series 2/image 70), with decompressed loops of terminal ileum (series 2/image 64), raising the possibility of small bowel obstruction. However, the colon is nondilated and in fact mildly distended and fluid-filled. As such, this overall appearance may reflect adynamic ileus, particularly if this patient is on narcotic pain medication.  Vascular/Lymphatic: No evidence of abdominal aortic aneurysm. Atherosclerotic calcifications of the abdominal aorta and branch vessels. Small upper abdominal lymph nodes. Widespread omental caking (for example, series 2/image 49). Scattered peritoneal implants, including a dominant 4.0 cm implant in the right mid abdomen (series 2/image 39).  Additional implants in the left upper abdomen anterior to the spleen. This appearance is grossly unchanged. Reproductive: Uterus is grossly unremarkable. No adnexal masses. Other: Moderate abdominopelvic ascites, partially loculated with mass effect beneath the right mid abdominal wall (series 2/image 45, reflecting malignant ascites. This appearance is grossly unchanged. A punctate radiodensity is present at the urethral meatus (series 2/image 95) and was also present on prior CT. This may be related to the patient's known plastic (non radiopaque) foreign body. However, this is not well visualized on CT. Musculoskeletal: Visualized osseous structures are within normal limits. IMPRESSION: Punctate radiodensity at the urethral meatus, possibly related to the patient's known plastic (non radiopaque) foreign body, not well visualized on CT. 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 Associated hepatic metastasis. Associated widespread peritoneal disease with omental caking and ascites, grossly unchanged. Dilated loops of small bowel in the right mid abdomen, favoring adynamic ileus due to lack of colonic decompression. Widespread pulmonary metastases, incompletely visualized. Electronically Signed   By: Julian Hy M.D.   On: 03/02/2017 14:03   US Paracentesis  Result Date: 03/14/2017 INDICATION: History of metastatic adenocarcinoma. Recurrent malignant ascites. Request for therapeutic paracentesis. EXAM: ULTRASOUND GUIDED LEFT LOWER QUADRANT PARACENTESIS MEDICATIONS: None. COMPLICATIONS: None immediate. PROCEDURE:  Informed written consent was obtained from the patient after a discussion of the risks, benefits and alternatives to treatment. A timeout was performed prior to the initiation of the procedure. Initial ultrasound scanning demonstrates a moderate amount of ascites within the left lower abdominal quadrant. The left lower abdomen was prepped and draped in the usual sterile fashion. 1% lidocaine with epinephrine was used for local anesthesia. Following this, a 19 gauge, 7-cm, Yueh catheter was introduced. An ultrasound image was saved for documentation purposes. The paracentesis was performed. The catheter was removed and a dressing was applied. The patient tolerated the procedure well without immediate post procedural complication. FINDINGS: A total of approximately 2.3 L of clear yellow fluid was removed. IMPRESSION: Successful ultrasound-guided paracentesis yielding 2.3 liters of peritoneal fluid. Read by: Ascencion Dike PA-C Electronically Signed   By: Corrie Mckusick D.O.   On: 03/14/2017 11:46   US Paracentesis  Result Date: 02/24/2017 INDICATION: Metastatic adenocarcinoma, 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 Request made for therapeutic paracentesis. EXAM: ULTRASOUND GUIDED THERAPEUTIC PARACENTESIS MEDICATIONS: 1% lidocaine local COMPLICATIONS: None immediate. PROCEDURE: Informed written consent was obtained from the patient after a discussion of the risks, benefits and alternatives to treatment. A timeout was performed prior to the initiation of the procedure. Initial ultrasound scanning demonstrates a small amount of ascites within the right mid to lower abdominal quadrant. The right mid to lower abdomen was prepped and draped in the usual sterile fashion. 1% lidocaine was used for local anesthesia. Following this, a Yueh catheter was introduced. An ultrasound image was saved for documentation purposes. The paracentesis was performed. The catheter was removed and a dressing was  applied. The patient tolerated the procedure well without immediate post procedural complication. FINDINGS: A total of approximately 920 cc of slightly hazy, amber fluid was removed. IMPRESSION: Successful ultrasound-guided therapeutic paracentesis yielding 920 cc of peritoneal fluid. Read by: Rowe Robert, PA-C Electronically Signed   By: Jerilynn Mages.  Shick M.D.   On: 02/24/2017 16:37   Dg Chest Port 1 View  Result Date: 03/02/2017 CLINICAL DATA:  Acute respiratory failure. EXAM: PORTABLE CHEST 1 VIEW COMPARISON:  02/26/2017. FINDINGS: Normal sized heart. Left PICC tip in the upper right atrium. Increased diffuse prominence  of the pulmonary vasculature and interstitial markings with small bilateral pleural effusions. No acute bony abnormality. IMPRESSION: Mild worsening of changes of congestive heart failure. Electronically Signed   By: Claudie Revering M.D.   On: 03/02/2017 11:18   Dg Chest Port 1 View  Result Date: 02/26/2017 CLINICAL DATA:  Shortness of breath. EXAM: PORTABLE CHEST 1 VIEW COMPARISON:  Radiographs of February 18, 2017. FINDINGS: The heart size and mediastinal contours are within normal limits. No pneumothorax is noted. Interval placement of left-sided PICC line with distal tip in expected position of cavoatrial junction. Stable bilateral patchy airspace opacities are noted in both lungs, particularly on the right, concerning for pneumonia. Small right pleural effusion is noted. The visualized skeletal structures are unremarkable. IMPRESSION: Stable bilateral patchy airspace opacities are noted concerning for multifocal pneumonia. Small right pleural effusion is noted. Interval placement of left-sided PICC line with distal tip in expected position of cavoatrial junction. Electronically Signed   By: Marijo Conception, M.D.   On: 02/26/2017 10:35   Dg Chest Port 1 View  Result Date: 02/18/2017 CLINICAL DATA:  Initial evaluation for acute shortness of breath with wheezing, abdominal distension. EXAM:  PORTABLE CHEST 1 VIEW COMPARISON:  Prior radiograph from 02/14/2017. FINDINGS: Examination technically limited by patient positioning and shallow inspiration. Patient markedly rotated to the right. Cardiac and mediastinal silhouettes are grossly stable and within normal limits. Lungs are hypoinflated. Extensive patchy multifocal opacities throughout the right lung as well as the left upper lobe, somewhat concerning for possible multifocal infiltrates/infection. Probable superimposed component of atelectasis at the right lung base. Suspected small layering right pleural effusion. No overt pulmonary edema. No pneumothorax. No acute osseous abnormality. Several prominent gas-filled loops of bowel partially visualize within the upper abdomen. IMPRESSION: 1. Patchy multifocal opacities involving the right lung as well as the left upper lobe, concerning for multifocal infiltrates/pneumonia. 2. Suspected small layering right pleural effusion. Electronically Signed   By: Jeannine Boga M.D.   On: 02/18/2017 22:45   Dg Abd Portable 1v  Result Date: 02/16/2017 CLINICAL DATA:  68 year old female with history of nausea, vomiting and abdominal pain with worsening shortness of breath. EXAM: PORTABLE ABDOMEN - 1 VIEW COMPARISON:  No prior abdominal radiograph. CT the abdomen and pelvis 02/15/2017. FINDINGS: Gas and stool are noted throughout the colon. No pathologic dilatation of small bowel or colon. Several nondilated gas-filled loops of small bowel are noted (nonspecific). No definite pneumoperitoneum noted on this single supine image. Retained contrast material is evident in the cecum. Faint calcifications projecting over the right upper quadrant corresponding to calcifications in known hepatic mass. IMPRESSION: 1. Nonobstructive bowel gas pattern. 2. No pneumoperitoneum. Electronically Signed   By: Vinnie Langton M.D.   On: 02/16/2017 09:01   Dg Abd Portable 2v  Result Date: 03/08/2017 CLINICAL DATA:   Metastatic adenocarcinoma.  Small-bowel obstruction EXAM: PORTABLE ABDOMEN - 2 VIEW COMPARISON:  CT 03/02/2017 FINDINGS: Dilated bowel loops in the abdomen and pelvis. This appears to be a combination of large and small bowel. This may reflect ileus or distal colonic obstruction. Scattered air-fluid levels. No free air organomegaly. IMPRESSION: Dilated large and small bowel loops, favor ileus. Cannot exclude distal colonic obstruction. Electronically Signed   By: Rolm Baptise M.D.   On: 03/08/2017 12:01   Ir Paracentesis  Result Date: 02/21/2017 INDICATION: Metastatic adenocarcinoma, 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 Request made for therapeutic paracentesis. EXAM: ULTRASOUND GUIDED THERAPEUTIC PARACENTESIS MEDICATIONS: None. COMPLICATIONS: None immediate. PROCEDURE: Informed written consent  was obtained from the patient after a discussion of the risks, benefits and alternatives to treatment. A timeout was performed prior to the initiation of the procedure. Initial ultrasound scanning demonstrates a small to moderate amount of ascites within the left mid to lower abdominal quadrant. The left mid to lower abdomen was prepped and draped in the usual sterile fashion. 1% lidocaine was used for local anesthesia. Following this, a Yueh catheter was introduced. An ultrasound image was saved for documentation purposes. The paracentesis was performed. The catheter was removed and a dressing was applied. The patient tolerated the procedure well without immediate post procedural complication. FINDINGS: A total of approximately 1.9 liters of yellow fluid was removed. IMPRESSION: Successful ultrasound-guided therapeutic paracentesis yielding 1.9 liters of peritoneal fluid. Read by: Rowe Robert, PA-C Electronically Signed   By: Aletta Edouard M.D.   On: 02/21/2017 15:26    Discharge Laboratory Values: CBC Latest Ref Rng & Units 04-02-17 03/17/2017 03/16/2017  WBC 4.0 - 10.5 K/uL 16.6(H)  16.3(H) 16.6(H)  Hemoglobin 12.0 - 15.0 g/dL 10.3(L) 10.8(L) 10.5(L)  Hematocrit 36.0 - 46.0 % 35.3(L) 35.9(L) 33.7(L)  Platelets 150 - 400 K/uL 244 228 221     Brief H and P: For complete details please refer to admission H and P, but in brief, Jessica Gregory was a 67 y.o. female with medical history significant of history of diabetes mellitus type 2, hyperlipidemia who was sent to the ER on 02/14/17 for further evaluation due to abnormal CT scan and and patient feeling generally ill.  Patient has had abdominal discomfort for the previous couple of months and due to worsening of this she was seen at [facility] clinic urgent care and underwent CT scanning which showed concerns for widespread metastatic disease therefore sent to the ER for further evaluation.  She is also been febrile, tachycardic with mild nonproductive cough. According to the daughter patient has not been seeing any physician and was diagnosed with diabetes mellitus type 2 about 6 months ago and has been on metformin.  No other complaints including nausea, vomiting, unintentional weight loss.  She has had poor appetite over the last 2 weeks and attributes it to less desire to eat.  Previously patient has not had any screening procedures/imaging such as colonoscopy or mammography.  Denies any family history of malignancies.  Patient denies any tobacco or alcohol use. In the ER patient was noted to be febrile when checked rectal temperature and chronically ill-appearing.  She was also slightly tachycardic at around 115.  Her UA was suggestive of urinary tract infection.  On physical exam she was noted to be very dehydrated.  CT of the chest abdomen pelvis reviewed showing widespread metastatic disease.  She was started on broad-spectrum antibiotics and it was determined to admit her for further care evaluation.  Physical Exam at Discharge: N/A  Hospital Course:  Active Problems:   Sepsis ([place])   Metastatic cancer ([place])   Peritoneal  carcinomatosis ([place])   Admission for chemotherapy   Cancer associated pain   Nausea   Ascites   SOB (shortness of breath)   Hypokalemia   Intra-abdominal malignant neoplasm ([facility])   Acute respiratory failure ([facility])   Electrolyte imbalance   Abdominal distention   Acute delirium   Hyponatremia   Constipation   Fever   SBO (small bowel obstruction) ([facility])   Protein-calorie malnutrition, severe   Pleural effusion  **Metastatic adenocarcinoma, likely primary peritoneal with liver and lung metastasis: Differential included cholangiocarcinoma or primary gallbladder adenocarcinoma based  on the dominant lesion in the liver. Peritoneal fluid cytology on 02/14/17 showed malignant cells positive with CK7 and MOC-31 consistent with adenocarcinoma & negative for S100, Melan A, SOX-10, CDX-2, CK20, CK5/6, Napsin A, TTF-1, and WT-1, estrogen receptor, progesterone receptor, GATA-3 and gross cystic disease fluid protein.  Distribution of the disease as well as elevation of CA-125 (514.1) and normal CA19-9 (34) with non-contributory immunohistochemical profile favored primary peritoneal or ovarian etiology.   After extensive discussion, patient has received the initial chemotherapy with carboplatin AUC 5 + paclitaxel 125 mg/m Q21d on 02/21/17.    Tolerated infusion reasonably well, but subsequently has developed progressive abdominal distention and required multiple episodes of paracentesis with some relief to the symptoms.  Patient continued to demonstrate partial obstructive symptoms of the small bowel with intermittent nausea and vomiting that eventually were controlled with medications.  Continue to have bowel movements for the most part of the hospitalization.  CT A/P obtained on 03/02/17 showed possible progression of the hepatic lesion with stable disease in the remainder of the abdomen and persistent small bowel obstruction and ileus.  Patient was due for the second cycle of systemic chemotherapy on  03/14/17, but due to deteriorating performance status, chemotherapy was held.  Due to malnutrition, patient was started on parenteral nutrition, with expectation of possible initiation of systemic chemotherapy if performance status has improved.  On 03/17/17, patient's clinical status remained unchanged and probably worsening with worsening pain control, anxiety, and agitation.  Patient was found unresponsive, pulseless, and without spontaneous respiration in the morning of 2017-03-23 and was pronounced dead by the nursing staff without resuscitation attempts based on pre-existing DNR request.  **Hypercarbic/Hypoxic Respiratory Failure: Recurrent events, with at least 4 episodes during.  Likely etiology was presence of diffuse infiltrative process in the lungs due to underlying malignancy, possible initial community-acquired pneumonia which was successfully treated with antibiotics, and side effects of medications including opioids and benzodiazepine leading to intermittent respiratory suppression and sedation.  With titration of the medications down, respiratory status was significantly better, but patient has intermittent respiratory distress.  In the past week preceding death, it has been agreement between treatment team and family members that the pain medications and benzodiazepines were to be minimized to reduce chances of recurrent rapid response calls as well as needed for intensification of respiratory support.  **SBO, partial/Ileus due to opioids/severe malnutrition: CTs of the abdomen pelvis demonstrated persistent small bowel distention consistent with at least partial obstruction.  In addition to that, large bowel appeared distended with retention of stool which is likely due to opioid medications.    Patient was started on a gentle bowel regimen with care exercise due to the obstructive physiology.  Patient was able to have some bowel output almost every day.  Nutrition has steadily deteriorated  throughout the admission, and TPN was started on 03/14/17 with no evidence of refeeding syndrome based on labs obtained days afterwards.  **Recurrent malignant ascites: Due to underlying malignancy characterized by rapid reaccumulation in the early weeks of hospitalization requiring several paracentesis procedures.  Accumulation decreased significantly in the weeks subsequent to chemotherapy administration.  **Cancer-associated pain: Severe pain mainly focused around the abdomen, likely combination of pain from direct malignancy invasion, and bowel obstruction.  Treatment was initially with combination of long-acting opioids and short-acting opioids, transition to PCA due to poor pain control and eventually switched to hydromorphone orally.  Treatment was complicated by recurrent episodes of hypoxic/hypercarbic respiratory failure likely due to delayed absorption of the opioid medications  from the malfunctioning digestive tract.  Towards the end of hospitalization, pain control remains suboptimal, we were avoiding intensification of the opioid doses with agreement of the family to avoid respiratory decompensation.  **Diabetes mellitus: The patient was receiving sliding scale insulin throughout the admission to control elevated blood glucose levels that resulted from administration of chemotherapy and steroids.  **Electrolytes/Fluids:             Hyponatremia: Complex hyponatremia due to intravascular volume depletion, pain, nausea, metastatic disease in the liver.  Sodium has normalized             Hypokalemia: Potassium has dropped likely due to increased urine output and insufficient oral intake.  Replaced with IV KCl/KPhos, resolved without recurrence.  Improved potassium after replacement. --Volume was managed with scheduled furosemide administration with additional intermittent doses based on changes of daily weight.  **Community-acquired pneumonia/sepsis: Diagnosis was made on admission due to  febrile illness.  Initially, patient was treated with parenteral antibiotics with cefepime and vancomycin.  Once patient defervesced, antibiotics were discontinued without recurrence of fever.   Condition at Discharge:    --Deceased  Ardath Sax 04-07-17

## 2017-03-28 NOTE — Progress Notes (Signed)
Pt daughter called requesting melatonin for pt. Made daughter aware that doctor will be called to see if this is possible.

## 2017-03-28 DEATH — deceased

## 2017-04-10 ENCOUNTER — Other Ambulatory Visit: Payer: Self-pay | Admitting: Nurse Practitioner

## 2017-04-24 ENCOUNTER — Telehealth: Payer: Self-pay | Admitting: Family Medicine

## 2017-04-24 NOTE — Telephone Encounter (Signed)
Had to wait until at least April 01, 2017 to re-fax [facility] reconsideration form due to having to wait for claim to process for 30 days for CT scan of abdomen and pelvis on DOS 01/29/2017. I have faxed this form along with clinical notes from [place] with Dr. Nolon Rod on 02/22/2017 to [phone].

## 2018-01-01 LAB — BLOOD GAS, ARTERIAL
DRAWN BY: 257701
FIO2: 100
O2 Saturation: 99.1 %
PATIENT TEMPERATURE: 98.2
PO2 ART: 228 mmHg — AB (ref 83.0–108.0)
pH, Arterial: 7.135 — CL (ref 7.350–7.450)

## 2019-09-07 IMAGING — DX DG ABD PORTABLE 1V
1 series · 1 of 1 positions shown · non-contrast
Comparison: No prior abdominal radiograph. CT the abdomen and
pelvis 02/13/2017.

CLINICAL DATA: 67-year-old female with history of nausea, vomiting
and abdominal pain with worsening shortness of breath.

EXAM:
PORTABLE ABDOMEN - 1 VIEW

[abdomen kub]
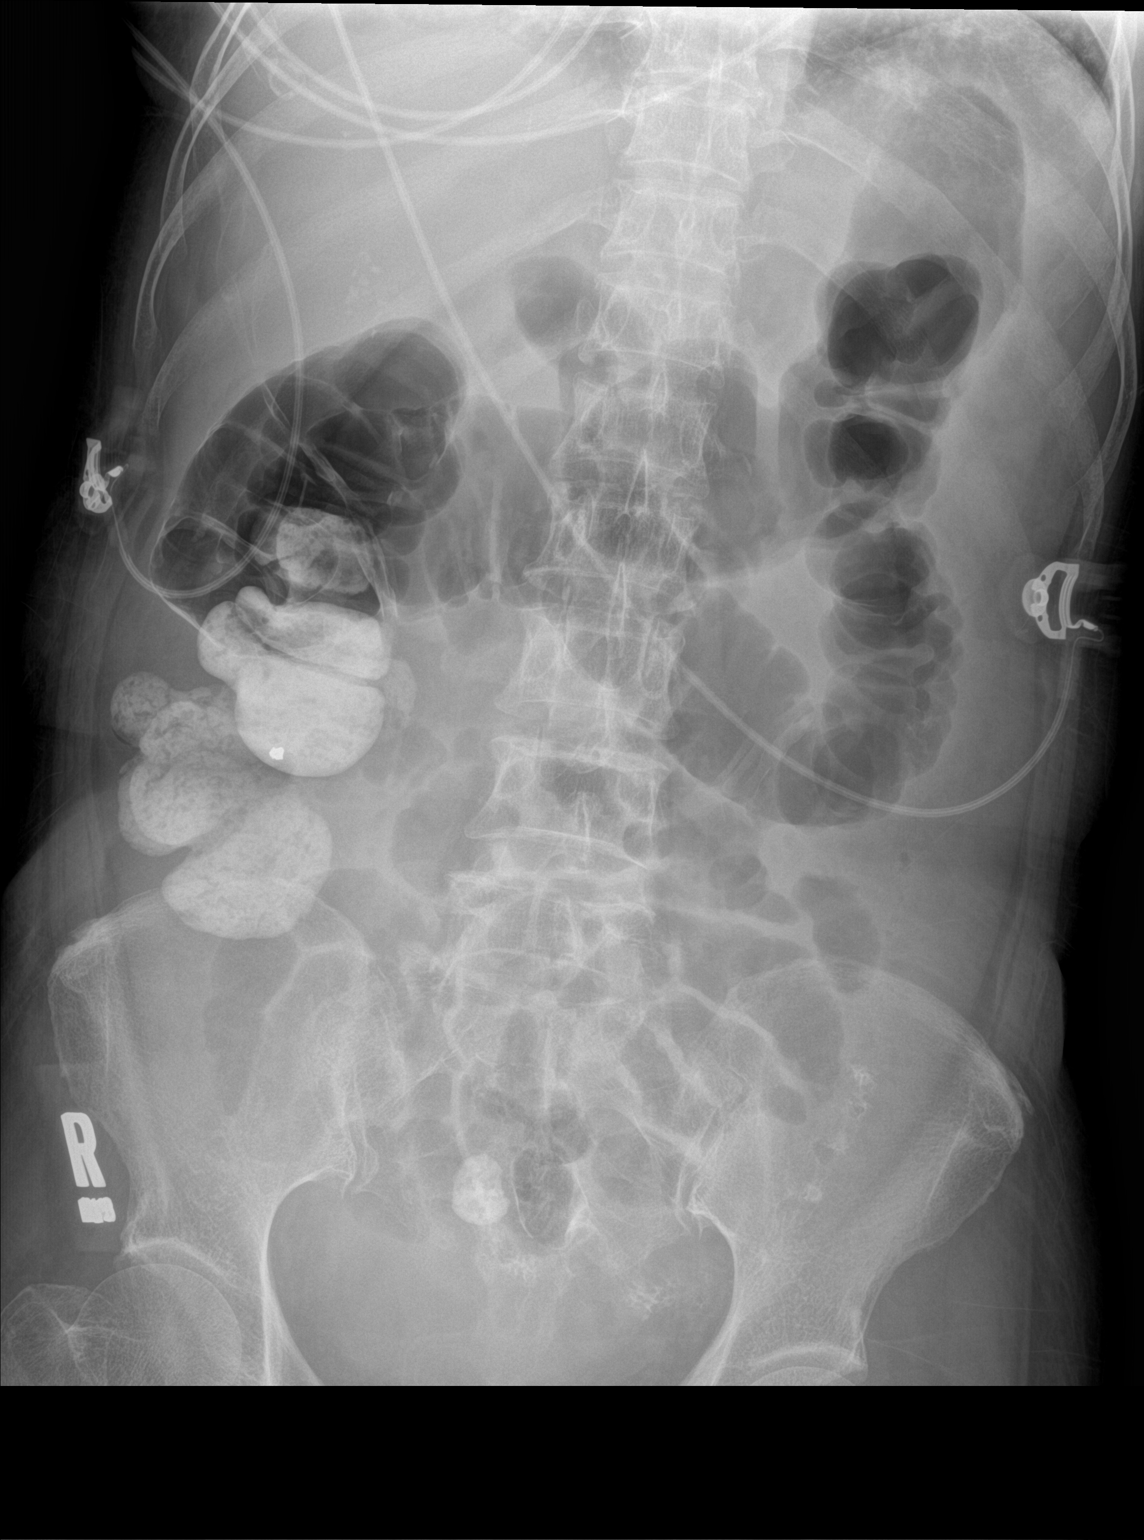

[1 of 1 positions shown; findings below may reference images not displayed]

FINDINGS: Gas and stool are noted throughout the colon. No pathologic
dilatation of small bowel or colon. Several nondilated gas-filled
loops of small bowel are noted (nonspecific). No definite
pneumoperitoneum noted on this single supine image. Retained
contrast material is evident in the cecum. Faint calcifications
projecting over the right upper quadrant corresponding to
calcifications in known hepatic mass.
IMPRESSION: 1. Nonobstructive bowel gas pattern.
2. No pneumoperitoneum.

## 2019-09-12 IMAGING — US IR PARACENTESIS
1 series · 2 of 2 positions shown · non-contrast
Comparison: none

INDICATION: Metastatic adenocarcinoma, likely primary peritoneal in origin,
recurrent malignant ascites. Request made for therapeutic
paracentesis.

[Series 1: ir (id) (id)/(id)/(id) ir · 2 of 2 slices shown]
[im 1/2]
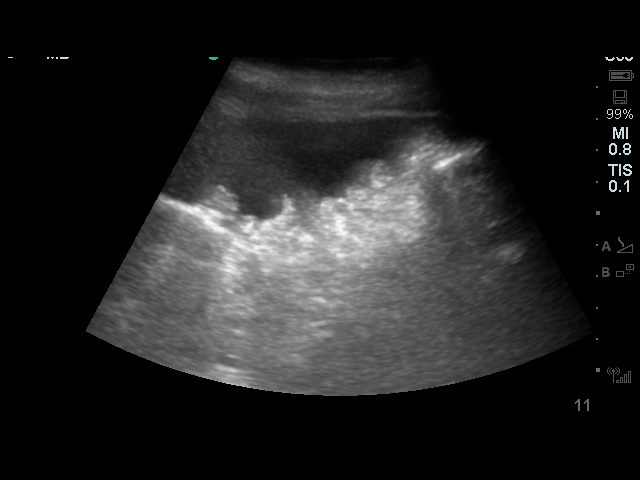
[im 2/2]
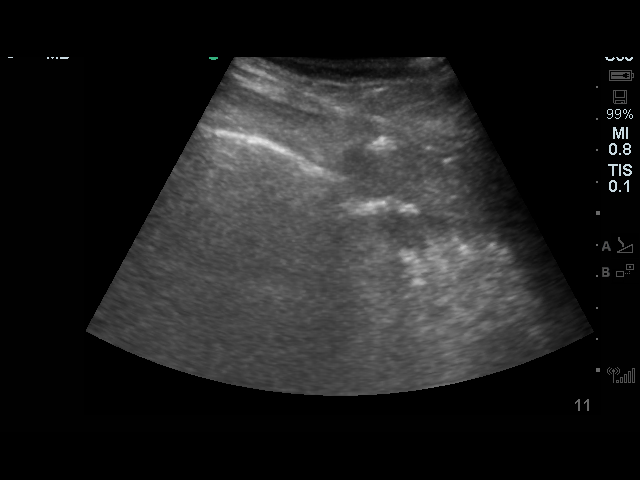

[2 of 2 positions shown; findings below may reference images not displayed]

EXAM:
ULTRASOUND GUIDED THERAPEUTIC PARACENTESIS

MEDICATIONS:
None.

COMPLICATIONS:
None immediate.

PROCEDURE:
Informed written consent was obtained from the patient after a
discussion of the risks, benefits and alternatives to treatment. A
timeout was performed prior to the initiation of the procedure.

Initial ultrasound scanning demonstrates a small to moderate amount
of ascites within the left mid to lower abdominal quadrant. The left
mid to lower abdomen was prepped and draped in the usual sterile
fashion. 1% lidocaine was used for local anesthesia.

Following this, a Yueh catheter was introduced. An ultrasound image
was saved for documentation purposes. The paracentesis was
performed. The catheter was removed and a dressing was applied. The
patient tolerated the procedure well without immediate post
procedural complication.
FINDINGS: A total of approximately 1.9 liters of yellow fluid was removed.
IMPRESSION: Successful ultrasound-guided therapeutic paracentesis yielding
liters of peritoneal fluid.
